# Patient Record
Sex: Female | Born: 1945 | Race: White | Hispanic: No | Smoking: Current every day smoker
Health system: Southern US, Community
[De-identification: ages and names within clinical notes are randomized; demographics above are authoritative.]

## PROBLEM LIST (undated history)

## (undated) DIAGNOSIS — J40 Bronchitis, not specified as acute or chronic: Secondary | ICD-10-CM

## (undated) DIAGNOSIS — I10 Essential (primary) hypertension: Secondary | ICD-10-CM

## (undated) DIAGNOSIS — D509 Iron deficiency anemia, unspecified: Secondary | ICD-10-CM

## (undated) DIAGNOSIS — E785 Hyperlipidemia, unspecified: Secondary | ICD-10-CM

## (undated) DIAGNOSIS — K269 Duodenal ulcer, unspecified as acute or chronic, without hemorrhage or perforation: Secondary | ICD-10-CM

## (undated) DIAGNOSIS — F419 Anxiety disorder, unspecified: Secondary | ICD-10-CM

## (undated) DIAGNOSIS — J449 Chronic obstructive pulmonary disease, unspecified: Secondary | ICD-10-CM

## (undated) DIAGNOSIS — I714 Abdominal aortic aneurysm, without rupture, unspecified: Secondary | ICD-10-CM

## (undated) DIAGNOSIS — K259 Gastric ulcer, unspecified as acute or chronic, without hemorrhage or perforation: Secondary | ICD-10-CM

## (undated) DIAGNOSIS — I712 Thoracic aortic aneurysm, without rupture, unspecified: Secondary | ICD-10-CM

## (undated) DIAGNOSIS — K529 Noninfective gastroenteritis and colitis, unspecified: Secondary | ICD-10-CM

## (undated) HISTORY — DX: Thoracic aortic aneurysm, without rupture, unspecified: I71.20

## (undated) HISTORY — PX: CAROTID ENDARTERECTOMY: SUR193

## (undated) HISTORY — DX: Essential (primary) hypertension: I10

## (undated) HISTORY — DX: Thoracic aortic aneurysm, without rupture: I71.2

## (undated) HISTORY — DX: Iron deficiency anemia, unspecified: D50.9

## (undated) HISTORY — DX: Hyperlipidemia, unspecified: E78.5

---

## 2002-07-24 ENCOUNTER — Emergency Department (HOSPITAL_COMMUNITY): Admission: EM | Admit: 2002-07-24 | Discharge: 2002-07-24 | Payer: Self-pay | Admitting: Emergency Medicine

## 2002-11-02 ENCOUNTER — Ambulatory Visit (HOSPITAL_COMMUNITY): Admission: RE | Admit: 2002-11-02 | Discharge: 2002-11-02 | Payer: Self-pay | Admitting: Unknown Physician Specialty

## 2002-11-21 ENCOUNTER — Inpatient Hospital Stay (HOSPITAL_COMMUNITY): Admission: RE | Admit: 2002-11-21 | Discharge: 2002-11-22 | Payer: Self-pay | Admitting: Vascular Surgery

## 2002-11-21 ENCOUNTER — Encounter (INDEPENDENT_AMBULATORY_CARE_PROVIDER_SITE_OTHER): Payer: Self-pay | Admitting: *Deleted

## 2003-01-08 ENCOUNTER — Emergency Department (HOSPITAL_COMMUNITY): Admission: EM | Admit: 2003-01-08 | Discharge: 2003-01-09 | Payer: Self-pay | Admitting: Emergency Medicine

## 2003-06-30 ENCOUNTER — Emergency Department (HOSPITAL_COMMUNITY): Admission: EM | Admit: 2003-06-30 | Discharge: 2003-06-30 | Payer: Self-pay | Admitting: Emergency Medicine

## 2003-11-12 ENCOUNTER — Ambulatory Visit: Payer: Self-pay | Admitting: Family Medicine

## 2004-01-13 ENCOUNTER — Ambulatory Visit: Payer: Self-pay | Admitting: Family Medicine

## 2004-01-20 ENCOUNTER — Ambulatory Visit: Payer: Self-pay | Admitting: Family Medicine

## 2004-01-27 ENCOUNTER — Ambulatory Visit: Payer: Self-pay | Admitting: Family Medicine

## 2004-02-03 ENCOUNTER — Ambulatory Visit: Payer: Self-pay | Admitting: Family Medicine

## 2004-02-10 ENCOUNTER — Ambulatory Visit: Payer: Self-pay | Admitting: Family Medicine

## 2004-03-09 ENCOUNTER — Ambulatory Visit: Payer: Self-pay | Admitting: Family Medicine

## 2004-03-20 ENCOUNTER — Ambulatory Visit: Payer: Self-pay | Admitting: Family Medicine

## 2004-03-27 ENCOUNTER — Ambulatory Visit: Payer: Self-pay | Admitting: Family Medicine

## 2004-04-03 ENCOUNTER — Ambulatory Visit: Payer: Self-pay | Admitting: Family Medicine

## 2004-04-09 ENCOUNTER — Ambulatory Visit: Payer: Self-pay | Admitting: Family Medicine

## 2004-04-14 ENCOUNTER — Ambulatory Visit: Payer: Self-pay | Admitting: Family Medicine

## 2004-04-28 ENCOUNTER — Ambulatory Visit: Payer: Self-pay | Admitting: Family Medicine

## 2004-05-08 ENCOUNTER — Ambulatory Visit: Payer: Self-pay | Admitting: Family Medicine

## 2004-05-11 ENCOUNTER — Ambulatory Visit: Payer: Self-pay | Admitting: Family Medicine

## 2004-06-02 ENCOUNTER — Ambulatory Visit: Payer: Self-pay | Admitting: Family Medicine

## 2004-06-09 ENCOUNTER — Ambulatory Visit: Payer: Self-pay | Admitting: Family Medicine

## 2004-07-09 ENCOUNTER — Ambulatory Visit: Payer: Self-pay | Admitting: Family Medicine

## 2004-08-10 ENCOUNTER — Ambulatory Visit: Payer: Self-pay | Admitting: Family Medicine

## 2004-08-24 ENCOUNTER — Ambulatory Visit: Payer: Self-pay | Admitting: Family Medicine

## 2004-08-31 ENCOUNTER — Ambulatory Visit (HOSPITAL_COMMUNITY): Admission: RE | Admit: 2004-08-31 | Discharge: 2004-08-31 | Payer: Self-pay | Admitting: Family Medicine

## 2004-09-10 ENCOUNTER — Ambulatory Visit: Payer: Self-pay | Admitting: Family Medicine

## 2004-09-30 ENCOUNTER — Ambulatory Visit: Payer: Self-pay | Admitting: Family Medicine

## 2004-10-09 ENCOUNTER — Ambulatory Visit: Payer: Self-pay | Admitting: Family Medicine

## 2004-11-09 ENCOUNTER — Ambulatory Visit: Payer: Self-pay | Admitting: Family Medicine

## 2004-12-09 ENCOUNTER — Ambulatory Visit: Payer: Self-pay | Admitting: Family Medicine

## 2005-01-11 ENCOUNTER — Ambulatory Visit: Payer: Self-pay | Admitting: Family Medicine

## 2005-02-11 ENCOUNTER — Ambulatory Visit: Payer: Self-pay | Admitting: Family Medicine

## 2005-03-11 ENCOUNTER — Ambulatory Visit: Payer: Self-pay | Admitting: Family Medicine

## 2005-04-13 ENCOUNTER — Ambulatory Visit: Payer: Self-pay | Admitting: Family Medicine

## 2005-04-28 ENCOUNTER — Ambulatory Visit: Payer: Self-pay | Admitting: Family Medicine

## 2005-05-04 ENCOUNTER — Ambulatory Visit: Payer: Self-pay | Admitting: Family Medicine

## 2005-05-10 ENCOUNTER — Ambulatory Visit: Payer: Self-pay | Admitting: Family Medicine

## 2005-05-17 ENCOUNTER — Ambulatory Visit: Payer: Self-pay | Admitting: Family Medicine

## 2005-05-24 ENCOUNTER — Ambulatory Visit: Payer: Self-pay | Admitting: Family Medicine

## 2005-05-26 ENCOUNTER — Ambulatory Visit: Payer: Self-pay | Admitting: Family Medicine

## 2005-06-01 ENCOUNTER — Ambulatory Visit: Payer: Self-pay | Admitting: Family Medicine

## 2005-06-08 ENCOUNTER — Encounter: Admission: RE | Admit: 2005-06-08 | Discharge: 2005-06-17 | Payer: Self-pay | Admitting: Orthopaedic Surgery

## 2005-06-22 ENCOUNTER — Ambulatory Visit: Payer: Self-pay | Admitting: Internal Medicine

## 2005-06-30 ENCOUNTER — Encounter (INDEPENDENT_AMBULATORY_CARE_PROVIDER_SITE_OTHER): Payer: Self-pay | Admitting: *Deleted

## 2005-06-30 ENCOUNTER — Ambulatory Visit: Payer: Self-pay | Admitting: Internal Medicine

## 2005-06-30 ENCOUNTER — Ambulatory Visit (HOSPITAL_COMMUNITY): Admission: RE | Admit: 2005-06-30 | Discharge: 2005-06-30 | Payer: Self-pay | Admitting: Internal Medicine

## 2005-07-08 ENCOUNTER — Ambulatory Visit: Payer: Self-pay | Admitting: Family Medicine

## 2005-07-12 ENCOUNTER — Ambulatory Visit: Payer: Self-pay | Admitting: Family Medicine

## 2005-08-09 ENCOUNTER — Ambulatory Visit: Payer: Self-pay | Admitting: Family Medicine

## 2005-08-12 ENCOUNTER — Ambulatory Visit: Payer: Self-pay | Admitting: Family Medicine

## 2005-08-25 ENCOUNTER — Ambulatory Visit: Payer: Self-pay | Admitting: Family Medicine

## 2005-09-02 ENCOUNTER — Ambulatory Visit: Payer: Self-pay | Admitting: Family Medicine

## 2005-09-09 ENCOUNTER — Ambulatory Visit: Payer: Self-pay | Admitting: Family Medicine

## 2005-09-16 ENCOUNTER — Ambulatory Visit: Payer: Self-pay | Admitting: Family Medicine

## 2005-10-18 ENCOUNTER — Ambulatory Visit: Payer: Self-pay | Admitting: Family Medicine

## 2005-11-08 ENCOUNTER — Ambulatory Visit: Payer: Self-pay | Admitting: Physician Assistant

## 2005-11-17 ENCOUNTER — Ambulatory Visit: Payer: Self-pay | Admitting: Physician Assistant

## 2005-12-21 ENCOUNTER — Ambulatory Visit: Payer: Self-pay | Admitting: Family Medicine

## 2005-12-23 ENCOUNTER — Ambulatory Visit: Payer: Self-pay | Admitting: Family Medicine

## 2006-01-25 ENCOUNTER — Ambulatory Visit: Payer: Self-pay | Admitting: Family Medicine

## 2006-02-14 ENCOUNTER — Ambulatory Visit: Payer: Self-pay | Admitting: Family Medicine

## 2006-02-22 ENCOUNTER — Ambulatory Visit: Payer: Self-pay | Admitting: Family Medicine

## 2006-02-25 ENCOUNTER — Ambulatory Visit: Payer: Self-pay | Admitting: Family Medicine

## 2006-03-30 ENCOUNTER — Ambulatory Visit: Payer: Self-pay | Admitting: Family Medicine

## 2006-05-03 ENCOUNTER — Ambulatory Visit: Payer: Self-pay | Admitting: Family Medicine

## 2006-06-03 ENCOUNTER — Ambulatory Visit: Payer: Self-pay | Admitting: Family Medicine

## 2006-11-01 ENCOUNTER — Ambulatory Visit: Payer: Self-pay | Admitting: Vascular Surgery

## 2010-05-19 NOTE — Procedures (Signed)
CAROTID DUPLEX EXAM   INDICATION:  Followup of known carotid artery disease.  Patient is  asymptomatic.   HISTORY:  Diabetes:  No.  Cardiac:  Heart murmur.  Hypertension:  Yes.  Smoking:  Yes, one pack per day.  Previous Surgery:  Right CEA on 11/21/02 by Dr. Hart Rochester.  CV History:  Amaurosis Fugax No, Paresthesias No, Hemiparesis No                                       RIGHT             LEFT  Brachial systolic pressure:         164               160  Brachial Doppler waveforms:         WNL               WNL  Vertebral direction of flow:        Antegrade         Antegrade  DUPLEX VELOCITIES (cm/sec)  CCA peak systolic                   63                70  ECA peak systolic                   73                253  ICA peak systolic                   83                240  ICA end diastolic                   26                57  PLAQUE MORPHOLOGY:                  Intimal thickening                  Mixed calcific  PLAQUE AMOUNT:                      Minimal           Moderate-to-severe  PLAQUE LOCATION:                    Bifurcation, ICA  Bifurcation, ICA,  ECA   IMPRESSION:  1. Right ICA, status post CEA, without recurrent stenosis.  2. 60-79% left internal carotid artery stenosis.  3. Left external carotid artery stenosis.  4. Study essentially unchanged from that on 10/19/2005.   ___________________________________________  Quita Skye. Hart Rochester, M.D.   PB/MEDQ  D:  11/01/2006  T:  11/01/2006  Job:  [id number]

## 2010-05-22 NOTE — Discharge Summary (Signed)
NAME:  Jackie Walls, Jackie Walls                         ACCOUNT NO.:  [id number]   MEDICAL RECORD NO.:  [id number]                   PATIENT TYPE:  INP   LOCATION:  3303                                 FACILITY:  [facility]   PHYSICIAN:  Quita Skye. Hart Rochester, M.D.               DATE OF BIRTH:  22-Jun-1945   DATE OF ADMISSION:  11/21/2002  DATE OF DISCHARGE:  11/22/2002                                 DISCHARGE SUMMARY   DIAGNOSIS:  Right carotid stenosis.   PAST MEDICAL HISTORY:  1. Hypertension.  2. Hyperlipidemia.  3. History of claudication.  4. Carotid stenosis.   Patient denies any history of diabetes mellitus, myocardial infarction, or  stroke.   ALLERGIES:  No known drug allergies.   DISCHARGE DIAGNOSIS:  Right carotid stenosis, status post right carotid  endarterectomy with Dacron patch angioplasty.   BRIEF HISTORY:  This is a 65 year old female with a history of carotid bruit  which was further evaluated with a carotid duplex exam.  Carotid duplex  revealed an 80-90% right internal carotid artery stenosis and a 60-79% left  internal carotid artery stenosis.  Patient had been complaining of neck and  back pain at the time of evaluation.  Patient denied any neurological  symptoms such as amaurosis fugax, previous CVA, TIA, diplopia, blurred  vision, or syncope.  The patient had been seen and evaluated by Dr. Hart Rochester,  who felt that she should proceed with a right carotid endarterectomy.   HOSPITAL COURSE:  Patient was admitted and taken to the OR on November 21, 2002, for a right carotid endarterectomy with Dacron patch angioplasty.  Patient tolerated the procedure well.  Patient was hemodynamically stable  immediately postoperatively and was extubated without problems.  The patient  woke up from anesthesia neurologically intact.  Patient's postoperative  course was uneventful, and she progressed rapidly.  On the date of  discharge, postoperative day #1, the patient was without  complaints.  She  was afebrile.  Vital signs were stable.   PHYSICAL EXAMINATION:  CARDIAC:  Regular rate and rhythm.  LUNGS:  Clear to auscultation bilaterally.  ABDOMEN:  Soft, nontender, nondistended.  There were bowel sounds present.  NEUROLOGIC:  Patient was intact.  SKIN:  The incision was clean, dry, and intact.  There was no evidence of  hematoma or infection.   The patient has progressed well and is felt to be stable for discharge at  this time.  She is able to eat and ambulate without problems.  The patient  did have a hypokalemia, and this was replaced on the date of surgery with IV  potassium and then on the date of discharge with oral potassium.   LABORATORIES:  CBC on November 22, 2002:  White count 7.4, hemoglobin 10.2,  hematocrit 29.5.  Platelets 444.  BMP on November 22, 2002:  Sodium 137,  potassium 3.6, BUN 5, creatinine 0.8, glucose 92.   CONDITION  ON DISCHARGE:  Improved.   INSTRUCTIONS:  Patient is to resume her home medications.  She was given a  prescription for Tylox to be taken 1-2 p.o. q.4-6h. p.r.n. pain.   ACTIVITY:  No heavy lifting, pulling, or pushing.  No strenuous activity.  She is to refrain from driving until followup with Dr. Hart Rochester.  Patient is  to continue daily breathing and walking exercises.   DIET:  Low fat, low cholesterol, and low sodium.   WOUND CARE:  Patient may shower daily.  She is to clean the incisions with  soap and water and do not put creams, lotions, or any ointments on the  incisions.  The patient is to notify [facility] if she experiences any redness,  swelling, or drainage from the incision or if she experiences a fever of 101  degrees Fahrenheit.   FOLLOW UP:  Patient has a follow-up appointment with Dr. Hart Rochester at the [facility]  office on December 11, 2002 at 9:30 a.m.      Pecola Leisure, PA                      Quita Skye. Hart Rochester, M.D.    AY/MEDQ  D:  11/22/2002  T:  11/23/2002  Job:  [id number]   cc:   Colon Flattery, MD   [address]  [place]  [place] [address]  Fax: [phone]

## 2010-05-22 NOTE — H&P (Signed)
NAME:  Jackie Walls, Jackie Walls                         ACCOUNT NO.:  [id number]   MEDICAL RECORD NO.:  [id number]                   PATIENT TYPE:  INP   LOCATION:  NA                                   FACILITY:  [facility]   PHYSICIAN:  Quita Skye. Hart Rochester, M.D.               DATE OF BIRTH:  [birth year]   DATE OF ADMISSION:  DATE OF DISCHARGE:                                HISTORY & PHYSICAL   HISTORY OF PRESENT ILLNESS:  This is a 65 year old female with a history of  carotid bruit which was further evaluated with a carotid duplex exam.  This  exam showed an 80-90% right internal carotid stenosis and a 60-79% left  internal carotid stenosis.  The patient complained of neck and back pain at  that time.  She denied any neurologic symptoms, specifically, amaurosis  fugax, previous CVA, TIA, diplopia, blurred vision, or syncope.  The patient  has been seen and evaluated by Dr. Hart Rochester, who discussed the risks,  benefits, and complications of carotid endarterectomy.  Patient was approved  for Medicaid and wished to proceed for surgical repair of her vascular  disease.  Patient presents today for preoperative history and physical  assessment.   DRUG SENSITIVITIES:  No known drug allergies.   CURRENT MEDICATIONS:  1. Accuretic 20/25 mg p.o. daily.  2. Lipitor 20 mg p.o. daily.  3. Aspirin 325 mg p.o. daily.  4. Combivent inhaler p.r.n.   PAST MEDICAL HISTORY:  1. Hypertension.  2. Hyperlipidemia.  3. History of claudication.  4. Patient specifically denies any history of diabetes mellitus, MI, stroke.   SOCIAL HISTORY:  The patient worked in the [employer] in the past but  currently is not working.  She is married and has five children, of which  the youngest is 3 years of age.  The patient has a 37-pack-year history of  smoking, which she is currently continuing.  The patient denies any history  of alcohol use.   FAMILY HISTORY:  Noncontributory.   PERTINENT REVIEW OF SYSTEMS:  At one  point, review of systems was completed  and positive for the following:  HEENT:  The patient admits to occasional  headaches and dizziness which has been long term or transient.  The patient  wear glasses.  The patient has a history of sinusitis in the past.  MUSCULOSKELETAL:  The patient admits to arthritis in her knees and her back  in addition to night cramps in her calves.  CARDIOVASCULAR:  The patient  admits to claudication for several years.  PULMONARY:  The patient admits to  shortness of breath, occasional wheezes, and cough, which is mostly in the  morning.  The patient specifically denies any cough, fever, chills, night  sweats, weight loss, or other cold-like symptoms.  The patient denies any  diarrhea or constipation.  The patient denies any urinary frequency,  urgency, or dysuria.  The patient denies  any hematochezia or melena.   PHYSICAL EXAMINATION:  GENERAL:  This is a well-developed, well-nourished  female who appears older than stated age.  VITAL SIGNS:  Blood pressure in her right arm is 178/98.  Blood pressure in  her left arm is 168/90.  Her pulse is 90 and regular.  Respirations are 20  and unlabored.  Weight is 134 pounds.  She is 5'7.  SKIN:  Thin with fair turgor.  HEENT:  EOMI.  Conjunctivae are clear.  Sclerae are anicteric.  PERRLA.  Hearing is within normal limits.  Patient has no teeth in her upper palate  and has poor dentition in her lower teeth.  She has no history of wearing  dentures.  She has no dentures.  Patient has no oral sores or exudate.  CHEST:  The patient's lungs are clear to auscultation throughout without  wheezes, crackles, or rales.  She has no spinal or CVA tenderness.  HEART:  Regular rate and rhythm without murmur, rub or gallop.  She has a  normal S1 and S2.  She has a harsh carotid bruit bilaterally with the right  being greater than the left.  Her carotid pulses are 3+ bilaterally.  Radial  pulses are 3+ bilaterally.  Ulnar pulses  are 2+ bilaterally.  Her right  femoral pulse is nonpalpable as well as the remainder of her distal pulses  on the right.  Her left femoral pulse is 1+ with nonpalpable pulses in the  distal extremity.  She has minimal evidence of venous insufficiencies.  She  has no evidence of ischemia or ulceration in her lower extremities.  ABDOMEN:  Soft, nontender, nondistended with good bowel sounds.  She has a  harsh abdominal bruit and pulsation.  This is nonradiating.  She has no  organomegaly or masses.  SKELETAL/NEUROMUSCULAR:  She has 5/5 strength throughout in the upper and  lower extremities.  Her cranial nerves II-XII are intact.   IMPRESSION:  This is a 65 year old female with a long-term history of  smoking with severe right internal carotid artery stenosis and evidence of  an additional peripheral vascular disease.   PLAN:  We will continue as planned with a right carotid endarterectomy with  Dr. Hart Rochester on November 21, 2002.  We will address her other issues of  peripheral vascular disease at a later date.      Skyrah A. Eustaquio Boyden.                  Quita Skye Hart Rochester, M.D.    CAF/MEDQ  D:  11/19/2002  T:  11/19/2002  Job:  [id number]   cc:   Colon Flattery, MD  [address]  [place]  [place] [address]  Fax: [phone]

## 2010-05-22 NOTE — Consult Note (Signed)
NAME:  LAFERN, BRINKLEY NO.:  [id number]   MEDICAL RECORD NO.:  [id number]           PATIENT TYPE:   LOCATION:                                 FACILITY:   PHYSICIAN:  R. Roetta Sessions, M.D. DATE OF BIRTH:  1945/05/14   DATE OF CONSULTATION:  06/22/2005  DATE OF DISCHARGE:                                   CONSULTATION   CHIEF COMPLAINT:  Blood in stool, anemia and diarrhea, weight loss.   PHYSICIAN REQUESTING CONSULTATION:  Delaney Meigs, M.D.   HISTORY OF PRESENT ILLNESS:  Ms. Tsao is a 65 year old lady who presents  for further evaluation of the above-stated symptoms.  She says that she has  had diarrhea for 5-6 months.  She also has had anemia for several months as  well.  She is having anywhere from 3 to 4 loose postprandial stools daily.  She denies any nocturnal symptoms.  She never has a solid stool anymore.  She has intermittent left lower quadrant abdominal pain.  Her weight is down  14-15 pounds in the last few months.  Denies any melena or rectal bleeding,  but her stools are dark since she started taking iron almost a year ago.  She is on Coumadin for a history of peripheral vascular disease.  She is  status post right carotid endarterectomy, and her left carotid artery is 70%  blocked per her report.  She complains of early satiety and dysphagia to  pills.  She feels like she has a lump in her throat.  Denies any typical  heartburn symptoms.  She has intermittent vomiting, which is very occasional  in occurrence.  She has never had EGD or colonoscopy.   CURRENT MEDICATIONS:  1.  Benicar 40 mg daily.  2.  Lotrel 10/20 mg daily.  3.  Doxazosin 4 mg daily.  4.  Lipitor 80 mg daily.  5.  Coumadin 5 mg on Monday, Wednesday, Friday, and 10 mg other days.  6.  Fosamax 70 mg weekly.  7.  Hematin 3 daily.  8.  Benadryl p.r.n.   ALLERGIES:  CODEINE causes shortness of breath, NAPROSYN causes rash, and  SKELAXIN.   PAST MEDICAL HISTORY:  1.   Osteoporosis.  2.  Hypertension.  3.  Hypercholesterolemia.  4.  COPD.  5.  Chronic back pain.  6.  B12 deficiency on monthly injections.  7.  Status post right carotid endarterectomy.  8.  She reports history of heart murmur.   FAMILY HISTORY:  Her mother died of MI at age 71.  Father died of pneumonia  at age 27.  Negative for a family history of colorectal cancer.   SOCIAL HISTORY:  She is married, has 5 children.  She is a housewife.  She  smokes half a pack of cigarettes daily.  Denies any alcohol use.   REVIEW OF SYSTEMS:  GASTROINTESTINAL:  See HPI.  CONSTITUTIONAL:  See HPI.  CARDIOPULMONARY:  Denies any chest pain.  GENITOURINARY:  Denies any dysuria  or hematuria.   PHYSICAL EXAMINATION:  VITAL SIGNS:  Weight 135, height 5 feet 5 inches.  Temperature 98.3, blood pressure 142/88, pulse 74.  GENERAL:  A pleasant, thin, elderly Caucasian female in no acute distress.  SKIN:  Warm and dry, no jaundice.  HEENT:  Sclerae nonicteric.  Oropharyngeal mucosa moist and pink.  No  lesions, erythema or exudate.  Dentition in poor repair.  NECK:  No lymphadenopathy or thyromegaly.  CHEST:  Lungs are clear to auscultation.  CARDIAC:  Regular rate and rhythm, normal S1, S2.  Faint systolic ejection  murmur heard best at the apex.  ABDOMEN:  Positive bowel sounds, soft, nondistended.  She has mild left  lower quadrant tenderness to deep palpation.  No organomegaly or masses.  No  rebound tenderness or guarding.  She has abdominal bruits in both the left  and right midabdomen.  No abdominal hernias.  EXTREMITIES:  No edema.   LABORATORY DATA:  On April 28, 2005, he hemoglobin was 12.7, hematocrit  38.7, MCV 89.2.  Iron was 28, iron saturation was 8%, TIBC 362.  INR 2.6.  LFTs normal.  Methylmalonic acid was mildly elevated at 0.22.  Repeat CBC on  May 10, 2005, revealed a hemoglobin at 10.9, hematocrit 34.5.   IMPRESSION:  Ms. Acord is a 65 year old lady with chronic iron-deficiency   anemia in the setting of heme-positive stools and chronic diarrhea with  weight loss.  She is on chronic anticoagulation therapy for what she says is  peripheral vascular disease.  The scenario is somewhat worrisome.  Cannot  exclude the possibility of colorectal cancer, although with ongoing Fosamax  use esophageal pill dysphagia and early satiety, may also have esophageal  stricture, erosive esophagitis or even peptic ulcer disease.  Etiology of  chronic occult gastrointestinal bleeding needs to be determined.  She has  never had an EGD or colonoscopy.  Recommend one at this time.  She reports  that she generally holds her Coumadin for any procedures or dental work.   PLAN:  1.  CBC to see where we stand with her hemoglobin.  2.  EGD and colonoscopy with possible esophageal dilatation.  3.  Will hold her Coumadin for 4 days and iron 7 days to procedures.  4.  She will need SBE prophylaxis with antibiotic therapy.  5.  Not mentioned above, she also has abdominal bruits on exam.  She is not      sure whether she has had her renal arteries assessed previously given      her peripheral vascular disease.  Would like for her to address this      with Dr. Lysbeth Galas, or we can re-address after above workup is complete.      Tana Coast, P.AJonathon Bellows, M.D.  Electronically Signed    LL/MEDQ  D:  06/22/2005  T:  06/22/2005  Job:  [id number]   cc:   Delaney Meigs, M.D.  Fax: [phone]

## 2010-05-22 NOTE — Op Note (Signed)
NAME:  UZMA, HELLMER               ACCOUNT NO.:  [id number]   MEDICAL RECORD NO.:  [id number]           PATIENT TYPE:  AMB   LOCATION:                                FACILITY:  [facility]   PHYSICIAN:  R. Roetta Sessions, M.D. DATE OF BIRTH:  12-12-1945   DATE OF PROCEDURE:  06/30/2005  DATE OF DISCHARGE:                                 OPERATIVE REPORT   PROCEDURE:  Esophagogastroduodenoscopy with small-bowel biopsy followed by  colonoscopy with biopsy, and stool collection.   ENDOSCOPIST:  Gerrit Friends. Rourk, M.D.   INDICATIONS FOR PROCEDURE:  The patient is a 65 year old lady with  hemodynamic stool, iron-deficiency anemia, diarrhea and weight loss.  She  has been on anticoagulation and her Coumadin has been held for 5 days.  She  is undergoing EGD and colonoscopy.  She is status post vague esophageal  dysphagia to solids as well and takes Fosamax.  This approach has been  discussed with the patient at length.  The potential risks, benefits, and  alternatives have been reviewed; questions answered.  She is agreeable.  Please see the documentation in the medical record.   PROCEDURE NOTE:  O2 saturation, blood pressure, pulse, and respirations were  monitored throughout the entirety of both procedures.   CONSCIOUS SEDATION FOR BOTH PROCEDURES:  Versed 7 mg IV, Demerol 125 mg IV  in divided doses.   SBE PROPHYLAXIS:  Ampicillin 2 gm IV, gentamicin 90 mg IV prior to the  procedures.   INSTRUMENT:  Olympus video chip system.   FINDINGS:  EGD examination of the tubular esophagus revealed an area of  distal esophageal erosion with some depth consistent with ulceration.  It  was friable, but not bleeding.  There was also a Schatzki ring at the same  level.  The esophageal mucosa, otherwise, appeared normal.  The EG junction  was easily traversed.   STOMACH:  The gastric cavity was empty.  It insufflated well with air.  A  thorough examination of the gastric mucosa including a retroflex view  of the  proximal stomach and esophagogastric junction demonstrated a small hiatal  hernia.  The pylorus was patent and easily traversed.  Examination of the bulb, second, and third portions of the duodenum revealed  no mucosal abnormalities.   THERAPEUTIC/DIAGNOSTIC MANEUVERS PERFORMED:  A 56 French Maloney dilator was  passed to full insertion.  A look back revealed no apparent complication  related to passage of the dilator.  Subsequent biopsy of the second and  third portion of the duodenum were taken for histologic study.  The patient  tolerated the procedure and was prepared for coloscopy.  A digital rectal  exam revealed no abnormalities.   ENDOSCOPIC FINDINGS:  The prep was adequate.   RECTUM:  Examination of the rectal mucosa including the retroflex view of  the anal verge revealed only internal hemorrhoids.   COLON:  The colonic mucosa was surveyed from the rectosigmoid junction  through the left transverse and right colon to the area of the appendiceal  orifice, ileocecal valve, and cecum.  These structures were well seen and  photographed for the record.  The terminal ileum was intubated to 5 cm.   From this level the scope was slowly withdrawn.  All previously mentioned  mucosal surfaces were again seen.  The colonic mucosa was well seen and  appeared normal except for a 1 cm friable, stellate shaped ulcer at the  ileocecal valve.  Please see photos. It had a benign appearance. The  terminal ileum mucosa, itself, appeared entirely normal.  The ileocecal  valve ulcer was biopsied for histologic study.  Also biopsies of the sigmoid  colon were taken to rule out microscopic colitis.  Otherwise, the colonic  mucosa appeared normal.  The patient tolerated both procedures well and was  reacted in endoscopy.   EGD IMPRESSION:  1.  Distal esophageal erosion, ulceration consistent with erosive reflux      esophagitis component of Fosamax-induce injury not excluded.  2.   Schatzki ring, status post dilation.  3.  Small hiatal hernia, otherwise normal stomach, D1-D3.  4.  Status post biopsies D2-D3.   COLONOSCOPY FINDINGS:  1.  Internal hemorrhoids, otherwise normal rectum.  2.  The colon appeared normal except for a 1 cm stellate-shaped ulcer at      ileocecal valve, biopsied.  3.  Normal terminal ileum.   I am suspicious the lesion in the ileocecal valve is related to Fosamax and  also concerned about the inflammatory process at the EG junction.   RECOMMENDATIONS:  1.  On the basis of the distal esophageal lesion alone, I have recommended      that the Fosamax be stopped immediately.  Will begin Aciphex 20 mg      orally daily.  2.  Followup path not mentioned above.  3.  Stool sample collected for microbiology studies given her history of      diarrhea.  It is notable that __________ may be related to her diarrhea      as well.  4.  Antireflux literature provided to Ms. Baldonado.  5.  I have advised her that she may resume her Coumadin tomorrow.  6.  Further recommendations to follow.      Jonathon Bellows, M.D.  Electronically Signed     RMR/MEDQ  D:  06/30/2005  T:  06/30/2005  Job:  [id number]   cc:   Delaney Meigs, M.D.  Fax: [phone]

## 2010-05-22 NOTE — Op Note (Signed)
NAME:  Jackie Walls, Jackie Walls                         ACCOUNT NO.:  [id number]   MEDICAL RECORD NO.:  [id number]                   PATIENT TYPE:  INP   LOCATION:  3303                                 FACILITY:  [facility]   PHYSICIAN:  Quita Skye. Hart Rochester, M.D.               DATE OF BIRTH:  05/13/1945   DATE OF PROCEDURE:  11/21/2002  DATE OF DISCHARGE:                                 OPERATIVE REPORT   PREOPERATIVE DIAGNOSIS:  Severe right internal carotid artery stenosis,  asymptomatic.   POSTOPERATIVE DIAGNOSIS:  Severe right internal carotid artery stenosis,  asymptomatic.   OPERATION PERFORMED:  Right carotid endarterectomy with Dacron patch  angioplasty.   SURGEON:  Quita Skye. Hart Rochester, M.D.   ASSISTANT:  Jerold Coombe, P.A.   ANESTHESIA:  General endotracheal.   INDICATIONS FOR PROCEDURE:  The patient was found to have a carotid bruit  and duplex scanning revealed a severe right internal carotid artery stenosis  approximating 80% with mild disease in the contralateral left side.  She had  no history of stroke or transient ischemic attacks.  She was scheduled for  right carotid endarterectomy for this asymptomatic lesion for prevention of  stroke.   DESCRIPTION OF PROCEDURE:  The patient was taken to the operating room and  placed in the supine position at which time satisfactory general  endotracheal anesthesia was administered.  The right neck was prepped with  Betadine scrub and solution and draped in routine sterile manner.  An  incision was made along the anterior border of the sternocleidomastoid  muscle and carried down through subcutaneous tissue and platysma using the  Bovie.  The common facial vein, external jugular veins were ligated with 3-0  silk ties and divided exposing the common, internal and external carotid  arteries.  Care was taken not to injure the vagus or hypoglossal nerves both  of which were exposed.  There was a calcified atherosclerotic plaque at the  carotid bifurcation extending up the internal carotid artery about 3 to 4cm.  There was an aberrant branch arising from the internal carotid artery about  3 cm distal to the origin right where the plaque terminated.  This arose  lateral to the internal carotid artery and was of fairly significant size  and was preserved.  The plaque extended down the common carotid artery a  fairly long distance and was dissected free down proximal to the crossing of  the omohyoid muscle.  A #10 shunt was prepared and the patient was  heparinized.  Carotid vessels were occluded with vascular clamps.  Longitudinal opening made in the common carotid with a 15 blade extended at  the internal carotid artery with Potts scissors to a point distal to the  disease.  Plaque was mildly ulcerated and about 80% stenotic. The plaque  proximally was still present in the common carotid artery although it was  not causing any significant stenosis.  There  was some significant thickening  of the vessel in the proximal extent of the arteriotomy.  A #10 shunt was  inserted without difficulty, re-establishing flow in about two minutes.  A  standard endarterectomy was then performed using the elevator and the Pott's  scissors with eversion endarterectomy of the external carotid. The plaque  feathered off the distal internal carotid artery nicely.  A few tacking  sutures were placed adjacent to the origin of this small branch to tack down  the distal intima.  A Dacron patch was then sewn into place and prior to  completion of this, the shunt was removed and following appropriate  flushing, the closure was completed to re-establish flow at the external and  at the internal branch.  The carotid was  occluded for less than two minutes on removal of the shunt.  Protamine was  then given to reverse the heparin.  Following adequate hemostasis the wound  was irrigated with saline and closed in layers of Vicryl in a subcuticular   fashion.  Sterile dressing was applied.  The patient was taken to the  recovery room in satisfactory condition.                                               Quita Skye Hart Rochester, M.D.    JDL/MEDQ  D:  11/21/2002  T:  11/22/2002  Job:  [id number]

## 2010-10-29 ENCOUNTER — Encounter: Payer: Self-pay | Admitting: Internal Medicine

## 2010-10-30 ENCOUNTER — Institutional Professional Consult (permissible substitution): Payer: Self-pay | Admitting: Internal Medicine

## 2012-01-29 ENCOUNTER — Inpatient Hospital Stay (HOSPITAL_COMMUNITY)
Admission: EM | Admit: 2012-01-29 | Discharge: 2012-01-31 | DRG: 194 | Disposition: A | Discharge status: Home / Self Care | Payer: Medicare Other | Attending: Internal Medicine | Admitting: Internal Medicine

## 2012-01-29 ENCOUNTER — Emergency Department (HOSPITAL_COMMUNITY): Payer: Medicare Other

## 2012-01-29 ENCOUNTER — Encounter (HOSPITAL_COMMUNITY): Payer: Self-pay | Admitting: *Deleted

## 2012-01-29 DIAGNOSIS — E785 Hyperlipidemia, unspecified: Secondary | ICD-10-CM | POA: Insufficient documentation

## 2012-01-29 DIAGNOSIS — Z23 Encounter for immunization: Secondary | ICD-10-CM

## 2012-01-29 DIAGNOSIS — Z79899 Other long term (current) drug therapy: Secondary | ICD-10-CM

## 2012-01-29 DIAGNOSIS — J309 Allergic rhinitis, unspecified: Secondary | ICD-10-CM | POA: Diagnosis present

## 2012-01-29 DIAGNOSIS — J189 Pneumonia, unspecified organism: Secondary | ICD-10-CM

## 2012-01-29 DIAGNOSIS — I10 Essential (primary) hypertension: Secondary | ICD-10-CM

## 2012-01-29 DIAGNOSIS — J4489 Other specified chronic obstructive pulmonary disease: Secondary | ICD-10-CM | POA: Diagnosis present

## 2012-01-29 DIAGNOSIS — T502X5A Adverse effect of carbonic-anhydrase inhibitors, benzothiadiazides and other diuretics, initial encounter: Secondary | ICD-10-CM | POA: Diagnosis present

## 2012-01-29 DIAGNOSIS — J40 Bronchitis, not specified as acute or chronic: Secondary | ICD-10-CM | POA: Insufficient documentation

## 2012-01-29 DIAGNOSIS — J449 Chronic obstructive pulmonary disease, unspecified: Secondary | ICD-10-CM

## 2012-01-29 DIAGNOSIS — E871 Hypo-osmolality and hyponatremia: Secondary | ICD-10-CM | POA: Diagnosis present

## 2012-01-29 HISTORY — DX: Bronchitis, not specified as acute or chronic: J40

## 2012-01-29 LAB — BASIC METABOLIC PANEL
BUN: 17 mg/dL (ref 6–23)
CO2: 24 mEq/L (ref 19–32)
Calcium: 9.1 mg/dL (ref 8.4–10.5)
Chloride: 92 mEq/L — ABNORMAL LOW (ref 96–112)
Creatinine, Ser: 0.95 mg/dL (ref 0.50–1.10)
GFR calc Af Amer: 71 mL/min — ABNORMAL LOW (ref >90)
GFR calc non Af Amer: 61 mL/min — ABNORMAL LOW (ref >90)
Glucose, Bld: 102 mg/dL — ABNORMAL HIGH (ref 70–99)
Potassium: 3 mEq/L — ABNORMAL LOW (ref 3.5–5.1)
Sodium: 128 mEq/L — ABNORMAL LOW (ref 135–145)

## 2012-01-29 LAB — CBC WITH DIFFERENTIAL/PLATELET
Basophils Absolute: 0 10*3/uL (ref 0.0–0.1)
Basophils Relative: 0 % (ref 0–1)
Eosinophils Absolute: 0 10*3/uL (ref 0.0–0.7)
Eosinophils Relative: 0 % (ref 0–5)
HCT: 35.8 % — ABNORMAL LOW (ref 36.0–46.0)
Hemoglobin: 12.7 g/dL (ref 12.0–15.0)
Lymphocytes Relative: 7 % — ABNORMAL LOW (ref 12–46)
Lymphs Abs: 0.9 10*3/uL (ref 0.7–4.0)
MCH: 32.5 pg (ref 26.0–34.0)
MCHC: 35.5 g/dL (ref 30.0–36.0)
MCV: 91.6 fL (ref 78.0–100.0)
Monocytes Absolute: 0.7 10*3/uL (ref 0.1–1.0)
Monocytes Relative: 6 % (ref 3–12)
Neutro Abs: 10.6 10*3/uL — ABNORMAL HIGH (ref 1.7–7.7)
Neutrophils Relative %: 87 % — ABNORMAL HIGH (ref 43–77)
Platelets: 314 10*3/uL (ref 150–400)
RBC: 3.91 MIL/uL (ref 3.87–5.11)
RDW: 12.7 % (ref 11.5–15.5)
WBC: 12.2 10*3/uL — ABNORMAL HIGH (ref 4.0–10.5)

## 2012-01-29 LAB — CBC
HCT: 35.6 % — ABNORMAL LOW (ref 36.0–46.0)
MCHC: 35.4 g/dL (ref 30.0–36.0)
MCV: 91.8 fL (ref 78.0–100.0)
RDW: 12.8 % (ref 11.5–15.5)

## 2012-01-29 LAB — INFLUENZA PANEL BY PCR (TYPE A & B)
Influenza A By PCR: NEGATIVE
Influenza B By PCR: NEGATIVE

## 2012-01-29 LAB — D-DIMER, QUANTITATIVE: D-Dimer, Quant: 2.89 ug/mL-FEU — ABNORMAL HIGH (ref 0.00–0.48)

## 2012-01-29 MED ORDER — POTASSIUM CHLORIDE CRYS ER 20 MEQ PO TBCR
40.0000 meq | EXTENDED_RELEASE_TABLET | ORAL | Status: AC
  Administered 2012-01-29 – 2012-01-30 (×3): 40 meq via ORAL
  Filled 2012-01-29 (×3): qty 2

## 2012-01-29 MED ORDER — DEXTROSE 5 % IV SOLN
1.0000 g | INTRAVENOUS | Status: DC
  Administered 2012-01-30: 1 g via INTRAVENOUS
  Filled 2012-01-29 (×2): qty 10

## 2012-01-29 MED ORDER — BUDESONIDE-FORMOTEROL FUMARATE 160-4.5 MCG/ACT IN AERO
2.0000 | INHALATION_SPRAY | Freq: Two times a day (BID) | RESPIRATORY_TRACT | Status: DC
  Administered 2012-01-29 – 2012-01-31 (×4): 2 via RESPIRATORY_TRACT
  Filled 2012-01-29: qty 6

## 2012-01-29 MED ORDER — ALBUTEROL SULFATE (5 MG/ML) 0.5% IN NEBU: 2.5000 mg | INHALATION_SOLUTION | Freq: Three times a day (TID) | RESPIRATORY_TRACT | Status: DC

## 2012-01-29 MED ORDER — FLUTICASONE PROPIONATE 50 MCG/ACT NA SUSP
2.0000 | Freq: Every day | NASAL | Status: DC
  Administered 2012-01-29 – 2012-01-31 (×3): 2 via NASAL
  Filled 2012-01-29: qty 16

## 2012-01-29 MED ORDER — AZITHROMYCIN 250 MG PO TABS
500.0000 mg | ORAL_TABLET | Freq: Once | ORAL | Status: AC
  Administered 2012-01-29: 500 mg via ORAL
  Filled 2012-01-29: qty 2

## 2012-01-29 MED ORDER — SODIUM CHLORIDE 0.9 % IV SOLN
INTRAVENOUS | Status: DC
  Filled 2012-01-29 (×3): qty 1000

## 2012-01-29 MED ORDER — HYDROCODONE-ACETAMINOPHEN 5-325 MG PO TABS
1.0000 | ORAL_TABLET | Freq: Once | ORAL | Status: AC
  Administered 2012-01-29: 1 via ORAL
  Filled 2012-01-29: qty 1

## 2012-01-29 MED ORDER — ALBUTEROL SULFATE (5 MG/ML) 0.5% IN NEBU
5.0000 mg | INHALATION_SOLUTION | RESPIRATORY_TRACT | Status: DC
  Administered 2012-01-29 – 2012-01-31 (×12): 5 mg via RESPIRATORY_TRACT
  Filled 2012-01-29 (×3): qty 1
  Filled 2012-01-29: qty 0.5
  Filled 2012-01-29 (×2): qty 1
  Filled 2012-01-29: qty 0.5
  Filled 2012-01-29 (×2): qty 1
  Filled 2012-01-29: qty 0.5
  Filled 2012-01-29 (×2): qty 1
  Filled 2012-01-29: qty 0.5

## 2012-01-29 MED ORDER — HEPARIN SODIUM (PORCINE) 5000 UNIT/ML IJ SOLN
5000.0000 [IU] | Freq: Three times a day (TID) | INTRAMUSCULAR | Status: DC
  Administered 2012-01-29 – 2012-01-31 (×5): 5000 [IU] via SUBCUTANEOUS
  Filled 2012-01-29 (×5): qty 1

## 2012-01-29 MED ORDER — DEXTROSE 5 % IV SOLN
1.0000 g | Freq: Once | INTRAVENOUS | Status: AC
  Administered 2012-01-29: 1 g via INTRAVENOUS
  Filled 2012-01-29: qty 10

## 2012-01-29 MED ORDER — PANTOPRAZOLE SODIUM 40 MG PO TBEC
40.0000 mg | DELAYED_RELEASE_TABLET | Freq: Every day | ORAL | Status: DC
  Administered 2012-01-29 – 2012-01-31 (×3): 40 mg via ORAL
  Filled 2012-01-29 (×3): qty 1

## 2012-01-29 MED ORDER — DEXTROSE 5 % IV SOLN
500.0000 mg | INTRAVENOUS | Status: DC
  Administered 2012-01-30: 500 mg via INTRAVENOUS
  Filled 2012-01-29 (×2): qty 500

## 2012-01-29 MED ORDER — TIOTROPIUM BROMIDE MONOHYDRATE 18 MCG IN CAPS
18.0000 ug | ORAL_CAPSULE | Freq: Every day | RESPIRATORY_TRACT | Status: DC
  Administered 2012-01-30 – 2012-01-31 (×2): 18 ug via RESPIRATORY_TRACT
  Filled 2012-01-29: qty 5

## 2012-01-29 MED ORDER — DOXYCYCLINE HYCLATE 100 MG PO TABS
100.0000 mg | ORAL_TABLET | Freq: Two times a day (BID) | ORAL | Status: DC
  Administered 2012-01-29 – 2012-01-31 (×4): 100 mg via ORAL
  Filled 2012-01-29 (×4): qty 1

## 2012-01-29 MED ORDER — LISINOPRIL 10 MG PO TABS
20.0000 mg | ORAL_TABLET | Freq: Every day | ORAL | Status: DC
  Administered 2012-01-29 – 2012-01-31 (×3): 20 mg via ORAL
  Filled 2012-01-29 (×3): qty 2

## 2012-01-29 MED ORDER — AMLODIPINE BESYLATE 5 MG PO TABS
5.0000 mg | ORAL_TABLET | Freq: Every day | ORAL | Status: DC
  Administered 2012-01-29 – 2012-01-31 (×3): 5 mg via ORAL
  Filled 2012-01-29 (×3): qty 1

## 2012-01-29 MED ORDER — DOXAZOSIN MESYLATE 2 MG PO TABS
4.0000 mg | ORAL_TABLET | Freq: Every day | ORAL | Status: DC
  Administered 2012-01-29 – 2012-01-30 (×2): 4 mg via ORAL
  Filled 2012-01-29 (×2): qty 2

## 2012-01-29 MED ORDER — INFLUENZA VIRUS VACC SPLIT PF IM SUSP
0.5000 mL | INTRAMUSCULAR | Status: AC
  Administered 2012-01-30: 0.5 mL via INTRAMUSCULAR
  Filled 2012-01-29: qty 0.5

## 2012-01-29 MED ORDER — POTASSIUM CHLORIDE IN NACL 20-0.9 MEQ/L-% IV SOLN
INTRAVENOUS | Status: DC
  Administered 2012-01-29 – 2012-01-30 (×2): via INTRAVENOUS

## 2012-01-29 MED ORDER — DEXTROSE 5 % IV SOLN
INTRAVENOUS | Status: AC
  Filled 2012-01-29: qty 500

## 2012-01-29 MED ORDER — IBUPROFEN 800 MG PO TABS
400.0000 mg | ORAL_TABLET | ORAL | Status: DC | PRN
  Administered 2012-01-29: 400 mg via ORAL
  Filled 2012-01-29: qty 1

## 2012-01-29 MED ORDER — GUAIFENESIN ER 600 MG PO TB12
1200.0000 mg | ORAL_TABLET | Freq: Two times a day (BID) | ORAL | Status: DC
  Administered 2012-01-29 – 2012-01-31 (×4): 1200 mg via ORAL
  Filled 2012-01-29 (×8): qty 2

## 2012-01-29 MED ORDER — FLUTICASONE PROPIONATE 50 MCG/ACT NA SUSP
NASAL | Status: AC
  Filled 2012-01-29: qty 16

## 2012-01-29 NOTE — Progress Notes (Signed)
ANTIBIOTIC CONSULT NOTE - INITIAL  Pharmacy Consult for Renal adjustment antibiotics Indication: pneumonia  No Known Allergies  Patient Measurements: Height: 5\' 5"  (165.1 cm) Weight: 140 lb 1 oz (63.532 kg) IBW/kg (Calculated) : 57    Vital Signs: Temp: 97.9 F (36.6 C) (01/25 1113) Temp src: Oral (01/25 1113) BP: 160/78 mmHg (01/25 1607) Pulse Rate: 72  (01/25 1607) Intake/Output from previous day:   Intake/Output from this shift:    Labs:  Basename 01/29/12 1213  WBC 12.2*  HGB 12.7  PLT 314  LABCREA --  CREATININE 0.95   Estimated Creatinine Clearance: 52.4 ml/min (by C-G formula based on Cr of 0.95). No results found for this basename: VANCOTROUGH:2,VANCOPEAK:2,VANCORANDOM:2,GENTTROUGH:2,GENTPEAK:2,GENTRANDOM:2,TOBRATROUGH:2,TOBRAPEAK:2,TOBRARND:2,AMIKACINPEAK:2,AMIKACINTROU:2,AMIKACIN:2, in the last 72 hours   Microbiology: No results found for this or any previous visit (from the past 720 hour(s)).  Medical History: Past Medical History  Diagnosis Date  . Hypertension   . Hyperlipidemia   . ALLERGIC RHINITIS   . Bronchitis     Medications:  Scheduled:    . albuterol  5 mg Nebulization Q4H  . amLODipine  5 mg Oral Daily  . azithromycin  500 mg Intravenous Q24H  . [COMPLETED] azithromycin  500 mg Oral Once  . budesonide-formoterol  2 puff Inhalation BID  . [COMPLETED] cefTRIAXone (ROCEPHIN)  IV  1 g Intravenous Once  . cefTRIAXone (ROCEPHIN)  IV  1 g Intravenous Q24H  . doxazosin  4 mg Oral QHS  . doxycycline  100 mg Oral Q12H  . fluticasone  2 spray Each Nare Daily  . guaiFENesin  1,200 mg Oral BID  . heparin  5,000 Units Subcutaneous Q8H  . [COMPLETED] HYDROcodone-acetaminophen  1 tablet Oral Once  . lisinopril  20 mg Oral Daily  . tiotropium  18 mcg Inhalation Daily  . [DISCONTINUED] albuterol  2.5 mg Nebulization TID   Assessment: Patient on Azithromycin, Ceftriaxone, Doxycycline  Goal of Therapy:  Eradicate infection  Plan:  No renal  adjustment needed for antibiotics presently ordered Continue all doses and frequency as ordered  Raquel James, Marely Apgar Willeen Cass 01/29/2012,6:29 PM

## 2012-01-29 NOTE — H&P (Signed)
[facility] History and Physical  Jackie Walls NUU:[id number] DOB: 1945-09-05 DOA: 01/29/2012  Referring physician: Anitra Lauth, EDP PCP: Josue Hector, MD  Specialists: None  Chief Complaint: SOB and R sided rib pains  HPI: Jackie Walls is a 67 y.o. female who presented to [facility]-she states it started 2-3 days ago.  It seemed to come on by itself.  She stated she was just laying in bed at the time and "it started hurting real bad" She denies any specific SOB, no phlegm or any sick contacts.  NO flu like illnesses around her. She didn't try anything to make the pain better-She took some mucinex which seemed to help a little.   She states she does have a history of chronic bronchitis and that this may have happened maybe a month ago. Feels like she has had a low grade fever-maybe 100.6 that seems to have started   She reports coming to hosptial mainly becuas eshe was concerned about the pain in her chest and right side   Workup revealed WBC 12.2 hemoglobin 12.7 Sodium 120 potassium 3.0 BUN/creatinine 17/0.5 d-dimer 2.89 g glucose 102 2 view x-ray =patchy airspace disease with reticular nodular interstitial prominence in the right lower lobe and right upper lobe peripherally. Atypical infection or pneumonia cannot be excluded. Follow-up examination to assure resolution is recommended. Bony thorax is stable. Hyperinflation again noted  Review of Systems:  Denies burning in the urine, dark stools, tarry stool, blurred vision double vision weakness on any one side of body, not really sick contacts or anyone with other illnesses, no weakness on any one side of body no falls no chills/rigors  Past Medical History  Diagnosis Date  . Hypertension   . Hyperlipidemia   . ALLERGIC RHINITIS    Chart review  Admission 11.17.04 with RCAA stenosis-endarterectomy with dacron pathc performed  Seen by Dr. Jena Gauss of GI for some Blood in stool in a setting of PVD  Past Surgical History    Procedure Date  . Coroted artery     NECK  . Cesarean section    History   Social History Narrative   Lives with her husband at home.   No Known Allergies  Family History  Problem Relation Age of Onset  . Cancer Sister    Prior to Admission medications   Medication Sig Start Date End Date Taking? Authorizing Provider  albuterol (PROVENTIL HFA;VENTOLIN HFA) 108 (90 BASE) MCG/ACT inhaler Inhale 2 puffs into the lungs every 6 (six) hours as needed. For shortness of breath   Yes Historical Provider, MD  amLODipine (NORVASC) 5 MG tablet Take 5 mg by mouth daily.     Yes Historical Provider, MD  B Complex-C-Min-Fe-FA (HEMATINIC PLUS VIT/MINERALS PO) Take by mouth 2 (two) times daily.     Yes Historical Provider, MD  budesonide-formoterol (SYMBICORT) 160-4.5 MCG/ACT inhaler Inhale 2 puffs into the lungs 2 (two) times daily.     Yes Historical Provider, MD  cetirizine (ZYRTEC) 10 MG tablet Take 10 mg by mouth daily.     Yes Historical Provider, MD  doxazosin (CARDURA) 4 MG tablet Take 4 mg by mouth at bedtime.     Yes Historical Provider, MD  fluticasone (FLONASE) 50 MCG/ACT nasal spray Place 2 sprays into the nose daily as needed. For nasal symptoms   Yes Historical Provider, MD  hydrochlorothiazide (MICROZIDE) 12.5 MG capsule Take 12.5 mg by mouth daily.     Yes Historical Provider, MD  lisinopril (PRINIVIL,ZESTRIL) 20 MG tablet Take 20  mg by mouth daily.     Yes Historical Provider, MD  losartan (COZAAR) 50 MG tablet Take 100 mg by mouth daily.   Yes Historical Provider, MD  simvastatin (ZOCOR) 80 MG tablet Take 80 mg by mouth at bedtime.     Yes Historical Provider, MD  tiotropium (SPIRIVA) 18 MCG inhalation capsule Place 18 mcg into inhaler and inhale daily as needed.   Yes Historical Provider, MD   Physical Exam: Filed Vitals:   01/29/12 1113 01/29/12 1132 01/29/12 1401 01/29/12 1405  BP: 175/71  148/62   Pulse: 88  75   Temp: 97.9 F (36.6 C)     TempSrc: Oral     Resp: 20  18    Height: 5\' 5"  (1.651 m)     Weight: 63.532 kg (140 lb 1 oz)     SpO2: 95% 95% 87% 89%     General:  Alert pleasant oriented Caucasian female no apparent distress  Eyes: EOMI no pallor no icterus  ENT: Neck soft supple, throat edentulous poor dentition no redness to Bactrim  Neck: Supple  Cardiovascular: S1-S2 no murmur rub or gallop  Respiratory: Clinically clear no tactile vocal resonance or fremitus  Abdomen: Soft nontender nondistended  Skin: No lower extremity edema or pitting edema  Musculoskeletal: Range of motion seems intact  Psychiatric: Euthymic  Neurologic: Grossly intact motor. Sensory.  Labs on Admission:  Basic Metabolic Panel:  Lab 01/29/12 1610  NA 128*  K 3.0*  CL 92*  CO2 24  GLUCOSE 102*  BUN 17  CREATININE 0.95  CALCIUM 9.1  MG --  PHOS --   Liver Function Tests: No results found for this basename: AST:5,ALT:5,ALKPHOS:5,BILITOT:5,PROT:5,ALBUMIN:5 in the last 168 hours No results found for this basename: LIPASE:5,AMYLASE:5 in the last 168 hours No results found for this basename: AMMONIA:5 in the last 168 hours CBC:  Lab 01/29/12 1213  WBC 12.2*  NEUTROABS 10.6*  HGB 12.7  HCT 35.8*  MCV 91.6  PLT 314   Cardiac Enzymes: No results found for this basename: CKTOTAL:5,CKMB:5,CKMBINDEX:5,TROPONINI:5 in the last 168 hours  BNP (last 3 results) No results found for this basename: PROBNP:3 in the last 8760 hours CBG: No results found for this basename: GLUCAP:5 in the last 168 hours  Radiological Exams on Admission: Dg Chest 2 View  01/29/2012  *RADIOLOGY REPORT*  Clinical Data: Chest pain, shortness of breath  CHEST - 2 VIEW  Comparison: 05/24/2011  Findings: Cardiomediastinal silhouette is stable.  There is patchy airspace disease with reticular nodular interstitial prominence in the right lower lobe and right upper lobe peripherally.  Atypical infection or pneumonia cannot be excluded.  Follow-up examination to assure resolution is  recommended.  Bony thorax is stable. Hyperinflation again noted.  IMPRESSION: There is patchy airspace disease with reticular nodular interstitial prominence in the right lower lobe and right upper lobe peripherally.  Atypical infection or pneumonia cannot be excluded.  Follow-up examination to assure resolution is recommended.  Bony thorax is stable.  Hyperinflation again noted.   Original Report Authenticated By: Natasha Mead, M.D.     EKG: Independently reviewed. None performed  Assessment/Plan Principal Problem:  *CAP (community acquired pneumonia) Active Problems:  COPD (chronic obstructive pulmonary disease)  HTN (hypertension)   1. Likely community-acquired pneumonia, curb score=1, nevertheless Jackie Walls has what seems like a new oxygen requirement and I feel that inpatient management is appropriate to determine if she will have an oxygen needs. 2. Right-sided chest pain-more likely than not secondary to pleurisy. Would  not workup further even though d-dimer is elevated at this might be secondary to pneumonia 3. Likely COPD Gold stage 2-3-patient is a continuous smoke for almost 50 years-she's not willing to quit. We will get Dr. therapy involved, patient will benefit from when necessary albuterol nebs every 4 hourly, I will add a higher dose Mucinex 1200 mg twice a day-continue Symbicort 160-4.5 mcg twice a day and Spiriva 18 mcg daily 4. Hyponatremia potentially secondary to thiazide. As will be held, we will recheck basic metabolic panel in the morning 5. Hypertension patient has therapeutic duplication with losartan and lisinopril-I will hold her losartan and continue lisinopril 20. Continue doxazosin 4 mg each bedtime and amlodipine 5 mg.  none if consultant consulted, please document name and whether formally or informally consulted  Code Status: Full o) Family Communication: None at bedside  Disposition Plan:  Inpatient, Med-surg, 2-3 d  Time spent: 74  Rhetta Mura [facility] Pager [phone]  If 7PM-7AM, please contact night-coverage [email or web address] Password [facility] 01/29/2012, 3:02 PM

## 2012-01-29 NOTE — ED Notes (Addendum)
Pt co pain to right rib and thoracic back area, pain is worse with sitting and is worse with sleeping, denies any injury, denies any injury, admits to cough "at times" for a month, cough is productive with white phlegm production, pt does admit to becoming sob with "doing things" requiring her to use her inhaler more.

## 2012-01-29 NOTE — ED Notes (Signed)
Patient's O2 sat 87% on room air. Patient nonsymptomatic, per son patient has COPD. 2 liters of oxygen applied via [place]. O2 sat 94%. EDP made aware.

## 2012-01-29 NOTE — ED Provider Notes (Addendum)
History   This chart was scribed for Jackie Sprout, MD by Jackie Walls, ED Scribe. The patient was seen in room APA07/[facility]07. Patient's care was started at 1059.  CSN: [id number]  Arrival date & time 01/29/12  1059   First MD Initiated Contact with Patient 01/29/12 1122      Chief Complaint  Patient presents with  . Pain  . Shortness of Breath    Patient is a 67 y.o. female presenting with chest pain. The history is provided by the patient. No language interpreter was used.  Chest Pain The chest pain began 2 days ago. Chest pain occurs constantly. The chest pain is unchanged. The severity of the pain is moderate. The quality of the pain is described as aching. Primary symptoms include shortness of breath.     Jackie Walls is a 67 y.o. female with a h/o HTN and hyperlipidemia, who presents to the Emergency Department complaining of 2 days of recurrent, sudden onset, progressive, moderate right side chest pain and SOB. Pain is sharp and worse at night. Despite taking ASA, there has been no improvement. Pt currently uses Symbicort ihaler twice daily. No difficulty urinating, dysuria, back pain, fever, chills, or n/v/d. Pt is a current everyday smoker, denying alcohol and illicit drugs.Surgical Hx includes carotid artery repair.   Past Medical History  Diagnosis Date  . Hypertension   . Hyperlipidemia   . ALLERGIC RHINITIS     Past Surgical History  Procedure Date  . Coroted artery     NECK  . Cesarean section     Family History  Problem Relation Age of Onset  . Cancer Sister     History  Substance Use Topics  . Smoking status: Current Every Day Smoker  . Smokeless tobacco: Not on file  . Alcohol Use: No    Review of Systems  Respiratory: Positive for shortness of breath. Negative for chest tightness.   Cardiovascular: Positive for chest pain.  Genitourinary: Negative for dysuria.  All other systems reviewed and are negative.    Allergies  Review of patient's  allergies indicates no known allergies.  Home Medications   Current Outpatient Rx  Name  Route  Sig  Dispense  Refill  . AMLODIPINE BESYLATE 5 MG PO TABS   Oral   Take 5 mg by mouth daily.           Marland Kitchen HEMATINIC PLUS VIT/MINERALS PO   Oral   Take by mouth 2 (two) times daily.           . BUDESONIDE-FORMOTEROL FUMARATE 160-4.5 MCG/ACT IN AERO   Inhalation   Inhale 2 puffs into the lungs 2 (two) times daily.           Marland Kitchen CETIRIZINE HCL 10 MG PO TABS   Oral   Take 10 mg by mouth daily.           Marland Kitchen DOXAZOSIN MESYLATE 4 MG PO TABS   Oral   Take 4 mg by mouth at bedtime.           Marland Kitchen FLUTICASONE PROPIONATE 50 MCG/ACT NA SUSP   Nasal   Place 2 sprays into the nose daily.           Marland Kitchen HYDROCHLOROTHIAZIDE 12.5 MG PO CAPS   Oral   Take 12.5 mg by mouth daily.           Marland Kitchen LISINOPRIL 20 MG PO TABS   Oral   Take 20 mg by mouth daily.           Marland Kitchen  LOSARTAN POTASSIUM PO   Oral   Take 50 mg by mouth 2 (two) times daily.           Marland Kitchen SIMVASTATIN 80 MG PO TABS   Oral   Take 80 mg by mouth at bedtime.           . TRAMADOL HCL 50 MG PO TABS   Oral   Take 50 mg by mouth daily. Prn pain            BP 175/71  Pulse 88  Temp 97.9 F (36.6 C) (Oral)  Resp 20  Ht 5\' 5"  (1.651 m)  Wt 140 lb 1 oz (63.532 kg)  BMI 23.31 kg/m2  SpO2 95%  Physical Exam  Nursing note and vitals reviewed. Constitutional: She is oriented to person, place, and time. She appears well-developed and well-nourished. No distress.  HENT:  Head: Normocephalic and atraumatic.  Eyes: Conjunctivae normal and EOM are normal.  Neck: Neck supple. No tracheal deviation present.  Cardiovascular: Normal rate.   Murmur heard.  Systolic murmur is present with a grade of 3/6  Pulmonary/Chest: Effort normal. No respiratory distress.       Rales to right and upper lobes. Globally decreased breath sounds. Chest wall tenderness on the right.  Abdominal: She exhibits no distension.  Musculoskeletal:  Normal range of motion.  Neurological: She is alert and oriented to person, place, and time. No sensory deficit.  Skin: Skin is dry.  Psychiatric: She has a normal mood and affect. Her behavior is normal.    ED Course  Procedures DIAGNOSTIC STUDIES: Oxygen Saturation is 95% on room air, normal by my interpretation.    COORDINATION OF CARE: 11:22- Ordered DG Chest 2 View 1 time imaging. 11:24- Evaluated Pt. Pt is awake, alert, and without distress. 11:31- Ordered Basic metabolic panel, CBC with Differential, and D-dimer, quantitative Once. 11:33- Patient understand and agree with initial ED impression and plan with expectations set for ED visit.     Labs Reviewed  CBC WITH DIFFERENTIAL - Abnormal; Notable for the following:    WBC 12.2 (*)     HCT 35.8 (*)     Neutrophils Relative 87 (*)     Neutro Abs 10.6 (*)     Lymphocytes Relative 7 (*)     All other components within normal limits  BASIC METABOLIC PANEL - Abnormal; Notable for the following:    Sodium 128 (*)     Potassium 3.0 (*)     Chloride 92 (*)     Glucose, Bld 102 (*)     GFR calc non Af Amer 61 (*)     GFR calc Af Amer 71 (*)     All other components within normal limits  D-DIMER, QUANTITATIVE - Abnormal; Notable for the following:    D-Dimer, Quant 2.89 (*)     All other components within normal limits   Dg Chest 2 View  01/29/2012  *RADIOLOGY REPORT*  Clinical Data: Chest pain, shortness of breath  CHEST - 2 VIEW  Comparison: 05/24/2011  Findings: Cardiomediastinal silhouette is stable.  There is patchy airspace disease with reticular nodular interstitial prominence in the right lower lobe and right upper lobe peripherally.  Atypical infection or pneumonia cannot be excluded.  Follow-up examination to assure resolution is recommended.  Bony thorax is stable. Hyperinflation again noted.  IMPRESSION: There is patchy airspace disease with reticular nodular interstitial prominence in the right lower lobe and right  upper lobe peripherally.  Atypical infection or  pneumonia cannot be excluded.  Follow-up examination to assure resolution is recommended.  Bony thorax is stable.  Hyperinflation again noted.   Original Report Authenticated By: Natasha Mead, M.D.      No diagnosis found.    MDM   Patient presenting today with cough, shortness of breath and right-sided rib pain. Patient is a current smoker and uses inhalers regularly. She has no abdominal tenderness or urinary symptoms. She denies any infectious symptoms. Low suspicion for cardiac disease at this time.  PE versus COPD exacerbation versus infectious etiology versus spontaneous pneumothorax.  CBC, BMP, d-dimer, chest x-ray pending. Currently patient is not wheezing but does have globally decreased breath sounds but states that she feels at her baseline and does not need a breathing treatment  1:58 PM CBC with leukocytosis of 12,000, BMP is normal and elevated d-dimer. However chest x-ray shows multilobar pneumonia which would explain the elevated d-dimer and do not feel that patient needs to go on for CTA at this time. Patient treated with Rocephin and azithromycin. When discussing admission with the patient she would rather go home.  However here patient became hypoxic with O2 sats in the between 87 and 88. Discussed with her and will admit for further evaluation  I personally performed the services described in this documentation, which was scribed in my presence.  The recorded information has been reviewed and considered.    Jackie Sprout, MD 01/29/12 1235  Jackie Sprout, MD 01/29/12 1400  Jackie Sprout, MD 01/29/12 1452

## 2012-01-30 ENCOUNTER — Inpatient Hospital Stay (HOSPITAL_COMMUNITY): Payer: Medicare Other

## 2012-01-30 DIAGNOSIS — J449 Chronic obstructive pulmonary disease, unspecified: Secondary | ICD-10-CM

## 2012-01-30 DIAGNOSIS — I10 Essential (primary) hypertension: Secondary | ICD-10-CM

## 2012-01-30 LAB — COMPREHENSIVE METABOLIC PANEL
BUN: 16 mg/dL (ref 6–23)
CO2: 24 mEq/L (ref 19–32)
Chloride: 96 mEq/L (ref 96–112)
Creatinine, Ser: 0.78 mg/dL (ref 0.50–1.10)
GFR calc non Af Amer: 85 mL/min — ABNORMAL LOW (ref >90)
Glucose, Bld: 99 mg/dL (ref 70–99)
Total Bilirubin: 0.2 mg/dL — ABNORMAL LOW (ref 0.3–1.2)

## 2012-01-30 LAB — CBC WITH DIFFERENTIAL/PLATELET
Eosinophils Relative: 1 % (ref 0–5)
HCT: 32.5 % — ABNORMAL LOW (ref 36.0–46.0)
Hemoglobin: 11.3 g/dL — ABNORMAL LOW (ref 12.0–15.0)
Lymphocytes Relative: 9 % — ABNORMAL LOW (ref 12–46)
MCV: 92.6 fL (ref 78.0–100.0)
Monocytes Absolute: 0.6 10*3/uL (ref 0.1–1.0)
Monocytes Relative: 5 % (ref 3–12)
Neutro Abs: 9.7 10*3/uL — ABNORMAL HIGH (ref 1.7–7.7)
WBC: 11.3 10*3/uL — ABNORMAL HIGH (ref 4.0–10.5)

## 2012-01-30 LAB — STREP PNEUMONIAE URINARY ANTIGEN: Strep Pneumo Urinary Antigen: NEGATIVE

## 2012-01-30 MED ORDER — IOHEXOL 350 MG/ML SOLN
100.0000 mL | Freq: Once | INTRAVENOUS | Status: AC | PRN
  Administered 2012-01-30: 100 mL via INTRAVENOUS

## 2012-01-30 MED ORDER — BIOTENE DRY MOUTH MT LIQD
15.0000 mL | Freq: Two times a day (BID) | OROMUCOSAL | Status: DC
  Administered 2012-01-30 – 2012-01-31 (×2): 15 mL via OROMUCOSAL

## 2012-01-30 NOTE — Progress Notes (Signed)
     Subjective: This lady feels much improved. The history she gives me is of a sudden onset of right-sided pleuritic chest pain. She did not have much of a cough nor fever. She did feel dyspneic with the pleuritic chest pain. Chest x-ray was suggestive of possible right upper and lower lobe pneumonia.           Physical Exam: Blood pressure 121/42, pulse 78, temperature 98.1 F (36.7 C), temperature source Oral, resp. rate 20, height 5\' 5"  (1.651 m), weight 63.532 kg (140 lb 1 oz), SpO2 95.00%. She looks systemically well. She is not toxic or septic. Lung fields are entirely clear with no evidence of crackles, wheezes or bronchial breathing. There is no pleural rub. Heart sounds are present and the are not tachycardic at rest. She is in sinus rhythm. She is alert and orientated.   Investigations:  No results found for this or any previous visit (from the past 240 hour(s)).   Basic Metabolic Panel:  Basename 01/30/12 0620 01/29/12 1709 01/29/12 1213  NA 129* -- 128*  K 3.7 -- 3.0*  CL 96 -- 92*  CO2 24 -- 24  GLUCOSE 99 -- 102*  BUN 16 -- 17  CREATININE 0.78 0.82 --  CALCIUM 8.7 -- 9.1  MG -- -- --  PHOS -- -- --   Liver Function Tests:  [facility] 01/30/12 0620  AST 14  ALT 14  ALKPHOS 151*  BILITOT 0.2*  PROT 5.7*  ALBUMIN 2.3*     CBC:  Basename 01/30/12 0620 01/29/12 1709 01/29/12 1213  WBC 11.3* 14.4* --  NEUTROABS 9.7* -- 10.6*  HGB 11.3* 12.6 --  HCT 32.5* 35.6* --  MCV 92.6 91.8 --  PLT 323 314 --    Dg Chest 2 View  01/29/2012  *RADIOLOGY REPORT*  Clinical Data: Chest pain, shortness of breath  CHEST - 2 VIEW  Comparison: 05/24/2011  Findings: Cardiomediastinal silhouette is stable.  There is patchy airspace disease with reticular nodular interstitial prominence in the right lower lobe and right upper lobe peripherally.  Atypical infection or pneumonia cannot be excluded.  Follow-up examination to assure resolution is recommended.  Bony thorax is  stable. Hyperinflation again noted.  IMPRESSION: There is patchy airspace disease with reticular nodular interstitial prominence in the right lower lobe and right upper lobe peripherally.  Atypical infection or pneumonia cannot be excluded.  Follow-up examination to assure resolution is recommended.  Bony thorax is stable.  Hyperinflation again noted.   Original Report Authenticated By: Natasha Mead, M.D.       Medications: I have reviewed the patient's current medications.  Impression: 1. Right-sided pleuritic chest pain. Probable community-acquired pneumonia. I am also somewhat suspicious of a pulmonary embolism. 2. COPD, stable. 3. Hypertension, stable. 4.Hyponatremia secondary to medications, stable.     Plan: 1. CT angiogram of the chest stat to make sure this patient does not have a pulmonary embolism. Otherwise continue current therapy.     LOS: 1 day   Wilson Singer Pager [phone]  01/30/2012, 11:03 AM

## 2012-01-31 LAB — LEGIONELLA ANTIGEN, URINE

## 2012-01-31 MED ORDER — BENZONATATE 100 MG PO CAPS
200.0000 mg | ORAL_CAPSULE | Freq: Three times a day (TID) | ORAL | Status: DC | PRN
  Administered 2012-01-31: 200 mg via ORAL
  Filled 2012-01-31: qty 2

## 2012-01-31 MED ORDER — LEVOFLOXACIN 500 MG PO TABS: 500.0000 mg | ORAL_TABLET | Freq: Every day | ORAL | Status: AC

## 2012-01-31 MED ORDER — BENZONATATE 200 MG PO CAPS: 200.0000 mg | ORAL_CAPSULE | Freq: Three times a day (TID) | ORAL | Status: DC | PRN

## 2012-01-31 NOTE — Progress Notes (Signed)
Discharge instructions given to pt with teach back given to RN. Pt. Taken to car via W/C. 

## 2012-01-31 NOTE — Discharge Summary (Signed)
Physician Discharge Summary  IRA BUSBIN ZHY:[id number] DOB: [birth year] DOA: 01/29/2012  PCP: Josue Hector, MD  Admit date: 01/29/2012 Discharge date: 01/31/2012  Time spent: Greater than 30 minutes  Recommendations for Outpatient Follow-up:  1. Follow with primary care physician in the next couple of weeks.   Discharge Diagnoses:  1. Right upper and right lower lobe community-acquired pneumonia. Improving clinically. 2. COPD, stable. 3. Hypertension.   Discharge Condition: Stable and improved.  Diet recommendation: Regular.  Filed Weights   01/29/12 1113  Weight: 63.532 kg (140 lb 1 oz)    History of present illness:  This very pleasant 67 year old lady presents to the hospital with symptoms right-sided chest pain and dyspnea. Please see initial history as outlined below: HPI: Jackie Walls is a 67 y.o. female who presented to [facility]-she states it started 2-3 days ago. It seemed to come on by itself. She stated she was just laying in bed at the time and "it started hurting real bad" She denies any specific SOB, no phlegm or any sick contacts. NO flu like illnesses around her.  She didn't try anything to make the pain better-She took some mucinex which seemed to help a little.  She states she does have a history of chronic bronchitis and that this may have happened maybe a month ago.  Feels like she has had a low grade fever-maybe 100.6 that seems to have started  She reports coming to hosptial mainly becuas eshe was concerned about the pain in her chest and right side  Workup revealed WBC 12.2 hemoglobin 12.7  Sodium 120 potassium 3.0 BUN/creatinine 17/0.5 d-dimer 2.89 g glucose 102  2 view x-ray =patchy airspace disease with reticular nodular interstitial prominence in the right lower lobe and right upper lobe peripherally. Atypical infection or pneumonia cannot be excluded. Follow-up examination to assure resolution is recommended. Bony thorax is stable. Hyperinflation  again noted  Hospital Course:  The patient was admitted and started on intravenous antibiotics and oxygen as required. She is doing extremely well and feels much improved in the last 24 hours. Because of her history of sudden onset of right-sided pleuritic chest pain, and elevated d-dimer, a CT of the chest was done yesterday. This was negative for pulmonary embolism. It did, however, show the right upper and lower lobe pneumonia. Patient is keen to go home today. She has been on 2 L of oxygen by nasal cannula. We will see if she qualifies for home oxygen. She will need followup appointment with her primary care physician in the next couple weeks and repeat chest x-ray in 4 weeks to make sure there is clearing of her pneumonia.  Procedures:  None.   Consultations:  None.  Discharge Exam: Filed Vitals:   01/31/12 0320 01/31/12 0500 01/31/12 0806 01/31/12 0943  BP:  149/50  158/74  Pulse:  79    Temp:  98.2 F (36.8 C)    TempSrc:  Oral    Resp:  20    Height:      Weight:      SpO2: 94% 97% 97%     General: She looks systemically well. She is not toxic or septic. Heart sounds are present without gallop rhythm.  Lung fields are clear with just a few crackles in the right mid and lower zones. There is no bronchial breathing. There is no wheezing. She is alert and orientated.  Discharge Instructions  Discharge Orders    Future Orders Please Complete By Expires   Diet -  low sodium heart healthy      Increase activity slowly          Medication List     As of 01/31/2012 11:04 AM    TAKE these medications         albuterol 108 (90 BASE) MCG/ACT inhaler   Commonly known as: PROVENTIL HFA;VENTOLIN HFA   Inhale 2 puffs into the lungs every 6 (six) hours as needed. For shortness of breath      amLODipine 5 MG tablet   Commonly known as: NORVASC   Take 5 mg by mouth daily.      benzonatate 200 MG capsule   Commonly known as: TESSALON   Take 1 capsule (200 mg total) by mouth  3 (three) times daily as needed for cough.      budesonide-formoterol 160-4.5 MCG/ACT inhaler   Commonly known as: SYMBICORT   Inhale 2 puffs into the lungs 2 (two) times daily.      cetirizine 10 MG tablet   Commonly known as: ZYRTEC   Take 10 mg by mouth daily.      doxazosin 4 MG tablet   Commonly known as: CARDURA   Take 4 mg by mouth at bedtime.      fluticasone 50 MCG/ACT nasal spray   Commonly known as: FLONASE   Place 2 sprays into the nose daily as needed. For nasal symptoms      HEMATINIC PLUS VIT/MINERALS PO   Take by mouth 2 (two) times daily.      hydrochlorothiazide 12.5 MG capsule   Commonly known as: MICROZIDE   Take 12.5 mg by mouth daily.      levofloxacin 500 MG tablet   Commonly known as: LEVAQUIN   Take 1 tablet (500 mg total) by mouth daily.      lisinopril 20 MG tablet   Commonly known as: PRINIVIL,ZESTRIL   Take 20 mg by mouth daily.      losartan 50 MG tablet   Commonly known as: COZAAR   Take 100 mg by mouth daily.      simvastatin 80 MG tablet   Commonly known as: ZOCOR   Take 80 mg by mouth at bedtime.      tiotropium 18 MCG inhalation capsule   Commonly known as: SPIRIVA   Place 18 mcg into inhaler and inhale daily as needed.          The results of significant diagnostics from this hospitalization (including imaging, microbiology, ancillary and laboratory) are listed below for reference.    Significant Diagnostic Studies: Dg Chest 2 View  01/29/2012  *RADIOLOGY REPORT*  Clinical Data: Chest pain, shortness of breath  CHEST - 2 VIEW  Comparison: 05/24/2011  Findings: Cardiomediastinal silhouette is stable.  There is patchy airspace disease with reticular nodular interstitial prominence in the right lower lobe and right upper lobe peripherally.  Atypical infection or pneumonia cannot be excluded.  Follow-up examination to assure resolution is recommended.  Bony thorax is stable. Hyperinflation again noted.  IMPRESSION: There is patchy  airspace disease with reticular nodular interstitial prominence in the right lower lobe and right upper lobe peripherally.  Atypical infection or pneumonia cannot be excluded.  Follow-up examination to assure resolution is recommended.  Bony thorax is stable.  Hyperinflation again noted.   Original Report Authenticated By: Natasha Mead, M.D.    Ct Angio Chest Pe W/cm &/or Wo Cm  01/30/2012  *RADIOLOGY REPORT*  Clinical Data: Shortness of breath.  Elevated D-dimer.  CT ANGIOGRAPHY CHEST  Technique:  Multidetector CT imaging of the chest using the standard protocol during bolus administration of intravenous contrast. Multiplanar reconstructed images including MIPs were obtained and reviewed to evaluate the vascular anatomy.  Contrast: OMNIPAQUE IOHEXOL 350 MG/ML SOLN  Comparison: None.  Findings: Contrast opacification of the pulmonary arteries is good respiratory motion blurred many of the images.  Overall, the study is of moderate to good diagnostic quality.  No filling defects within either main pulmonary artery or their branches in either lung to suggest pulmonary embolism.  Heart size upper normal.  Extensive three-vessel coronary atherosclerosis.  No pericardial effusion.  Severe atherosclerosis involving the thoracic and upper abdominal aorta.  Ectatic ascending thoracic aorta measuring approximately 4.4 cm diameter.  Calcified plaque at the origin of the left subclavian artery which likely causes significant stenosis.  Emphysematous changes throughout both lungs with apical blebs on the left.  Biapical pleuroparenchymal scarring, right greater than left.  Airspace consolidation in the right upper and lower lobes with associated right pleural effusion.  Scarring in the lingula. No left pleural effusion.  Borderline right hilar nodes measuring approximate 2.1 x 1.3 cm (series 4, image 39).  Borderline precarinal mediastinal lymph node measuring approximate 1.5 x 2.1 cm (image 32).  No significant  lymphadenopathy.  Visualized thyroid gland unremarkable.  Visualized extreme upper abdomen unremarkable.  Bone window images demonstrate generalized osseous demineralization, exaggeration of the usual thoracic kyphosis, mild anterior wedge compression deformities of T7, T8, T9, and T10, and extensive calcification in the T11-12 disc.  IMPRESSION:  1.  No evidence pulmonary embolism. 2.  Right upper lobe and right lower lobe pneumonia with an associated right pleural effusion.  This is superimposed upon COPD/emphysema. 3.  Biapical pleuroparenchymal scarring, right greater than left. 4.  Mild cardiomegaly.  Extensive three-vessel coronary atherosclerosis. 5.  Ectatic ascending thoracic aorta measuring up to 4.4 cm diameter. 6.  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   Original Report Authenticated By: Hulan Saas, M.D.        Labs: Basic Metabolic Panel:  Lab 01/30/12 1610 01/29/12 1709 01/29/12 1213  NA 129* -- 128*  K 3.7 -- 3.0*  CL 96 -- 92*  CO2 24 -- 24  GLUCOSE 99 -- 102*  BUN 16 -- 17  CREATININE 0.78 0.82 0.95  CALCIUM 8.7 -- 9.1  MG -- -- --  PHOS -- -- --   Liver Function Tests:  Lab 01/30/12 0620  AST 14  ALT 14  ALKPHOS 151*  BILITOT 0.2*  PROT 5.7*  ALBUMIN 2.3*     CBC:  Lab 01/30/12 0620 01/29/12 1709 01/29/12 1213  WBC 11.3* 14.4* 12.2*  NEUTROABS 9.7* -- 10.6*  HGB 11.3* 12.6 12.7  HCT 32.5* 35.6* 35.8*  MCV 92.6 91.8 91.6  PLT 323 314 314         Signed:  Zachary Lovins C  [facility] 01/31/2012, 11:04 AM

## 2012-01-31 NOTE — Progress Notes (Signed)
Pt.'s oxygen level at rest 95-96% without oxygen, pt. Up to walk in halls with RN without oxygen, oxygen level reading 92-93% while ambulating in halls,  Pt. Back in bed, oxygen level increasing to 93-94% without oxygen.

## 2012-02-01 NOTE — Progress Notes (Signed)
UR Chart Review Completed  

## 2012-02-02 LAB — CULTURE, RESPIRATORY W GRAM STAIN: Culture: NORMAL

## 2012-02-15 ENCOUNTER — Encounter (HOSPITAL_COMMUNITY): Payer: Self-pay | Admitting: *Deleted

## 2012-02-15 ENCOUNTER — Emergency Department (HOSPITAL_COMMUNITY): Payer: Medicare Other

## 2012-02-15 ENCOUNTER — Emergency Department (HOSPITAL_COMMUNITY)
Admission: EM | Admit: 2012-02-15 | Discharge: 2012-02-15 | Disposition: A | Discharge status: Home / Self Care | Payer: Medicare Other | Attending: Emergency Medicine | Admitting: Emergency Medicine

## 2012-02-15 DIAGNOSIS — R0600 Dyspnea, unspecified: Secondary | ICD-10-CM

## 2012-02-15 DIAGNOSIS — R5381 Other malaise: Secondary | ICD-10-CM | POA: Insufficient documentation

## 2012-02-15 DIAGNOSIS — Z8639 Personal history of other endocrine, nutritional and metabolic disease: Secondary | ICD-10-CM | POA: Insufficient documentation

## 2012-02-15 DIAGNOSIS — Z79899 Other long term (current) drug therapy: Secondary | ICD-10-CM | POA: Insufficient documentation

## 2012-02-15 DIAGNOSIS — R5383 Other fatigue: Secondary | ICD-10-CM | POA: Insufficient documentation

## 2012-02-15 DIAGNOSIS — Z862 Personal history of diseases of the blood and blood-forming organs and certain disorders involving the immune mechanism: Secondary | ICD-10-CM | POA: Insufficient documentation

## 2012-02-15 DIAGNOSIS — R0989 Other specified symptoms and signs involving the circulatory and respiratory systems: Secondary | ICD-10-CM | POA: Insufficient documentation

## 2012-02-15 DIAGNOSIS — F17200 Nicotine dependence, unspecified, uncomplicated: Secondary | ICD-10-CM | POA: Insufficient documentation

## 2012-02-15 DIAGNOSIS — R0609 Other forms of dyspnea: Secondary | ICD-10-CM | POA: Insufficient documentation

## 2012-02-15 DIAGNOSIS — Z8709 Personal history of other diseases of the respiratory system: Secondary | ICD-10-CM | POA: Insufficient documentation

## 2012-02-15 DIAGNOSIS — I10 Essential (primary) hypertension: Secondary | ICD-10-CM | POA: Insufficient documentation

## 2012-02-15 DIAGNOSIS — E785 Hyperlipidemia, unspecified: Secondary | ICD-10-CM | POA: Insufficient documentation

## 2012-02-15 DIAGNOSIS — R059 Cough, unspecified: Secondary | ICD-10-CM | POA: Insufficient documentation

## 2012-02-15 DIAGNOSIS — Z8701 Personal history of pneumonia (recurrent): Secondary | ICD-10-CM | POA: Insufficient documentation

## 2012-02-15 DIAGNOSIS — R05 Cough: Secondary | ICD-10-CM | POA: Insufficient documentation

## 2012-02-15 LAB — CBC WITH DIFFERENTIAL/PLATELET
Eosinophils Absolute: 0.1 10*3/uL (ref 0.0–0.7)
Lymphocytes Relative: 18 % (ref 12–46)
Lymphs Abs: 1.2 10*3/uL (ref 0.7–4.0)
Neutro Abs: 5.3 10*3/uL (ref 1.7–7.7)
Neutrophils Relative %: 77 % (ref 43–77)
Platelets: 431 10*3/uL — ABNORMAL HIGH (ref 150–400)
RBC: 4.32 MIL/uL (ref 3.87–5.11)
WBC: 6.9 10*3/uL (ref 4.0–10.5)

## 2012-02-15 LAB — URINALYSIS, ROUTINE W REFLEX MICROSCOPIC
Bilirubin Urine: NEGATIVE
Glucose, UA: NEGATIVE mg/dL
Ketones, ur: NEGATIVE mg/dL
Nitrite: NEGATIVE
pH: 6 (ref 5.0–8.0)

## 2012-02-15 LAB — COMPREHENSIVE METABOLIC PANEL
ALT: 16 U/L (ref 0–35)
Alkaline Phosphatase: 97 U/L (ref 39–117)
Chloride: 91 mEq/L — ABNORMAL LOW (ref 96–112)
GFR calc Af Amer: >90 mL/min (ref >90)
Glucose, Bld: 91 mg/dL (ref 70–99)
Potassium: 4 mEq/L (ref 3.5–5.1)
Sodium: 129 mEq/L — ABNORMAL LOW (ref 135–145)
Total Protein: 7.3 g/dL (ref 6.0–8.3)

## 2012-02-15 NOTE — ED Notes (Signed)
Admitted x 2 wks ago for PNA.  States left too early and has not felt better since.  C/o increased sob with cough.  Denies fever.

## 2012-02-15 NOTE — ED Provider Notes (Signed)
History     CSN: [id number]  Arrival date & time 02/15/12  1627   First MD Initiated Contact with Patient 02/15/12 1640      Chief Complaint  Patient presents with  . Cough  . Shortness of Breath    (Consider location/radiation/quality/duration/timing/severity/associated sxs/prior treatment) Patient is a 67 y.o. female presenting with cough and shortness of breath.  Cough Associated symptoms: shortness of breath   Associated symptoms: no chest pain, no headaches and no rash   Shortness of Breath Associated symptoms: cough   Associated symptoms: no abdominal pain, no chest pain, no headaches, no rash and no vomiting    patient was sent in by her primary care Dr. 2 weeks ago she was admitted hospital for pneumonia. She states she's not had much better. She states she's still having trouble breathing, however the pain is improved. She's had some fatigue. No dysuria. Mild cough. She was sent in for an x-ray. During the admission she was also hyponatremic.  Past Medical History  Diagnosis Date  . Hypertension   . Hyperlipidemia   . ALLERGIC RHINITIS   . Bronchitis     Past Surgical History  Procedure Laterality Date  . Coroted artery      NECK  . Cesarean section      Family History  Problem Relation Age of Onset  . Cancer Sister     History  Substance Use Topics  . Smoking status: Current Every Day Smoker -- 0.20 packs/day    Start date: 01/05/1948  . Smokeless tobacco: Not on file  . Alcohol Use: No    OB History   Grav Para Term Preterm Abortions TAB SAB Ect Mult Living                  Review of Systems  Constitutional: Positive for fatigue. Negative for activity change and appetite change.  HENT: Negative for neck stiffness.   Eyes: Negative for pain.  Respiratory: Positive for cough and shortness of breath. Negative for chest tightness.   Cardiovascular: Negative for chest pain and leg swelling.  Gastrointestinal: Negative for nausea, vomiting,  abdominal pain and diarrhea.  Genitourinary: Negative for flank pain.  Musculoskeletal: Negative for back pain.  Skin: Negative for rash.  Neurological: Negative for weakness, numbness and headaches.  Psychiatric/Behavioral: Negative for behavioral problems.    Allergies  Review of patient's allergies indicates no known allergies.  Home Medications   Current Outpatient Rx  Name  Route  Sig  Dispense  Refill  . albuterol (PROAIR HFA) 108 (90 BASE) MCG/ACT inhaler   Inhalation   Inhale 2 puffs into the lungs every 6 (six) hours as needed for wheezing.         Marland Kitchen amLODipine (NORVASC) 5 MG tablet   Oral   Take 5 mg by mouth every morning.          . B Complex-C-Min-Fe-FA (HEMATINIC PLUS VIT/MINERALS PO)   Oral   Take 2 tablets by mouth every morning.          . budesonide-formoterol (SYMBICORT) 160-4.5 MCG/ACT inhaler   Inhalation   Inhale 2 puffs into the lungs 2 (two) times daily.           . cetirizine (ZYRTEC) 10 MG tablet   Oral   Take 10 mg by mouth every morning.          Marland Kitchen doxazosin (CARDURA) 4 MG tablet   Oral   Take 4 mg by mouth every morning.          Marland Kitchen  fluticasone (FLONASE) 50 MCG/ACT nasal spray   Nasal   Place 2 sprays into the nose daily as needed. For nasal symptoms         . hydrochlorothiazide (HYDRODIURIL) 25 MG tablet   Oral   Take 25 mg by mouth every morning.          Marland Kitchen lisinopril (PRINIVIL,ZESTRIL) 20 MG tablet   Oral   Take 20 mg by mouth every morning.          Marland Kitchen losartan (COZAAR) 50 MG tablet   Oral   Take 100 mg by mouth every morning.          . simvastatin (ZOCOR) 80 MG tablet   Oral   Take 80 mg by mouth at bedtime.           Marland Kitchen tiotropium (SPIRIVA) 18 MCG inhalation capsule   Inhalation   Place 18 mcg into inhaler and inhale daily.            BP 193/80  Pulse 71  Temp(Src) 97.7 F (36.5 C) (Oral)  Resp 20  Ht 5\' 5"  (1.651 m)  Wt 140 lb (63.504 kg)  BMI 23.3 kg/m2  SpO2 92%  Physical Exam   Nursing note and vitals reviewed. Constitutional: She is oriented to person, place, and time. She appears well-developed and well-nourished.  HENT:  Head: Normocephalic and atraumatic.  Eyes: EOM are normal. Pupils are equal, round, and reactive to light.  Neck: Normal range of motion. Neck supple.  Cardiovascular: Normal rate, regular rhythm and normal heart sounds.   No murmur heard. Pulmonary/Chest: Effort normal and breath sounds normal. No respiratory distress. She has no wheezes. She has no rales.  Abdominal: Soft. Bowel sounds are normal. She exhibits no distension. There is no tenderness. There is no rebound and no guarding.  Musculoskeletal: Normal range of motion.  Neurological: She is alert and oriented to person, place, and time. No cranial nerve deficit.  Skin: Skin is warm and dry.  Psychiatric: She has a normal mood and affect. Her speech is normal.    ED Course  Procedures (including critical care time)  Labs Reviewed  CBC WITH DIFFERENTIAL - Abnormal; Notable for the following:    Platelets 431 (*)    All other components within normal limits  COMPREHENSIVE METABOLIC PANEL - Abnormal; Notable for the following:    Sodium 129 (*)    Chloride 91 (*)    Total Bilirubin 0.2 (*)    GFR calc non Af Amer 88 (*)    All other components within normal limits  URINALYSIS, ROUTINE W REFLEX MICROSCOPIC - Abnormal; Notable for the following:    Specific Gravity, Urine <1.005 (*)    All other components within normal limits   Dg Chest 2 View  02/15/2012  *RADIOLOGY REPORT*  Clinical Data: Dyspnea.  Pneumonia.  CHEST - 2 VIEW  Comparison: 01/30/2012  Findings: Right sided airspace opacities and mostly resolved although there is some residual nodularity peripherally at the right lung base, of uncertain significance.  Emphysema noted.  Mild cardiomegaly.  Right greater than left apical pleuroparenchymal scarring.   Mild thoracic kyphosis with spondylosis.  IMPRESSION:  1.  Mostly  resolved pneumonia in the right lung, although there is some faint residual peripheral opacity at the right lung base. 2.  Cardiomegaly. 3.  Emphysema.   Original Report Authenticated By: Gaylyn Rong, M.D.      1. Dyspnea   2. Hypertension       MDM  Patient was sent in for x-ray for following up for previous pneumonia. X-ray shows mostly resolved pneumonia. Her symptoms have improved in that the pain is no longer there. Her white count is reassuring. Her hyponatremia is improved from before and is near her discharge numbers. She is not hypoxic. She does have some hypertension only to get followed. She'll be discharged home to follow with Dr. Veda Canning R. Rubin Payor, MD 02/15/12 2141

## 2012-03-12 ENCOUNTER — Emergency Department (HOSPITAL_COMMUNITY)
Admission: EM | Admit: 2012-03-12 | Discharge: 2012-03-12 | Disposition: A | Discharge status: Home / Self Care | Payer: Medicare Other | Attending: Emergency Medicine | Admitting: Emergency Medicine

## 2012-03-12 ENCOUNTER — Encounter (HOSPITAL_COMMUNITY): Payer: Self-pay | Admitting: Emergency Medicine

## 2012-03-12 ENCOUNTER — Emergency Department (HOSPITAL_COMMUNITY): Payer: Medicare Other

## 2012-03-12 ENCOUNTER — Other Ambulatory Visit: Payer: Self-pay

## 2012-03-12 DIAGNOSIS — R059 Cough, unspecified: Secondary | ICD-10-CM | POA: Insufficient documentation

## 2012-03-12 DIAGNOSIS — Z8709 Personal history of other diseases of the respiratory system: Secondary | ICD-10-CM | POA: Insufficient documentation

## 2012-03-12 DIAGNOSIS — R05 Cough: Secondary | ICD-10-CM | POA: Insufficient documentation

## 2012-03-12 DIAGNOSIS — E785 Hyperlipidemia, unspecified: Secondary | ICD-10-CM | POA: Insufficient documentation

## 2012-03-12 DIAGNOSIS — Z79899 Other long term (current) drug therapy: Secondary | ICD-10-CM | POA: Insufficient documentation

## 2012-03-12 DIAGNOSIS — I10 Essential (primary) hypertension: Secondary | ICD-10-CM | POA: Insufficient documentation

## 2012-03-12 DIAGNOSIS — J189 Pneumonia, unspecified organism: Secondary | ICD-10-CM | POA: Insufficient documentation

## 2012-03-12 DIAGNOSIS — F17200 Nicotine dependence, unspecified, uncomplicated: Secondary | ICD-10-CM | POA: Insufficient documentation

## 2012-03-12 LAB — CBC WITH DIFFERENTIAL/PLATELET
Basophils Relative: 0 % (ref 0–1)
Eosinophils Absolute: 0.1 10*3/uL (ref 0.0–0.7)
Eosinophils Relative: 1 % (ref 0–5)
HCT: 36.6 % (ref 36.0–46.0)
Hemoglobin: 12.6 g/dL (ref 12.0–15.0)
MCH: 32.7 pg (ref 26.0–34.0)
MCHC: 34.4 g/dL (ref 30.0–36.0)
Monocytes Absolute: 0.5 10*3/uL (ref 0.1–1.0)
Monocytes Relative: 4 % (ref 3–12)

## 2012-03-12 LAB — BASIC METABOLIC PANEL
BUN: 17 mg/dL (ref 6–23)
Creatinine, Ser: 0.94 mg/dL (ref 0.50–1.10)
GFR calc Af Amer: 72 mL/min — ABNORMAL LOW (ref >90)
GFR calc non Af Amer: 62 mL/min — ABNORMAL LOW (ref >90)

## 2012-03-12 MED ORDER — AMOXICILLIN-POT CLAVULANATE ER 1000-62.5 MG PO TB12: 2.0000 | ORAL_TABLET | Freq: Two times a day (BID) | ORAL | Status: DC

## 2012-03-12 MED ORDER — AZITHROMYCIN 250 MG PO TABS: 250.0000 mg | ORAL_TABLET | Freq: Every day | ORAL | Status: DC

## 2012-03-12 NOTE — ED Provider Notes (Signed)
History    This chart was scribed for [employer]. Rubin Payor, MD by Charolett Bumpers, ED Scribe. The patient was seen in room APA08/APA08. Patient's care was started at 1150.   CSN: [id number]  Arrival date & time 03/12/12  1124   First MD Initiated Contact with Patient 03/12/12 1150      Chief Complaint  Patient presents with  . Chest Pain    The history is provided by the patient. No language interpreter was used.   Jackie Walls is a 67 y.o. female who presents to the Emergency Department complaining of intermittent right-sided sharp chest pain that started 2 days ago. She reports an associated productive cough that aggravates her pain. She reports her pain is worse at night and improves during the day. She denies any fevers, nausea or sick contacts. She reports a h/o similar chest pain when she had pneumonia previously. She is smoker.    Past Medical History  Diagnosis Date  . Hypertension   . Hyperlipidemia   . ALLERGIC RHINITIS   . Bronchitis     Past Surgical History  Procedure Laterality Date  . Coroted artery      NECK  . Cesarean section      Family History  Problem Relation Age of Onset  . Cancer Sister     History  Substance Use Topics  . Smoking status: Current Every Day Smoker -- 0.20 packs/day    Start date: 01/05/1948  . Smokeless tobacco: Not on file  . Alcohol Use: No    OB History   Grav Para Term Preterm Abortions TAB SAB Ect Mult Living                  Review of Systems  Constitutional: Negative for fever and chills.  Respiratory: Positive for cough. Negative for shortness of breath.   Cardiovascular: Positive for chest pain.  All other systems reviewed and are negative.    Allergies  Review of patient's allergies indicates no known allergies.  Home Medications   Current Outpatient Rx  Name  Route  Sig  Dispense  Refill  . albuterol (PROAIR HFA) 108 (90 BASE) MCG/ACT inhaler   Inhalation   Inhale 2 puffs into the lungs  every 6 (six) hours as needed for wheezing.         Marland Kitchen amLODipine (NORVASC) 5 MG tablet   Oral   Take 5 mg by mouth every morning.          . B Complex-C-Min-Fe-FA (HEMATINIC PLUS VIT/MINERALS PO)   Oral   Take 2 tablets by mouth every morning.          . budesonide-formoterol (SYMBICORT) 160-4.5 MCG/ACT inhaler   Inhalation   Inhale 2 puffs into the lungs 2 (two) times daily.           . cetirizine (ZYRTEC) 10 MG tablet   Oral   Take 10 mg by mouth daily as needed for allergies.          Marland Kitchen dextromethorphan-guaiFENesin (MUCINEX DM) 30-600 MG per 12 hr tablet   Oral   Take 1 tablet by mouth daily as needed (Congestion).         Marland Kitchen doxazosin (CARDURA) 4 MG tablet   Oral   Take 4 mg by mouth every morning.          . hydrochlorothiazide (HYDRODIURIL) 25 MG tablet   Oral   Take 25 mg by mouth every morning.          Marland Kitchen  lisinopril (PRINIVIL,ZESTRIL) 20 MG tablet   Oral   Take 20 mg by mouth every morning.          Marland Kitchen losartan (COZAAR) 50 MG tablet   Oral   Take 100 mg by mouth every morning.          . simvastatin (ZOCOR) 80 MG tablet   Oral   Take 80 mg by mouth at bedtime.           Marland Kitchen tiotropium (SPIRIVA) 18 MCG inhalation capsule   Inhalation   Place 18 mcg into inhaler and inhale daily as needed (Help with breathing).          . triamcinolone (NASACORT) 55 MCG/ACT nasal inhaler   Nasal   Place 2 sprays into the nose daily as needed (Allergies).         Marland Kitchen amoxicillin-clavulanate (AUGMENTIN XR) 1000-62.5 MG per tablet   Oral   Take 2 tablets by mouth 2 (two) times daily.   28 tablet   0   . azithromycin (ZITHROMAX) 250 MG tablet   Oral   Take 1 tablet (250 mg total) by mouth daily. Take first 2 tablets together, then 1 every day until finished.   6 tablet   0     BP 143/68  Pulse 68  Temp(Src) 97.8 F (36.6 C) (Oral)  Resp 22  SpO2 92%  Physical Exam  Nursing note and vitals reviewed. Constitutional: She is oriented to  person, place, and time. She appears well-developed and well-nourished. No distress.  HENT:  Head: Normocephalic and atraumatic.  Eyes: Conjunctivae and EOM are normal.  Neck: Normal range of motion. Neck supple. No tracheal deviation present.  Cardiovascular: Normal rate, regular rhythm and normal heart sounds.   Pulmonary/Chest: Effort normal. No respiratory distress. She has wheezes. She exhibits no tenderness.  Harsh wheezes on exam, worse on the right.  Abdominal: Soft. She exhibits no distension. There is no tenderness.  Musculoskeletal: Normal range of motion. She exhibits no edema.  Neurological: She is alert and oriented to person, place, and time.  Skin: Skin is warm and dry.  Psychiatric: She has a normal mood and affect. Her behavior is normal.    ED Course  Procedures (including critical care time)  DIAGNOSTIC STUDIES: Oxygen Saturation is 96% on room air, adequate by my interpretation.    COORDINATION OF CARE:  12:25-Discussed planned course of treatment with the patient including a chest x-ray and blood work, who is agreeable at this time.   Results for orders placed during the hospital encounter of 03/12/12  CBC WITH DIFFERENTIAL      Result Value Range   WBC 13.0 (*) 4.0 - 10.5 K/uL   RBC 3.85 (*) 3.87 - 5.11 MIL/uL   Hemoglobin 12.6  12.0 - 15.0 g/dL   HCT 45.4  09.8 - 11.9 %   MCV 95.1  78.0 - 100.0 fL   MCH 32.7  26.0 - 34.0 pg   MCHC 34.4  30.0 - 36.0 g/dL   RDW 14.7  82.9 - 56.2 %   Platelets 289  150 - 400 K/uL   Neutrophils Relative 87 (*) 43 - 77 %   Neutro Abs 11.3 (*) 1.7 - 7.7 K/uL   Lymphocytes Relative 8 (*) 12 - 46 %   Lymphs Abs 1.1  0.7 - 4.0 K/uL   Monocytes Relative 4  3 - 12 %   Monocytes Absolute 0.5  0.1 - 1.0 K/uL   Eosinophils Relative 1  0 -  5 %   Eosinophils Absolute 0.1  0.0 - 0.7 K/uL   Basophils Relative 0  0 - 1 %   Basophils Absolute 0.0  0.0 - 0.1 K/uL  BASIC METABOLIC PANEL      Result Value Range   Sodium 132 (*) 135 -  145 mEq/L   Potassium 3.8  3.5 - 5.1 mEq/L   Chloride 96  96 - 112 mEq/L   CO2 25  19 - 32 mEq/L   Glucose, Bld 87  70 - 99 mg/dL   BUN 17  6 - 23 mg/dL   Creatinine, Ser 1.61  0.50 - 1.10 mg/dL   Calcium 9.2  8.4 - 09.6 mg/dL   GFR calc non Af Amer 62 (*) >90 mL/min   GFR calc Af Amer 72 (*) >90 mL/min  TROPONIN I      Result Value Range   Troponin I <0.30  <0.30 ng/mL    Dg Chest 2 View  03/12/2012  *RADIOLOGY REPORT*  Clinical Data: Shortness of breath, cough, congestion  CHEST - 2 VIEW  Comparison: 02/15/2012  Findings: Patchy right lower lobe opacity, suspicious for pneumonia.  Suspected small right pleural effusion.  Additional mild linear opacity in the lateral right midlung, possibly scarring. No pleural effusion or pneumothorax.  The heart is normal in size.  Degenerative changes of the visualized thoracolumbar spine.  IMPRESSION: Patchy right lower lobe opacity, suspicious for pneumonia.  Suspected small right pleural effusion.   Original Report Authenticated By: Charline Bills, M.D.      1. CAP (community acquired pneumonia)     Date: 03/12/2012  Rate: 67  Rhythm: normal sinus rhythm  QRS Axis: normal  Intervals: normal  ST/T Wave abnormalities: normal  Conduction Disutrbances:none  Narrative Interpretation: LVH  Old EKG Reviewed: unchanged     MDM  Patient presents with cough and mild shortness of breath. She has some baseline COPD. She states her shortness of breath is not much worse than normal. X-ray shows pneumonia. Patient is not hypoxic and is able to ambulate without difficulty. She was recently on Levaquin and will be started on azithromycin and Augmentin. She'll follow with her primary care Dr.   I personally performed the services described in this documentation, which was scribed in my presence. The recorded information has been reviewed and is accurate.        Juliet Rude. Rubin Payor, MD 03/12/12 [phone]

## 2012-03-12 NOTE — ED Notes (Signed)
Pt ambulated in hallway without SOB. Pulse Ox stayed between 92% and 95%.

## 2012-03-12 NOTE — ED Notes (Signed)
Pt c/o intermittent right side chest pain that began 1-2 days ago. Pt also reports productive cough with yellow sputum. Pt c/o SOB but has hx of COPD and states "this is no worse than normal".

## 2012-11-10 ENCOUNTER — Encounter: Payer: Medicare Other | Admitting: Vascular Surgery

## 2012-11-13 ENCOUNTER — Encounter: Payer: Self-pay | Admitting: Vascular Surgery

## 2012-11-14 ENCOUNTER — Ambulatory Visit (INDEPENDENT_AMBULATORY_CARE_PROVIDER_SITE_OTHER): Payer: Medicare Other | Admitting: Vascular Surgery

## 2012-11-14 ENCOUNTER — Encounter: Payer: Self-pay | Admitting: Vascular Surgery

## 2012-11-14 VITALS — BP 156/95 | HR 97 | Resp 16 | Ht 63.0 in | Wt 132.5 lb

## 2012-11-14 DIAGNOSIS — I723 Aneurysm of iliac artery: Secondary | ICD-10-CM

## 2012-11-14 DIAGNOSIS — I6529 Occlusion and stenosis of unspecified carotid artery: Secondary | ICD-10-CM

## 2012-11-14 DIAGNOSIS — I714 Abdominal aortic aneurysm, without rupture: Secondary | ICD-10-CM

## 2012-11-14 NOTE — Progress Notes (Signed)
Subjective:     Patient ID: Jackie Walls, female   DOB: [birth year], 67 y.o.   MRN: [id number]  HPI this 67 year old female who is an old patient of mine referred by Dr. Lysbeth Galas for recently discovered abdominal aortic aneurysm. 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 This was not previously known. Patient denies any abdominal pain but does have chronic back symptoms. She also has severe COPD and is on home oxygen.  Past Medical History  Diagnosis Date  . Hypertension   . Hyperlipidemia   . ALLERGIC RHINITIS   . Bronchitis     History  Substance Use Topics  . Smoking status: Current Every Day Smoker -- 0.10 packs/day    Types: Cigarettes    Start date: 01/05/1948  . Smokeless tobacco: Not on file  . Alcohol Use: No    Family History  Problem Relation Age of Onset  . Cancer Sister     No Known Allergies  Current outpatient prescriptions:albuterol (PROAIR HFA) 108 (90 BASE) MCG/ACT inhaler, Inhale 2 puffs into the lungs every 6 (six) hours as needed for wheezing., Disp: , Rfl: ;  amLODipine (NORVASC) 5 MG tablet, Take 5 mg by mouth every morning. , Disp: , Rfl: ;  B Complex-C-Min-Fe-FA (HEMATINIC PLUS VIT/MINERALS PO), Take 2 tablets by mouth every morning. , Disp: , Rfl:  cetirizine (ZYRTEC) 10 MG tablet, Take 10 mg by mouth daily as needed for allergies. , Disp: , Rfl: ;  dextromethorphan-guaiFENesin (MUCINEX DM) 30-600 MG per 12 hr tablet, Take 1 tablet by mouth daily as needed (Congestion)., Disp: , Rfl: ;  doxazosin (CARDURA) 4 MG tablet, Take 4 mg by mouth every morning. , Disp: , Rfl: ;  losartan (COZAAR) 50 MG tablet, Take 100 mg by mouth every morning. , Disp: , Rfl:  triamcinolone (NASACORT) 55 MCG/ACT nasal inhaler, Place 2 sprays into the nose daily as needed (Allergies)., Disp: , Rfl: ;  amoxicillin-clavulanate (AUGMENTIN XR) 1000-62.5 MG per tablet, Take  2 tablets by mouth 2 (two) times daily., Disp: 28 tablet, Rfl: 0;  azithromycin (ZITHROMAX) 250 MG tablet, Take 1 tablet (250 mg total) by mouth daily. Take first 2 tablets together, then 1 every day until finished., Disp: 6 tablet, Rfl: 0 budesonide-formoterol (SYMBICORT) 160-4.5 MCG/ACT inhaler, Inhale 2 puffs into the lungs 2 (two) times daily.  , Disp: , Rfl: ;  hydrochlorothiazide (HYDRODIURIL) 25 MG tablet, Take 25 mg by mouth every morning. , Disp: , Rfl: ;  lisinopril (PRINIVIL,ZESTRIL) 20 MG tablet, Take 20 mg by mouth every morning. , Disp: , Rfl: ;  simvastatin (ZOCOR) 80 MG tablet, Take 80 mg by mouth at bedtime.  , Disp: , Rfl:  tiotropium (SPIRIVA) 18 MCG inhalation capsule, Place 18 mcg into inhaler and inhale daily as needed (Help with breathing). , Disp: , Rfl:   BP 156/95  Pulse 97  Resp 16  Ht 5\' 3"  (1.6 m)  Wt 132 lb 8 oz (60.102 kg)  BMI 23.48 kg/m2  SpO2 97%  Body mass index is 23.48 kg/(m^2).           Review of Systems has severe dyspnea on exertion but denies chest pain, lateralizing weakness, aphasia, amaurosis fugax, diplopia, blurred vision, syncope, or claudication. Other systems negative and complete review of systems     Objective:   Physical Exam BP 156/95  Pulse 97  Resp 16  Ht  5\' 3"  (1.6 m)  Wt 132 lb 8 oz (60.102 kg)  BMI 23.48 kg/m2  SpO2 97%  Gen.-alert and oriented x3 in no apparent distress-on nasal oxygen HEENT normal for age Lungs no rhonchi or wheezing Cardiovascular regular rhythm no murmurs carotid pulses 3+ palpable no bruits audible Abdomen soft nontender no palpable masses Musculoskeletal free of  major deformities Skin clear -no rashes Neurologic normal Lower extremities-2+ femoral pulses palpable bilaterally. No distal pulses palpable. The feet well perfused.  Today I reviewed the CT scan which was performed at [facility]. 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 She has diffuse atherosclerosis with a lot of plaque formation particularly in the neck of the aneurysm.        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

## 2012-11-14 NOTE — Addendum Note (Signed)
Addended by: Sharee Pimple on: 11/14/2012 11:44 AM   Modules accepted: Orders

## 2012-11-29 ENCOUNTER — Encounter: Payer: Self-pay | Admitting: Emergency Medicine

## 2013-03-15 ENCOUNTER — Emergency Department (HOSPITAL_COMMUNITY): Payer: Medicare Other

## 2013-03-15 ENCOUNTER — Encounter (HOSPITAL_COMMUNITY): Payer: Self-pay | Admitting: Emergency Medicine

## 2013-03-15 ENCOUNTER — Emergency Department (HOSPITAL_COMMUNITY)
Admission: EM | Admit: 2013-03-15 | Discharge: 2013-03-16 | Disposition: A | Discharge status: Home / Self Care | Payer: Medicare Other | Attending: Emergency Medicine | Admitting: Emergency Medicine

## 2013-03-15 DIAGNOSIS — E785 Hyperlipidemia, unspecified: Secondary | ICD-10-CM | POA: Insufficient documentation

## 2013-03-15 DIAGNOSIS — Y939 Activity, unspecified: Secondary | ICD-10-CM | POA: Insufficient documentation

## 2013-03-15 DIAGNOSIS — J4489 Other specified chronic obstructive pulmonary disease: Secondary | ICD-10-CM | POA: Insufficient documentation

## 2013-03-15 DIAGNOSIS — F17200 Nicotine dependence, unspecified, uncomplicated: Secondary | ICD-10-CM | POA: Insufficient documentation

## 2013-03-15 DIAGNOSIS — Z792 Long term (current) use of antibiotics: Secondary | ICD-10-CM | POA: Insufficient documentation

## 2013-03-15 DIAGNOSIS — J449 Chronic obstructive pulmonary disease, unspecified: Secondary | ICD-10-CM | POA: Insufficient documentation

## 2013-03-15 DIAGNOSIS — Y9229 Other specified public building as the place of occurrence of the external cause: Secondary | ICD-10-CM | POA: Insufficient documentation

## 2013-03-15 DIAGNOSIS — Z791 Long term (current) use of non-steroidal anti-inflammatories (NSAID): Secondary | ICD-10-CM | POA: Insufficient documentation

## 2013-03-15 DIAGNOSIS — S329XXA Fracture of unspecified parts of lumbosacral spine and pelvis, initial encounter for closed fracture: Secondary | ICD-10-CM | POA: Insufficient documentation

## 2013-03-15 DIAGNOSIS — W06XXXA Fall from bed, initial encounter: Secondary | ICD-10-CM | POA: Insufficient documentation

## 2013-03-15 DIAGNOSIS — Z79899 Other long term (current) drug therapy: Secondary | ICD-10-CM | POA: Insufficient documentation

## 2013-03-15 DIAGNOSIS — I10 Essential (primary) hypertension: Secondary | ICD-10-CM | POA: Insufficient documentation

## 2013-03-15 DIAGNOSIS — R269 Unspecified abnormalities of gait and mobility: Secondary | ICD-10-CM | POA: Insufficient documentation

## 2013-03-15 DIAGNOSIS — IMO0002 Reserved for concepts with insufficient information to code with codable children: Secondary | ICD-10-CM | POA: Insufficient documentation

## 2013-03-15 NOTE — Discharge Instructions (Signed)
Stable Pelvic Fracture, Adult You have one or more fractures (this means there is a break in the bones) of the pelvis. The pelvis is the ring of bones that make up your hipbones. These are the bones you sit on and the lower part of the spine. It is like a boney ring where your legs attach and which supports your upper body. You have an un-displaced fracture. This means the bones are in good position. The pelvic fracture you have is a simple (uncomplicated) fracture. DIAGNOSIS  X-rays usually diagnose these fractures. TREATMENT  The goals of treating pelvic fractures are to get the bones to heal in a good position. The patient should return to normal activities as soon as possible. Such fractures are often treated with normal bed rest and conservative measures.  HOME CARE INSTRUCTIONS   You should be on bed rest for as long as directed by your caregiver. Change positions of your legs every 1-2 hours to maintain good blood flow. You may sit as long as is tolerable. Following this, you may do usual activities, but avoid strenuous activities for as long as directed by your caregiver.  Only take over-the-counter or prescription medicines for pain, discomfort, or fever as directed by your caregiver.  Bed-rest may also be used for discomfort.  Resume your activities when you are able. Use a cain or crutch on the injured side to reduce pain while walking, as needed.  If you develop increased pain or discomfort not relieved with medications, contact your caregiver.  Warning: Do not drive a car or operate a motor vehicle until your caregiver specifically tells you it is safe to do so. SEEK IMMEDIATE MEDICAL CARE IF:   You feel light-headed or faint, develop chest pain or shortness of breath.  An unexplained oral temperature above 102 F (38.9 C) develops.  You develop blood in the urine or in the stools.  There is difficulty urinating, and/or having a bowel movement, or pain with these  efforts.  There is a difficulty or increased pain with walking.  There is swelling in one or both legs that is not normal. Document Released: 03/01/2001 Document Revised: 08/23/2012 Document Reviewed: 08/04/2007 [facility] Patient Information 2014 [place], [place].  You need to weight bear as tolerated using a walker for support as your injuries heal.  Dr Sherrie Sport is aware of your injuries.

## 2013-03-15 NOTE — ED Notes (Signed)
Pt states she fell out of bed while an inpatient @ [facility] for COPD,injured left hip at that time. Initial report was that the left hip was WNL. Subsequently was transferred to  [facility] nursing center for rehab but has been unable to do so secondary to persistant pain left hip. Family requested she be sent here for evaluation of left hip pain

## 2013-03-18 NOTE — ED Provider Notes (Signed)
CSN: [id number]     Arrival date & time 03/15/13  2113 History   First MD Initiated Contact with Patient 03/15/13 2152     Chief Complaint  Patient presents with  . Hip Pain     (Consider location/radiation/quality/duration/timing/severity/associated sxs/prior Treatment) HPI Comments: Jackie Walls is a 68 y.o. Female presenting with persistent left hip pain since she fell out of bed while she was hospitalized for a COPD exacerbation on 03/06/13 at [facility].  She underwent xrays of her hip and pelvis which were negative. She was discharged home the same day but was subsequent sent to [facility] rehab center for rehab as she was unable to bear weight without severe pain at home.  Family at bedside has requested re-evaluation of her hip pain.  She reports she is fairly comfortable at rest, but has severe non radiating sharp pain with attempts to weight bear or lift her left leg off the bed.  She denies back pain and has no numbness or weakness in her legs.  She takes hydrocodone prn for pain which offers little relief.     The history is provided by the patient.    Past Medical History  Diagnosis Date  . Hypertension   . Hyperlipidemia   . ALLERGIC RHINITIS   . Bronchitis    Past Surgical History  Procedure Laterality Date  . Cesarean section    . Carotid endarterectomy Right    Family History  Problem Relation Age of Onset  . Cancer Sister    History  Substance Use Topics  . Smoking status: Current Every Day Smoker -- 0.10 packs/day    Types: Cigarettes    Start date: 01/05/1948  . Smokeless tobacco: Not on file  . Alcohol Use: No   OB History   Grav Para Term Preterm Abortions TAB SAB Ect Mult Living                 Review of Systems  Constitutional: Negative for fever.  Musculoskeletal: Positive for arthralgias and gait problem. Negative for back pain, joint swelling and myalgias.  Neurological: Negative for weakness and numbness.      Allergies   Review of patient's allergies indicates no known allergies.  Home Medications   Current Outpatient Rx  Name  Route  Sig  Dispense  Refill  . albuterol (PROAIR HFA) 108 (90 BASE) MCG/ACT inhaler   Inhalation   Inhale 2 puffs into the lungs every 6 (six) hours as needed for wheezing.         Marland Kitchen alendronate (FOSAMAX) 70 MG tablet   Oral   Take 70 mg by mouth once a week. Take with a full glass of water on an empty stomach.         Marland Kitchen amLODipine (NORVASC) 5 MG tablet   Oral   Take 5 mg by mouth every morning.          Marland Kitchen atorvastatin (LIPITOR) 20 MG tablet   Oral   Take 20 mg by mouth every evening.         . B Complex-C-Min-Fe-FA (HEMATINIC PLUS VIT/MINERALS PO)   Oral   Take 1 tablet by mouth 2 (two) times daily.          . budesonide-formoterol (SYMBICORT) 160-4.5 MCG/ACT inhaler   Inhalation   Inhale 2 puffs into the lungs 2 (two) times daily.           . calcium-vitamin D (OSCAL WITH D) 500-200 MG-UNIT per tablet   Oral  Take 1 tablet by mouth 2 (two) times daily.         . cetirizine (ZYRTEC) 10 MG tablet   Oral   Take 10 mg by mouth every morning.          . cholecalciferol (VITAMIN D) 1000 UNITS tablet   Oral   Take 1,000 Units by mouth every evening.         Marland Kitchen doxazosin (CARDURA) 4 MG tablet   Oral   Take 4 mg by mouth every morning.          Marland Kitchen HYDROcodone-acetaminophen (NORCO/VICODIN) 5-325 MG per tablet   Oral   Take 1 tablet by mouth every 4 (four) hours as needed for moderate pain.         Marland Kitchen losartan (COZAAR) 50 MG tablet   Oral   Take 100 mg by mouth every morning.          . meloxicam (MOBIC) 7.5 MG tablet   Oral   Take 7.5 mg by mouth daily.         . Multiple Vitamin (MULTIVITAMIN WITH MINERALS) TABS tablet   Oral   Take 1 tablet by mouth daily.         Marland Kitchen amoxicillin-clavulanate (AUGMENTIN XR) 1000-62.5 MG per tablet   Oral   Take 2 tablets by mouth 2 (two) times daily.   28 tablet   0   . azithromycin  (ZITHROMAX) 250 MG tablet   Oral   Take 1 tablet (250 mg total) by mouth daily. Take first 2 tablets together, then 1 every day until finished.   6 tablet   0    BP 143/85  Pulse 84  Temp(Src) 97.5 F (36.4 C) (Oral)  Resp 24  Ht 5\' 5"  (1.651 m)  Wt 125 lb (56.7 kg)  BMI 20.80 kg/m2  SpO2 97% Physical Exam  Constitutional: She is oriented to person, place, and time. She appears well-developed and well-nourished.  HENT:  Head: Atraumatic.  Neck: Normal range of motion.  Cardiovascular:  Pulses:      Dorsalis pedis pulses are 2+ on the right side, and 2+ on the left side.  Pulses equal bilaterally  Musculoskeletal: She exhibits tenderness. She exhibits no edema.       Left hip: She exhibits decreased range of motion and tenderness. She exhibits no swelling.  Neurological: She is alert and oriented to person, place, and time. She has normal strength. She displays normal reflexes. No sensory deficit.  Skin: Skin is warm and dry.  Psychiatric: She has a normal mood and affect.    ED Course  Procedures (including critical care time) Labs Review Labs Reviewed - No data to display Imaging Review No results found.   EKG Interpretation None      MDM   Final diagnoses:  Pelvic fracture    Patients labs and/or radiological studies were viewed and considered during the medical decision making and disposition process. Discussed xray results with family and patient.  Call placed to Dr. Romona Curls who is taking care of this patient while in rehab and made aware of findings.  Pt will be transferred back pt rehab for ongoing care as her injury heals.  Pt discussed with Dr Roderic Palau prior to dc home.   Evalee Jefferson, PA-C 03/18/13 [phone]

## 2013-03-19 NOTE — ED Provider Notes (Signed)
Medical screening examination/treatment/procedure(s) were performed by non-physician practitioner and as supervising physician I was immediately available for consultation/collaboration.   EKG Interpretation None        Maudry Diego, MD 03/19/13 1438

## 2013-04-24 ENCOUNTER — Encounter (HOSPITAL_COMMUNITY): Payer: Self-pay | Admitting: Emergency Medicine

## 2013-04-24 ENCOUNTER — Inpatient Hospital Stay (HOSPITAL_COMMUNITY)
Admission: EM | Admit: 2013-04-24 | Discharge: 2013-05-02 | DRG: 326 | Disposition: A | Discharge status: Skilled Nursing Facility | Payer: Medicare Other | Attending: General Surgery | Admitting: General Surgery

## 2013-04-24 DIAGNOSIS — K659 Peritonitis, unspecified: Secondary | ICD-10-CM | POA: Diagnosis present

## 2013-04-24 DIAGNOSIS — I10 Essential (primary) hypertension: Secondary | ICD-10-CM | POA: Diagnosis present

## 2013-04-24 DIAGNOSIS — D62 Acute posthemorrhagic anemia: Secondary | ICD-10-CM | POA: Diagnosis not present

## 2013-04-24 DIAGNOSIS — E43 Unspecified severe protein-calorie malnutrition: Secondary | ICD-10-CM | POA: Insufficient documentation

## 2013-04-24 DIAGNOSIS — E785 Hyperlipidemia, unspecified: Secondary | ICD-10-CM | POA: Diagnosis present

## 2013-04-24 DIAGNOSIS — K631 Perforation of intestine (nontraumatic): Secondary | ICD-10-CM | POA: Diagnosis present

## 2013-04-24 DIAGNOSIS — K668 Other specified disorders of peritoneum: Secondary | ICD-10-CM

## 2013-04-24 DIAGNOSIS — E876 Hypokalemia: Secondary | ICD-10-CM | POA: Diagnosis present

## 2013-04-24 DIAGNOSIS — E871 Hypo-osmolality and hyponatremia: Secondary | ICD-10-CM | POA: Diagnosis present

## 2013-04-24 DIAGNOSIS — J4489 Other specified chronic obstructive pulmonary disease: Secondary | ICD-10-CM | POA: Diagnosis present

## 2013-04-24 DIAGNOSIS — F17200 Nicotine dependence, unspecified, uncomplicated: Secondary | ICD-10-CM | POA: Diagnosis present

## 2013-04-24 DIAGNOSIS — IMO0002 Reserved for concepts with insufficient information to code with codable children: Secondary | ICD-10-CM

## 2013-04-24 DIAGNOSIS — J449 Chronic obstructive pulmonary disease, unspecified: Secondary | ICD-10-CM | POA: Diagnosis present

## 2013-04-24 DIAGNOSIS — K255 Chronic or unspecified gastric ulcer with perforation: Principal | ICD-10-CM | POA: Diagnosis present

## 2013-04-24 HISTORY — DX: Chronic obstructive pulmonary disease, unspecified: J44.9

## 2013-04-24 LAB — COMPREHENSIVE METABOLIC PANEL
ALBUMIN: 2.7 g/dL — AB (ref 3.5–5.2)
ALK PHOS: 122 U/L — AB (ref 39–117)
ALT: 27 U/L (ref 0–35)
AST: 24 U/L (ref 0–37)
BILIRUBIN TOTAL: 0.2 mg/dL — AB (ref 0.3–1.2)
BUN: 16 mg/dL (ref 6–23)
CHLORIDE: 81 meq/L — AB (ref 96–112)
CO2: 25 meq/L (ref 19–32)
Calcium: 8.6 mg/dL (ref 8.4–10.5)
Creatinine, Ser: 0.65 mg/dL (ref 0.50–1.10)
GFR calc Af Amer: >90 mL/min (ref >90)
GFR, EST NON AFRICAN AMERICAN: 90 mL/min — AB (ref >90)
Glucose, Bld: 96 mg/dL (ref 70–99)
POTASSIUM: 3.1 meq/L — AB (ref 3.7–5.3)
Sodium: 120 mEq/L — CL (ref 137–147)
Total Protein: 5.8 g/dL — ABNORMAL LOW (ref 6.0–8.3)

## 2013-04-24 LAB — CBC WITH DIFFERENTIAL/PLATELET
Basophils Absolute: 0 10*3/uL (ref 0.0–0.1)
Basophils Relative: 0 % (ref 0–1)
Eosinophils Absolute: 0 10*3/uL (ref 0.0–0.7)
Eosinophils Relative: 0 % (ref 0–5)
HCT: 32.1 % — ABNORMAL LOW (ref 36.0–46.0)
Hemoglobin: 11.3 g/dL — ABNORMAL LOW (ref 12.0–15.0)
Lymphocytes Relative: 7 % — ABNORMAL LOW (ref 12–46)
Lymphs Abs: 0.9 10*3/uL (ref 0.7–4.0)
MCH: 31.4 pg (ref 26.0–34.0)
MCHC: 35.2 g/dL (ref 30.0–36.0)
MCV: 89.2 fL (ref 78.0–100.0)
Monocytes Absolute: 0.4 10*3/uL (ref 0.1–1.0)
Monocytes Relative: 4 % (ref 3–12)
Neutro Abs: 10.5 10*3/uL — ABNORMAL HIGH (ref 1.7–7.7)
Neutrophils Relative %: 89 % — ABNORMAL HIGH (ref 43–77)
Platelets: 399 10*3/uL (ref 150–400)
RBC: 3.6 MIL/uL — ABNORMAL LOW (ref 3.87–5.11)
RDW: 13.6 % (ref 11.5–15.5)
WBC: 11.8 10*3/uL — ABNORMAL HIGH (ref 4.0–10.5)

## 2013-04-24 LAB — LIPASE, BLOOD: Lipase: 18 U/L (ref 11–59)

## 2013-04-24 MED ORDER — SODIUM CHLORIDE 0.9 % IV BOLUS (SEPSIS)
1000.0000 mL | Freq: Once | INTRAVENOUS | Status: AC
  Administered 2013-04-24: 1000 mL via INTRAVENOUS

## 2013-04-24 MED ORDER — ONDANSETRON HCL 4 MG/2ML IJ SOLN
4.0000 mg | Freq: Once | INTRAMUSCULAR | Status: AC
  Administered 2013-04-24: 4 mg via INTRAVENOUS
  Filled 2013-04-24: qty 2

## 2013-04-24 MED ORDER — MORPHINE SULFATE 4 MG/ML IJ SOLN
4.0000 mg | Freq: Once | INTRAMUSCULAR | Status: AC
  Administered 2013-04-24: 4 mg via INTRAVENOUS
  Filled 2013-04-24: qty 1

## 2013-04-24 NOTE — ED Notes (Signed)
CRITICAL VALUE ALERT  Critical value received:  Sodium 120  Date of notification:  04/24/13  Time of notification:  2324  Critical value read back:yes  Nurse who received alert: Lucy Antigua, RN  MD notified (1st page):  Dr. Stark Jock

## 2013-04-24 NOTE — ED Provider Notes (Signed)
CSN: [id number]     Arrival date & time 04/24/13  2217 History  This chart was scribed for Veryl Speak, MD by Jenne Campus, ED Scribe. This patient was seen in room APA02/APA02 and the patient's care was started at 11:31 PM.    Chief Complaint  Patient presents with  . Abdominal Pain     The history is provided by the patient, the spouse and a relative. No language interpreter was used.   HPI Comments: Jackie Walls is a 68 y.o. female who presents to the Emergency Department complaining of constant RUQ and RLQ abdominal pain that started around 4 PM yesterday evening. She reports associated nasuated and emesis past 3 days but denies having any today. She followed up with her PCP yesterday morning and was prescribed Zofran with mild improvement. She also reports that she had a recheck last week for gout due to feet swelling and it was noted that she was hyponatremic. 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 She denies having any issues from that diagnosis.  She denies recent fevers. She has a h/o of COPD and smokes a 0.10 ppd.   Past Medical History  Diagnosis Date  . Hypertension   . Hyperlipidemia   . ALLERGIC RHINITIS   . Bronchitis    Past Surgical History  Procedure Laterality Date  . Cesarean section    . Carotid endarterectomy Right    Family History  Problem Relation Age of Onset  . Cancer Sister    History  Substance Use Topics  . Smoking status: Current Every Day Smoker -- 0.10 packs/day    Types: Cigarettes    Start date: 01/05/1948  . Smokeless tobacco: Not on file  . Alcohol Use: No   No OB history provided.  Review of Systems  A complete 10 system review of systems was obtained and all systems are negative except as noted in the HPI and PMH.    Allergies  Review of patient's allergies indicates no known allergies.  Home Medications   Prior to Admission medications   Medication Sig  Start Date End Date Taking? Authorizing Provider  albuterol (PROAIR HFA) 108 (90 BASE) MCG/ACT inhaler Inhale 2 puffs into the lungs every 6 (six) hours as needed for wheezing.    Historical Provider, MD  alendronate (FOSAMAX) 70 MG tablet Take 70 mg by mouth once a week. Take with a full glass of water on an empty stomach.    Historical Provider, MD  amLODipine (NORVASC) 5 MG tablet Take 5 mg by mouth every morning.     Historical Provider, MD  amoxicillin-clavulanate (AUGMENTIN XR) 1000-62.5 MG per tablet Take 2 tablets by mouth 2 (two) times daily. 03/12/12   Jasper Riling. Pickering, MD  atorvastatin (LIPITOR) 20 MG tablet Take 20 mg by mouth every evening.    Historical Provider, MD  azithromycin (ZITHROMAX) 250 MG tablet Take 1 tablet (250 mg total) by mouth daily. Take first 2 tablets together, then 1 every day until finished. 03/12/12   Jasper Riling. Pickering, MD  B Complex-C-Min-Fe-FA (HEMATINIC PLUS VIT/MINERALS PO) Take 1 tablet by mouth 2 (two) times daily.     Historical Provider, MD  budesonide-formoterol (SYMBICORT) 160-4.5 MCG/ACT inhaler Inhale 2 puffs into the lungs 2 (two) times daily.      Historical Provider, MD  calcium-vitamin D (OSCAL WITH D) 500-200 MG-UNIT per tablet Take 1 tablet by mouth 2 (two) times daily.  Historical Provider, MD  cetirizine (ZYRTEC) 10 MG tablet Take 10 mg by mouth every morning.     Historical Provider, MD  cholecalciferol (VITAMIN D) 1000 UNITS tablet Take 1,000 Units by mouth every evening.    Historical Provider, MD  doxazosin (CARDURA) 4 MG tablet Take 4 mg by mouth every morning.     Historical Provider, MD  HYDROcodone-acetaminophen (NORCO/VICODIN) 5-325 MG per tablet Take 1 tablet by mouth every 4 (four) hours as needed for moderate pain.    Historical Provider, MD  losartan (COZAAR) 50 MG tablet Take 100 mg by mouth every morning.     Historical Provider, MD  meloxicam (MOBIC) 7.5 MG tablet Take 7.5 mg by mouth daily.    Historical Provider, MD   Multiple Vitamin (MULTIVITAMIN WITH MINERALS) TABS tablet Take 1 tablet by mouth daily.    Historical Provider, MD   Triage vitals: BP 102/55  Pulse 88  Temp(Src) 97.9 F (36.6 C) (Oral)  Resp 20  SpO2 95%  Physical Exam  Nursing note and vitals reviewed. Constitutional: She is oriented to person, place, and time. She appears well-developed and well-nourished. No distress.  HENT:  Head: Normocephalic and atraumatic.  Eyes: Conjunctivae and EOM are normal.  Neck: Normal range of motion. Neck supple. No tracheal deviation present.  Cardiovascular: Normal rate, regular rhythm and normal heart sounds.   No murmur heard. Pulmonary/Chest: Effort normal and breath sounds normal. No respiratory distress. She has no wheezes. She has no rales.  Abdominal: Soft. Bowel sounds are normal. There is tenderness.  Tenderness to palpation in RUQ and RLQ. Voluntary guarding but no rebound.   Musculoskeletal: Normal range of motion. She exhibits no edema.  Neurological: She is alert and oriented to person, place, and time. No cranial nerve deficit.  Skin: Skin is warm and dry.  Psychiatric: She has a normal mood and affect. Her behavior is normal.    ED Course  Procedures (including critical care time)  Medications  iohexol (OMNIPAQUE) 300 MG/ML solution 100 mL (not administered)  morphine 4 MG/ML injection 4 mg (4 mg Intravenous Given 04/24/13 2345)  sodium chloride 0.9 % bolus 1,000 mL (0 mLs Intravenous Stopped 04/25/13 0052)  ondansetron (ZOFRAN) injection 4 mg (4 mg Intravenous Given 04/24/13 2345)  iohexol (OMNIPAQUE) 300 MG/ML solution 50 mL (50 mLs Oral Contrast Given 04/25/13 0022)    DIAGNOSTIC STUDIES: Oxygen Saturation is 95% on RA, Adequate by my interpretation.    COORDINATION OF CARE: 11:35 PM-Discussed treatment plan which includes CT of abdomen, medications, CBC panel, CMP, and UA with pt at bedside and pt agreed to plan.     Labs Review Labs Reviewed  CBC WITH DIFFERENTIAL  - Abnormal; Notable for the following:    WBC 11.8 (*)    RBC 3.60 (*)    Hemoglobin 11.3 (*)    HCT 32.1 (*)    Neutrophils Relative % 89 (*)    Neutro Abs 10.5 (*)    Lymphocytes Relative 7 (*)    All other components within normal limits  COMPREHENSIVE METABOLIC PANEL - Abnormal; Notable for the following:    Sodium 120 (*)    Potassium 3.1 (*)    Chloride 81 (*)    Total Protein 5.8 (*)    Albumin 2.7 (*)    Alkaline Phosphatase 122 (*)    Total Bilirubin 0.2 (*)    GFR calc non Af Amer 90 (*)    All other components within normal limits    Imaging Review  No results found.   EKG Interpretation   Date/Time:  Tuesday April 24 2013 22:35:22 EDT Ventricular Rate:  85 PR Interval:  134 QRS Duration: 78 QT Interval:  370 QTC Calculation: 440 R Axis:   -10 Text Interpretation:  Sinus rhythm with Premature atrial complexes  Moderate voltage criteria for LVH, may be normal variant Borderline ECG  When compared with ECG of 12-Mar-2012 10:43, Premature atrial complexes  are now Present Criteria for Septal infarct are no longer Present  Confirmed by DELOS  MD, Kawanda Drumheller ([id number]) on 04/25/2013 4:14:49 AM      MDM   Final diagnoses:  None   Patient is a 68 year old female who presents with a 24-hour history of right lower quadrant abdominal pain and vomiting. She was seen by her primary Dr. yesterday and was given Zofran for her nausea. She presents here tonight complaining of increased pain and weakness.  On exam, she is tender to palpation in the right lower quadrant and right upper quadrant. There is voluntary guarding but no rebound. She has an elevated white count and I feel as though a CT scan was indicated. This was performed and unfortunately revealed a moderate amount of intraperitoneal free air. The exact site of perforation was not identified by the CT scan. I have ordered Zosyn and have placed a call to Dr. Arnoldo Morale from general surgery.  In consultation with Dr.  Arnoldo Morale, she will be admitted to the hospital and he will take her to the operating room in the very near future.   I personally performed the services described in this documentation, which was scribed in my presence. The recorded information has been reviewed and is accurate.      Veryl Speak, MD 04/25/13 [id number]

## 2013-04-24 NOTE — ED Notes (Signed)
Acute onset of right lower quad abdominal pain, no vomiting today. Was seen at MD office today for vomiting yesterday and was given Zofran for nausea, that did help.

## 2013-04-25 ENCOUNTER — Encounter (HOSPITAL_COMMUNITY)
Admission: EM | Disposition: A | Discharge status: Skilled Nursing Facility | Payer: Self-pay | Source: Home / Self Care | Attending: General Surgery

## 2013-04-25 ENCOUNTER — Emergency Department (HOSPITAL_COMMUNITY): Payer: Medicare Other

## 2013-04-25 ENCOUNTER — Encounter (HOSPITAL_COMMUNITY): Payer: Medicare Other | Admitting: Anesthesiology

## 2013-04-25 ENCOUNTER — Encounter (HOSPITAL_COMMUNITY): Payer: Self-pay | Admitting: Emergency Medicine

## 2013-04-25 ENCOUNTER — Inpatient Hospital Stay (HOSPITAL_COMMUNITY): Payer: Medicare Other | Admitting: Anesthesiology

## 2013-04-25 DIAGNOSIS — K631 Perforation of intestine (nontraumatic): Secondary | ICD-10-CM | POA: Diagnosis present

## 2013-04-25 DIAGNOSIS — E43 Unspecified severe protein-calorie malnutrition: Secondary | ICD-10-CM | POA: Insufficient documentation

## 2013-04-25 DIAGNOSIS — K668 Other specified disorders of peritoneum: Secondary | ICD-10-CM | POA: Diagnosis present

## 2013-04-25 HISTORY — PX: GASTRORRHAPHY: SHX6263

## 2013-04-25 HISTORY — PX: LAPAROTOMY: SHX154

## 2013-04-25 LAB — URINALYSIS, ROUTINE W REFLEX MICROSCOPIC
BILIRUBIN URINE: NEGATIVE
GLUCOSE, UA: NEGATIVE mg/dL
Hgb urine dipstick: NEGATIVE
KETONES UR: NEGATIVE mg/dL
Nitrite: NEGATIVE
PH: 5.5 (ref 5.0–8.0)
PROTEIN: NEGATIVE mg/dL
Specific Gravity, Urine: 1.015 (ref 1.005–1.030)
Urobilinogen, UA: 0.2 mg/dL (ref 0.0–1.0)

## 2013-04-25 LAB — BASIC METABOLIC PANEL
BUN: 11 mg/dL (ref 6–23)
CO2: 25 mEq/L (ref 19–32)
CREATININE: 0.56 mg/dL (ref 0.50–1.10)
Calcium: 8 mg/dL — ABNORMAL LOW (ref 8.4–10.5)
Chloride: 89 mEq/L — ABNORMAL LOW (ref 96–112)
GFR calc non Af Amer: >90 mL/min (ref >90)
Glucose, Bld: 97 mg/dL (ref 70–99)
Potassium: 3.9 mEq/L (ref 3.7–5.3)
Sodium: 123 mEq/L — ABNORMAL LOW (ref 137–147)

## 2013-04-25 LAB — URINE MICROSCOPIC-ADD ON

## 2013-04-25 SURGERY — LAPAROTOMY, EXPLORATORY
Anesthesia: General | Site: Abdomen

## 2013-04-25 MED ORDER — ROCURONIUM BROMIDE 100 MG/10ML IV SOLN
INTRAVENOUS | Status: DC | PRN
  Administered 2013-04-25: 20 mg via INTRAVENOUS
  Administered 2013-04-25: 10 mg via INTRAVENOUS

## 2013-04-25 MED ORDER — SODIUM CHLORIDE 0.9 % IV SOLN
INTRAVENOUS | Status: DC
  Administered 2013-04-25: 08:00:00 via INTRAVENOUS

## 2013-04-25 MED ORDER — FENTANYL CITRATE 0.05 MG/ML IJ SOLN
INTRAMUSCULAR | Status: AC
  Filled 2013-04-25: qty 2

## 2013-04-25 MED ORDER — DIPHENHYDRAMINE HCL 50 MG/ML IJ SOLN: 12.5000 mg | Freq: Four times a day (QID) | INTRAMUSCULAR | Status: DC | PRN

## 2013-04-25 MED ORDER — POVIDONE-IODINE 10 % EX OINT
TOPICAL_OINTMENT | CUTANEOUS | Status: AC
  Filled 2013-04-25: qty 2

## 2013-04-25 MED ORDER — POTASSIUM CHLORIDE 10 MEQ/100ML IV SOLN
10.0000 meq | Freq: Once | INTRAVENOUS | Status: AC
  Administered 2013-04-25: 10 meq via INTRAVENOUS
  Filled 2013-04-25: qty 100

## 2013-04-25 MED ORDER — ACETAMINOPHEN 325 MG PO TABS
650.0000 mg | ORAL_TABLET | Freq: Four times a day (QID) | ORAL | Status: DC | PRN
  Administered 2013-05-01: 650 mg via ORAL
  Filled 2013-04-25: qty 2

## 2013-04-25 MED ORDER — FENTANYL CITRATE 0.05 MG/ML IJ SOLN
25.0000 ug | INTRAMUSCULAR | Status: DC | PRN
  Administered 2013-04-25 (×2): 25 ug via INTRAVENOUS

## 2013-04-25 MED ORDER — SODIUM CHLORIDE 0.9 % IN NEBU
INHALATION_SOLUTION | RESPIRATORY_TRACT | Status: AC
  Filled 2013-04-25: qty 6

## 2013-04-25 MED ORDER — CHLORHEXIDINE GLUCONATE 4 % EX LIQD: 1.0000 "application " | Freq: Once | CUTANEOUS | Status: DC

## 2013-04-25 MED ORDER — ACETAMINOPHEN 650 MG RE SUPP: 650.0000 mg | Freq: Four times a day (QID) | RECTAL | Status: DC | PRN

## 2013-04-25 MED ORDER — HYDROMORPHONE HCL PF 1 MG/ML IJ SOLN
1.0000 mg | INTRAMUSCULAR | Status: DC | PRN
  Administered 2013-04-25 – 2013-04-30 (×14): 1 mg via INTRAVENOUS
  Filled 2013-04-25 (×14): qty 1

## 2013-04-25 MED ORDER — AMLODIPINE BESYLATE 5 MG PO TABS
5.0000 mg | ORAL_TABLET | Freq: Every morning | ORAL | Status: DC
  Administered 2013-04-29 – 2013-05-02 (×4): 5 mg via ORAL
  Filled 2013-04-25 (×4): qty 1

## 2013-04-25 MED ORDER — SODIUM CHLORIDE 0.9 % IR SOLN
Status: DC | PRN
  Administered 2013-04-25 (×2): 1000 mL

## 2013-04-25 MED ORDER — ONDANSETRON HCL 4 MG/2ML IJ SOLN
4.0000 mg | Freq: Three times a day (TID) | INTRAMUSCULAR | Status: DC | PRN
Start: 2013-04-25 — End: 2013-04-25

## 2013-04-25 MED ORDER — PANTOPRAZOLE SODIUM 40 MG IV SOLR
40.0000 mg | Freq: Every day | INTRAVENOUS | Status: DC
  Administered 2013-04-25 – 2013-04-29 (×5): 40 mg via INTRAVENOUS
  Filled 2013-04-25 (×5): qty 40

## 2013-04-25 MED ORDER — SUCCINYLCHOLINE CHLORIDE 20 MG/ML IJ SOLN
INTRAMUSCULAR | Status: DC | PRN
  Administered 2013-04-25: 100 mg via INTRAVENOUS

## 2013-04-25 MED ORDER — SUFENTANIL CITRATE 50 MCG/ML IV SOLN
INTRAVENOUS | Status: DC | PRN
  Administered 2013-04-25 (×5): 10 ug via INTRAVENOUS

## 2013-04-25 MED ORDER — PHENYLEPHRINE HCL 10 MG/ML IJ SOLN
INTRAMUSCULAR | Status: AC
  Filled 2013-04-25: qty 1

## 2013-04-25 MED ORDER — DIPHENHYDRAMINE HCL 12.5 MG/5ML PO ELIX: 12.5000 mg | ORAL_SOLUTION | Freq: Four times a day (QID) | ORAL | Status: DC | PRN

## 2013-04-25 MED ORDER — GLYCOPYRROLATE 0.2 MG/ML IJ SOLN
0.2000 mg | Freq: Once | INTRAMUSCULAR | Status: AC
  Administered 2013-04-25: 0.2 mg via INTRAVENOUS

## 2013-04-25 MED ORDER — PIPERACILLIN-TAZOBACTAM 3.375 G IVPB
3.3750 g | Freq: Three times a day (TID) | INTRAVENOUS | Status: DC
  Administered 2013-04-25 – 2013-05-02 (×21): 3.375 g via INTRAVENOUS
  Filled 2013-04-25 (×28): qty 50

## 2013-04-25 MED ORDER — NEOSTIGMINE METHYLSULFATE 1 MG/ML IJ SOLN
INTRAMUSCULAR | Status: DC | PRN
  Administered 2013-04-25: 1 mg via INTRAVENOUS
  Administered 2013-04-25: 4 mg via INTRAVENOUS

## 2013-04-25 MED ORDER — EPHEDRINE SULFATE 50 MG/ML IJ SOLN
INTRAMUSCULAR | Status: AC
  Filled 2013-04-25: qty 1

## 2013-04-25 MED ORDER — ENALAPRILAT 1.25 MG/ML IV SOLN
1.2500 mg | Freq: Four times a day (QID) | INTRAVENOUS | Status: DC
  Administered 2013-04-25 – 2013-04-29 (×13): 1.25 mg via INTRAVENOUS
  Filled 2013-04-25 (×14): qty 2

## 2013-04-25 MED ORDER — SODIUM CHLORIDE BACTERIOSTATIC 0.9 % IJ SOLN
INTRAMUSCULAR | Status: AC
  Filled 2013-04-25: qty 10

## 2013-04-25 MED ORDER — ALBUTEROL SULFATE (2.5 MG/3ML) 0.083% IN NEBU
INHALATION_SOLUTION | RESPIRATORY_TRACT | Status: AC
  Filled 2013-04-25: qty 3

## 2013-04-25 MED ORDER — PIPERACILLIN-TAZOBACTAM 3.375 G IVPB 30 MIN
3.3750 g | Freq: Once | INTRAVENOUS | Status: AC
  Administered 2013-04-25: 3.375 g via INTRAVENOUS
  Filled 2013-04-25 (×2): qty 50

## 2013-04-25 MED ORDER — POVIDONE-IODINE 10 % OINT PACKET
TOPICAL_OINTMENT | CUTANEOUS | Status: DC | PRN
  Administered 2013-04-25: 2 via TOPICAL

## 2013-04-25 MED ORDER — ENOXAPARIN SODIUM 40 MG/0.4ML ~~LOC~~ SOLN
40.0000 mg | SUBCUTANEOUS | Status: DC
Start: 2013-04-25 — End: 2013-04-26
  Administered 2013-04-25: 40 mg via SUBCUTANEOUS
  Filled 2013-04-25: qty 0.4

## 2013-04-25 MED ORDER — POTASSIUM CHLORIDE 10 MEQ/100ML IV SOLN
INTRAVENOUS | Status: AC
  Administered 2013-04-25: 10 meq
  Filled 2013-04-25: qty 100

## 2013-04-25 MED ORDER — DEXAMETHASONE SODIUM PHOSPHATE 4 MG/ML IJ SOLN
INTRAMUSCULAR | Status: AC
  Filled 2013-04-25: qty 1

## 2013-04-25 MED ORDER — ONDANSETRON HCL 4 MG/2ML IJ SOLN
INTRAMUSCULAR | Status: AC
  Filled 2013-04-25: qty 2

## 2013-04-25 MED ORDER — ETOMIDATE 2 MG/ML IV SOLN
INTRAVENOUS | Status: AC
  Filled 2013-04-25: qty 10

## 2013-04-25 MED ORDER — SUCCINYLCHOLINE CHLORIDE 20 MG/ML IJ SOLN
INTRAMUSCULAR | Status: AC
  Filled 2013-04-25: qty 1

## 2013-04-25 MED ORDER — HYDROMORPHONE HCL PF 1 MG/ML IJ SOLN: 1.0000 mg | INTRAMUSCULAR | Status: DC | PRN

## 2013-04-25 MED ORDER — SODIUM CHLORIDE 0.9 % IV SOLN
INTRAVENOUS | Status: DC | PRN
  Administered 2013-04-25 (×2): via INTRAVENOUS

## 2013-04-25 MED ORDER — ONDANSETRON HCL 4 MG/2ML IJ SOLN: 4.0000 mg | Freq: Once | INTRAMUSCULAR | Status: DC | PRN

## 2013-04-25 MED ORDER — DOXAZOSIN MESYLATE 2 MG PO TABS
4.0000 mg | ORAL_TABLET | Freq: Every morning | ORAL | Status: DC
  Administered 2013-04-29 – 2013-05-02 (×4): 4 mg via ORAL
  Filled 2013-04-25 (×5): qty 2

## 2013-04-25 MED ORDER — SODIUM CHLORIDE 0.9 % IV SOLN
INTRAVENOUS | Status: DC
  Administered 2013-04-25: 11:00:00 via INTRAVENOUS

## 2013-04-25 MED ORDER — SODIUM CHLORIDE 0.9 % IV SOLN
INTRAVENOUS | Status: DC
  Administered 2013-04-25 – 2013-04-26 (×2): via INTRAVENOUS
  Administered 2013-04-27: 1000 mL via INTRAVENOUS

## 2013-04-25 MED ORDER — ROCURONIUM BROMIDE 50 MG/5ML IV SOLN
INTRAVENOUS | Status: AC
  Filled 2013-04-25: qty 1

## 2013-04-25 MED ORDER — ARTIFICIAL TEARS OP OINT
TOPICAL_OINTMENT | OPHTHALMIC | Status: AC
  Filled 2013-04-25: qty 3.5

## 2013-04-25 MED ORDER — LOSARTAN POTASSIUM 50 MG PO TABS
100.0000 mg | ORAL_TABLET | Freq: Every morning | ORAL | Status: DC
  Administered 2013-04-29 – 2013-05-02 (×4): 100 mg via ORAL
  Filled 2013-04-25 (×4): qty 2

## 2013-04-25 MED ORDER — GLYCOPYRROLATE 0.2 MG/ML IJ SOLN
INTRAMUSCULAR | Status: DC | PRN
  Administered 2013-04-25: 0.6 mg via INTRAVENOUS

## 2013-04-25 MED ORDER — MENTHOL 3 MG MT LOZG
1.0000 | LOZENGE | OROMUCOSAL | Status: DC | PRN
  Administered 2013-04-27: 3 mg via ORAL
  Filled 2013-04-25 (×3): qty 9

## 2013-04-25 MED ORDER — ONDANSETRON HCL 4 MG/2ML IJ SOLN
4.0000 mg | Freq: Once | INTRAMUSCULAR | Status: AC
  Administered 2013-04-25: 4 mg via INTRAVENOUS

## 2013-04-25 MED ORDER — SUFENTANIL CITRATE 50 MCG/ML IV SOLN
INTRAVENOUS | Status: AC
  Filled 2013-04-25: qty 1

## 2013-04-25 MED ORDER — DEXAMETHASONE SODIUM PHOSPHATE 4 MG/ML IJ SOLN
4.0000 mg | Freq: Once | INTRAMUSCULAR | Status: AC
  Administered 2013-04-25: 4 mg via INTRAVENOUS

## 2013-04-25 MED ORDER — GLYCOPYRROLATE 0.2 MG/ML IJ SOLN
INTRAMUSCULAR | Status: AC
  Filled 2013-04-25: qty 3

## 2013-04-25 MED ORDER — IOHEXOL 300 MG/ML  SOLN
50.0000 mL | Freq: Once | INTRAMUSCULAR | Status: AC | PRN
  Administered 2013-04-25: 50 mL via ORAL

## 2013-04-25 MED ORDER — FENTANYL CITRATE 0.05 MG/ML IJ SOLN: 25.0000 ug | INTRAMUSCULAR | Status: DC | PRN

## 2013-04-25 MED ORDER — LIDOCAINE HCL (CARDIAC) 20 MG/ML IV SOLN
INTRAVENOUS | Status: DC | PRN
  Administered 2013-04-25: 50 mg via INTRAVENOUS

## 2013-04-25 MED ORDER — MIDAZOLAM HCL 2 MG/2ML IJ SOLN
1.0000 mg | INTRAMUSCULAR | Status: DC | PRN
Start: 2013-04-25 — End: 2013-04-25
  Administered 2013-04-25: 2 mg via INTRAVENOUS

## 2013-04-25 MED ORDER — LACTATED RINGERS IV SOLN: INTRAVENOUS | Status: DC | PRN

## 2013-04-25 MED ORDER — ALBUTEROL SULFATE (2.5 MG/3ML) 0.083% IN NEBU
2.5000 mg | INHALATION_SOLUTION | RESPIRATORY_TRACT | Status: DC
  Administered 2013-04-25 – 2013-04-28 (×21): 2.5 mg via RESPIRATORY_TRACT
  Filled 2013-04-25 (×20): qty 3

## 2013-04-25 MED ORDER — ONDANSETRON HCL 4 MG/2ML IJ SOLN: 4.0000 mg | Freq: Four times a day (QID) | INTRAMUSCULAR | Status: DC | PRN

## 2013-04-25 MED ORDER — NICOTINE 21 MG/24HR TD PT24
21.0000 mg | MEDICATED_PATCH | Freq: Every day | TRANSDERMAL | Status: DC
  Administered 2013-04-25 – 2013-05-02 (×7): 21 mg via TRANSDERMAL
  Filled 2013-04-25 (×8): qty 1

## 2013-04-25 MED ORDER — GLYCOPYRROLATE 0.2 MG/ML IJ SOLN
INTRAMUSCULAR | Status: AC
  Filled 2013-04-25: qty 1

## 2013-04-25 MED ORDER — PROPOFOL 10 MG/ML IV BOLUS
INTRAVENOUS | Status: DC | PRN
  Administered 2013-04-25: 100 mg via INTRAVENOUS

## 2013-04-25 MED ORDER — POTASSIUM CHLORIDE 10 MEQ/100ML IV SOLN
10.0000 meq | Freq: Once | INTRAVENOUS | Status: AC
  Filled 2013-04-25: qty 100

## 2013-04-25 MED ORDER — MIDAZOLAM HCL 2 MG/2ML IJ SOLN
INTRAMUSCULAR | Status: AC
  Filled 2013-04-25: qty 2

## 2013-04-25 MED ORDER — POTASSIUM CHLORIDE 10 MEQ/100ML IV SOLN
10.0000 meq | Freq: Once | INTRAVENOUS | Status: AC
  Administered 2013-04-25: 10 meq via INTRAVENOUS

## 2013-04-25 MED ORDER — SODIUM CHLORIDE 0.9 % IV SOLN
INTRAVENOUS | Status: AC
  Administered 2013-04-25: 03:00:00 via INTRAVENOUS

## 2013-04-25 MED ORDER — SODIUM CHLORIDE BACTERIOSTATIC 0.9 % IJ SOLN
INTRAMUSCULAR | Status: AC
  Filled 2013-04-25: qty 20

## 2013-04-25 MED ORDER — IOHEXOL 300 MG/ML  SOLN
100.0000 mL | Freq: Once | INTRAMUSCULAR | Status: AC | PRN
  Administered 2013-04-25: 100 mL via INTRAVENOUS

## 2013-04-25 MED ORDER — KETOROLAC TROMETHAMINE 30 MG/ML IJ SOLN
30.0000 mg | Freq: Once | INTRAMUSCULAR | Status: AC
  Administered 2013-04-25: 30 mg via INTRAVENOUS
  Filled 2013-04-25: qty 1

## 2013-04-25 MED ORDER — LACTATED RINGERS IV SOLN: INTRAVENOUS | Status: DC

## 2013-04-25 SURGICAL SUPPLY — 59 items
APPLIER CLIP 11 MED OPEN (CLIP)
APPLIER CLIP 13 LRG OPEN (CLIP)
BAG HAMPER (MISCELLANEOUS) ×3 IMPLANT
BARRIER SKIN 2 3/4 (OSTOMY) IMPLANT
BARRIER SKIN 2 3/4 INCH (OSTOMY)
BLADE 10 SAFETY STRL DISP (BLADE) ×6 IMPLANT
CHLORAPREP W/TINT 26ML (MISCELLANEOUS) ×3 IMPLANT
CLAMP POUCH DRAINAGE QUIET (OSTOMY) IMPLANT
CLIP APPLIE 11 MED OPEN (CLIP) IMPLANT
CLIP APPLIE 13 LRG OPEN (CLIP) IMPLANT
CLOTH BEACON ORANGE TIMEOUT ST (SAFETY) ×3 IMPLANT
COVER LIGHT HANDLE STERIS (MISCELLANEOUS) ×6 IMPLANT
DRAPE WARM FLUID 44X44 (DRAPE) ×3 IMPLANT
DRSG OPSITE POSTOP 4X10 (GAUZE/BANDAGES/DRESSINGS) IMPLANT
ELECT BLADE 6 FLAT ULTRCLN (ELECTRODE) IMPLANT
ELECT REM PT RETURN 9FT ADLT (ELECTROSURGICAL) ×3
ELECTRODE REM PT RTRN 9FT ADLT (ELECTROSURGICAL) ×1 IMPLANT
GLOVE BIO SURGEON STRL SZ7.5 (GLOVE) ×6 IMPLANT
GLOVE BIOGEL M 7.0 STRL (GLOVE) ×3 IMPLANT
GLOVE BIOGEL PI IND STRL 7.0 (GLOVE) ×2 IMPLANT
GLOVE BIOGEL PI INDICATOR 7.0 (GLOVE) ×4
GLOVE ECLIPSE 7.0 STRL STRAW (GLOVE) ×3 IMPLANT
GLOVE EXAM NITRILE MD LF STRL (GLOVE) ×3 IMPLANT
GOWN STRL REUS W/TWL LRG LVL3 (GOWN DISPOSABLE) ×9 IMPLANT
INST SET MAJOR GENERAL (KITS) ×3 IMPLANT
KIT REMOVER STAPLE SKIN (MISCELLANEOUS) IMPLANT
KIT ROOM TURNOVER APOR (KITS) ×3 IMPLANT
LIGASURE IMPACT 36 18CM CVD LR (INSTRUMENTS) IMPLANT
MANIFOLD NEPTUNE II (INSTRUMENTS) ×3 IMPLANT
NS IRRIG 1000ML POUR BTL (IV SOLUTION) ×6 IMPLANT
PACK ABDOMINAL MAJOR (CUSTOM PROCEDURE TRAY) ×3 IMPLANT
PAD ARMBOARD 7.5X6 YLW CONV (MISCELLANEOUS) ×3 IMPLANT
POUCH OSTOMY 2 3/4  H 3804 (WOUND CARE)
POUCH OSTOMY 2 PC DRNBL 2.75 (WOUND CARE) IMPLANT
RELOAD LINEAR CUT PROX 55 BLUE (ENDOMECHANICALS) IMPLANT
RELOAD PROXIMATE 75MM BLUE (ENDOMECHANICALS) IMPLANT
RETRACTOR WND ALEXIS 25 LRG (MISCELLANEOUS) IMPLANT
RTRCTR WOUND ALEXIS 25CM LRG (MISCELLANEOUS)
SET BASIN LINEN APH (SET/KITS/TRAYS/PACK) ×3 IMPLANT
SPONGE DRAIN TRACH 4X4 STRL 2S (GAUZE/BANDAGES/DRESSINGS) ×3 IMPLANT
SPONGE GAUZE 4X4 12PLY (GAUZE/BANDAGES/DRESSINGS) ×3 IMPLANT
SPONGE LAP 18X18 X RAY DECT (DISPOSABLE) ×3 IMPLANT
STAPLER GUN LINEAR PROX 60 (STAPLE) IMPLANT
STAPLER PROXIMATE 55 BLUE (STAPLE) IMPLANT
STAPLER PROXIMATE 75MM BLUE (STAPLE) IMPLANT
STAPLER VISISTAT (STAPLE) ×3 IMPLANT
SUCTION POOLE TIP (SUCTIONS) ×3 IMPLANT
SUT CHROMIC 0 SH (SUTURE) IMPLANT
SUT CHROMIC 2 0 SH (SUTURE) IMPLANT
SUT CHROMIC 3 0 SH 27 (SUTURE) ×3 IMPLANT
SUT NOVA NAB GS-26 0 60 (SUTURE) IMPLANT
SUT PDS AB CT VIOLET #0 27IN (SUTURE) ×6 IMPLANT
SUT PROLENE 2 0 SH 30 (SUTURE) ×3 IMPLANT
SUT SILK 2 0 (SUTURE) ×2
SUT SILK 2 0 REEL (SUTURE) ×3 IMPLANT
SUT SILK 2-0 18XBRD TIE 12 (SUTURE) ×1 IMPLANT
SUT SILK 3 0 SH CR/8 (SUTURE) ×3 IMPLANT
TAPE CLOTH SURG 4X10 WHT LF (GAUZE/BANDAGES/DRESSINGS) ×3 IMPLANT
TRAY FOLEY CATH 16FR SILVER (SET/KITS/TRAYS/PACK) ×3 IMPLANT

## 2013-04-25 NOTE — Progress Notes (Signed)
Monitoring pt and her VS are stable and mentation has not changed. She has saturated two abdominal pads since she was transferred to floor. MD is aware and asked nursing to monitor pt and to change dressing as needed. Will continue to monitor for any change in status and will continue to reinforce/change dressing as needed. Will continue to monitor.

## 2013-04-25 NOTE — Anesthesia Preprocedure Evaluation (Addendum)
Anesthesia Evaluation  Patient identified by MRN, date of birth, ID band Patient awake    Reviewed: Allergy & Precautions, H&P , NPO status , Patient's Chart, lab work & pertinent test results  Airway Mallampati: II TM Distance: >3 FB Neck ROM: Full    Dental  (+) Edentulous Upper, Edentulous Lower   Pulmonary pneumonia - (prod cough), COPDCurrent Smoker,  + rhonchi         Cardiovascular hypertension, Pt. on medications + Peripheral Vascular Disease Rhythm:Regular Rate:Normal     Neuro/Psych    GI/Hepatic Perforated viscous with free air    Endo/Other    Renal/GU      Musculoskeletal   Abdominal   Peds  Hematology   Anesthesia Other Findings   Reproductive/Obstetrics                          Anesthesia Physical Anesthesia Plan  ASA: III  Anesthesia Plan: General   Post-op Pain Management:    Induction: Intravenous, Rapid sequence and Cricoid pressure planned  Airway Management Planned: Oral ETT  Additional Equipment:   Intra-op Plan:   Post-operative Plan: Extubation in OR and Possible Post-op intubation/ventilation  Informed Consent: I have reviewed the patients History and Physical, chart, labs and discussed the procedure including the risks, benefits and alternatives for the proposed anesthesia with the patient or authorized representative who has indicated his/her understanding and acceptance.     Plan Discussed with: CRNA and Surgeon  Anesthesia Plan Comments: (Possible post op ventilator support discussed. Albuterol jet nebs pre op.)        Anesthesia Quick Evaluation

## 2013-04-25 NOTE — ED Notes (Signed)
House phone [phone]

## 2013-04-25 NOTE — Progress Notes (Signed)
Since 9169 pt has saturated 3 abd binder dressings and a pressure dressing with blood. Pt is actively bleeding from middle of incision. VS are stable, pt is arousable, and pt is alert and oriented x4. MD has been made aware and as pt is asymptomatic he wants to continue monitoring vital signs and to continue monitoring site. Will continue to monitor.

## 2013-04-25 NOTE — Progress Notes (Signed)
UR chart review completed.  

## 2013-04-25 NOTE — Transfer of Care (Signed)
Immediate Anesthesia Transfer of Care Note  Patient: Jackie Walls  Procedure(s) Performed: Procedure(s): EXPLORATORY LAPAROTOMY (N/A) GASTRORRHAPHY (N/A)  Patient Location: PACU  Anesthesia Type:General  Level of Consciousness: unresponsive and confused  Airway & Oxygen Therapy: Patient Spontanous Breathing and Patient connected to face mask oxygen  Post-op Assessment: Report given to PACU RN and Post -op Vital signs reviewed and stable  Post vital signs: Reviewed and stable  Complications: No apparent anesthesia complications

## 2013-04-25 NOTE — H&P (Signed)
Jackie Walls is an 68 y.o. female.   Chief Complaint: Abdominal pain HPI: Patient is a 68 year old white female multiple medical problems who was in her usual state of health when she went to her primary care physician yesterday for worsening nausea and abdominal pain. She was told that time that her sodium was low. She went home but later that evening developed worsening abdominal pain. She presented the emergency room and was found on CT scan the abdomen to have pneumoperitoneum. Patient states she was taking an aspirin, but denied any alcohol use or nonsteroidal anti-inflammatory use other than Mobic.  Past Medical History  Diagnosis Date  . Hypertension   . Hyperlipidemia   . ALLERGIC RHINITIS   . Bronchitis   . COPD (chronic obstructive pulmonary disease)     Past Surgical History  Procedure Laterality Date  . Cesarean section    . Carotid endarterectomy Right     Family History  Problem Relation Age of Onset  . Cancer Sister    Social History:  reports that she has been smoking Cigarettes.  She started smoking about 65 years ago. She has been smoking about 0.10 packs per day. She does not have any smokeless tobacco history on file. She reports that she does not drink alcohol or use illicit drugs.  Allergies: No Known Allergies  Medications Prior to Admission  Medication Sig Dispense Refill  . albuterol (PROAIR HFA) 108 (90 BASE) MCG/ACT inhaler Inhale 2 puffs into the lungs every 6 (six) hours as needed for wheezing.      Marland Kitchen alendronate (FOSAMAX) 70 MG tablet Take 70 mg by mouth once a week. Take with a full glass of water on an empty stomach.      . B Complex-C-Min-Fe-FA (HEMATINIC PLUS VIT/MINERALS PO) Take 1 tablet by mouth 2 (two) times daily.       . budesonide-formoterol (SYMBICORT) 160-4.5 MCG/ACT inhaler Inhale 2 puffs into the lungs 2 (two) times daily.        . calcium-vitamin D (OSCAL WITH D) 500-200 MG-UNIT per tablet Take 1 tablet by mouth 2 (two) times daily.       . cetirizine (ZYRTEC) 10 MG tablet Take 10 mg by mouth every morning.       . cholecalciferol (VITAMIN D) 1000 UNITS tablet Take 1,000 Units by mouth every evening.      Marland Kitchen doxazosin (CARDURA) 4 MG tablet Take 4 mg by mouth every morning.       . Fluticasone Furoate-Vilanterol (BREO ELLIPTA) 100-25 MCG/INH AEPB Inhale 1 Inhaler into the lungs daily.      . hydrochlorothiazide (MICROZIDE) 12.5 MG capsule Take 12.5 mg by mouth daily.      Marland Kitchen HYDROcodone-acetaminophen (NORCO/VICODIN) 5-325 MG per tablet Take 1 tablet by mouth every 4 (four) hours as needed for moderate pain.      Marland Kitchen losartan (COZAAR) 50 MG tablet Take 100 mg by mouth every morning.       . meloxicam (MOBIC) 7.5 MG tablet Take 7.5 mg by mouth 2 (two) times daily.       . ondansetron (ZOFRAN-ODT) 4 MG disintegrating tablet Take 4 mg by mouth every 8 (eight) hours as needed for nausea or vomiting.      Marland Kitchen amLODipine (NORVASC) 5 MG tablet Take 5 mg by mouth every morning.       Marland Kitchen amoxicillin-clavulanate (AUGMENTIN XR) 1000-62.5 MG per tablet Take 2 tablets by mouth 2 (two) times daily.  28 tablet  0  . atorvastatin (LIPITOR)  20 MG tablet Take 20 mg by mouth every evening.      Marland Kitchen azithromycin (ZITHROMAX) 250 MG tablet Take 1 tablet (250 mg total) by mouth daily. Take first 2 tablets together, then 1 every day until finished.  6 tablet  0  . Multiple Vitamin (MULTIVITAMIN WITH MINERALS) TABS tablet Take 1 tablet by mouth daily.        Results for orders placed during the hospital encounter of 04/24/13 (from the past 48 hour(s))  CBC WITH DIFFERENTIAL     Status: Abnormal   Collection Time    04/24/13 10:34 PM      Result Value Ref Range   WBC 11.8 (*) 4.0 - 10.5 K/uL   RBC 3.60 (*) 3.87 - 5.11 MIL/uL   Hemoglobin 11.3 (*) 12.0 - 15.0 g/dL   HCT 32.1 (*) 36.0 - 46.0 %   MCV 89.2  78.0 - 100.0 fL   MCH 31.4  26.0 - 34.0 pg   MCHC 35.2  30.0 - 36.0 g/dL   RDW 13.6  11.5 - 15.5 %   Platelets 399  150 - 400 K/uL   Neutrophils Relative  % 89 (*) 43 - 77 %   Neutro Abs 10.5 (*) 1.7 - 7.7 K/uL   Lymphocytes Relative 7 (*) 12 - 46 %   Lymphs Abs 0.9  0.7 - 4.0 K/uL   Monocytes Relative 4  3 - 12 %   Monocytes Absolute 0.4  0.1 - 1.0 K/uL   Eosinophils Relative 0  0 - 5 %   Eosinophils Absolute 0.0  0.0 - 0.7 K/uL   Basophils Relative 0  0 - 1 %   Basophils Absolute 0.0  0.0 - 0.1 K/uL  COMPREHENSIVE METABOLIC PANEL     Status: Abnormal   Collection Time    04/24/13 10:34 PM      Result Value Ref Range   Sodium 120 (*) 137 - 147 mEq/L   Comment: CRITICAL RESULT CALLED TO, READ BACK BY AND VERIFIED WITH:     YARBOROUGH C AT 2325 ON [id number] BY FORSYTH K   Potassium 3.1 (*) 3.7 - 5.3 mEq/L   Chloride 81 (*) 96 - 112 mEq/L   CO2 25  19 - 32 mEq/L   Glucose, Bld 96  70 - 99 mg/dL   BUN 16  6 - 23 mg/dL   Creatinine, Ser 0.65  0.50 - 1.10 mg/dL   Calcium 8.6  8.4 - 10.5 mg/dL   Total Protein 5.8 (*) 6.0 - 8.3 g/dL   Albumin 2.7 (*) 3.5 - 5.2 g/dL   AST 24  0 - 37 U/L   ALT 27  0 - 35 U/L   Alkaline Phosphatase 122 (*) 39 - 117 U/L   Total Bilirubin 0.2 (*) 0.3 - 1.2 mg/dL   GFR calc non Af Amer 90 (*) >90 mL/min   GFR calc Af Amer >90  >90 mL/min   Comment: (NOTE)     The eGFR has been calculated using the CKD EPI equation.     This calculation has not been validated in all clinical situations.     eGFR's persistently <90 mL/min signify possible Chronic Kidney     Disease.  LIPASE, BLOOD     Status: None   Collection Time    04/24/13 11:38 PM      Result Value Ref Range   Lipase 18  11 - 59 U/L  URINALYSIS, ROUTINE W REFLEX MICROSCOPIC     Status: Abnormal  Collection Time    04/25/13 12:50 AM      Result Value Ref Range   Color, Urine YELLOW  YELLOW   APPearance CLEAR  CLEAR   Specific Gravity, Urine 1.015  1.005 - 1.030   pH 5.5  5.0 - 8.0   Glucose, UA NEGATIVE  NEGATIVE mg/dL   Hgb urine dipstick NEGATIVE  NEGATIVE   Bilirubin Urine NEGATIVE  NEGATIVE   Ketones, ur NEGATIVE  NEGATIVE mg/dL   Protein,  ur NEGATIVE  NEGATIVE mg/dL   Urobilinogen, UA 0.2  0.0 - 1.0 mg/dL   Nitrite NEGATIVE  NEGATIVE   Leukocytes, UA SMALL (*) NEGATIVE  URINE MICROSCOPIC-ADD ON     Status: Abnormal   Collection Time    04/25/13 12:50 AM      Result Value Ref Range   Squamous Epithelial / LPF MANY (*) RARE   WBC, UA 21-50  <3 WBC/hpf   RBC / HPF 0-2  <3 RBC/hpf   Bacteria, UA FEW (*) RARE   Ct Abdomen Pelvis W Contrast  04/25/2013   CLINICAL DATA:  Abdominal pain  EXAM: CT ABDOMEN AND PELVIS WITH CONTRAST  TECHNIQUE: Multidetector CT imaging of the abdomen and pelvis was performed using the standard protocol following bolus administration of intravenous contrast.  CONTRAST:  55m OMNIPAQUE IOHEXOL 300 MG/ML SOLN, 1034mOMNIPAQUE IOHEXOL 300 MG/ML SOLN  COMPARISON:  DG PELVIS 1-2V dated 03/06/2013; CT ANGIO ABD-PELV W/ CM dated 10/13/2012  FINDINGS: A moderate amount of free intraperitoneal gas is present in the nondependent abdomen. The exact source of the gas is not known. Bowel rupture is suspected.  Diffuse hepatic steatosis  Gallbladder, spleen, pancreas are within normal limits. Prominence of the left adrenal gland is stable. Right adrenal gland is unremarkable  Stable low-density lesions in the kidneys. Chronic changes of the kidneys.  Maximal transverse diameter of the abdominal aortic aneurysm is 4.0 cm. Previously, this was 3.8 cm.  There is a small amount of free fluid layering in the pelvis and adjacent to the tip of the liver.  Moderate hiatal hernia.  Advanced atherosclerosis of the aorta, visceral vasculature, and iliac vessels is not significantly changed.  The T11 compression fracture has worsened. Slight retropulsion of the superior endplate is present.  IMPRESSION: There is free intraperitoneal gas suggesting bowel rupture. The exact source is not clearly identified. Critical Value/emergent results were called by telephone at the time of interpretation on 04/25/2013 at 1:42 AM to Dr. DOVeryl Speak who  verbally acknowledged these results.  T11 compression deformity has progressed.  Abdominal aortic aneurysm has increased from 3.8 cm to 4.0 cm in maximal caliber. Recommend follow up by [place] 1year. This recommendation follows ACR consensus guidelines: White Paper of the ACR Incidental Findings Committee II on Vascular Findings. J Joellyn [id number]; 27:[id number]  Electronically Signed   By: ArMaryclare Bean.D.   On: 04/25/2013 01:44   Dg Chest Port 1 View  04/25/2013   CLINICAL DATA:  Abdominal pain.  EXAM: PORTABLE CHEST - 1 VIEW  COMPARISON:  DG CHEST 1V PORT dated 03/04/2013; CT CHEST W/O CM dated 01/29/2013; DG CHEST 1V dated 01/31/2013; CT ABD - PELV W/ CM dated 04/25/2013  FINDINGS: Free intra-abdominal air is demonstrated under the right hemidiaphragm, correlating with known free air seen at previous CT scan. Mild cardiac enlargement with normal pulmonary vascularity. Shallow inspiration. No focal airspace disease or consolidation. No pneumothorax.  IMPRESSION: Cardiac enlargement. No evidence of active pulmonary disease. Known free intraperitoneal  air is demonstrated.   Electronically Signed   By: Lucienne Capers M.D.   On: 04/25/2013 02:40    Review of Systems  Constitutional: Positive for malaise/fatigue.  HENT: Negative.   Eyes: Negative.   Respiratory: Positive for shortness of breath and wheezing.   Cardiovascular: Negative.   Gastrointestinal: Positive for nausea, vomiting and abdominal pain.  Genitourinary: Negative.   Musculoskeletal: Negative.   Skin: Negative.   Neurological: Positive for weakness.  All other systems reviewed and are negative.   Blood pressure 174/84, pulse 80, temperature 97.9 F (36.6 C), temperature source Oral, resp. rate 18, weight 56.518 kg (124 lb 9.6 oz), SpO2 94.00%. Physical Exam  Constitutional: She is oriented to person, place, and time. She appears well-developed.  HENT:  Head: Normocephalic and atraumatic.  Neck: Normal range of motion. Neck supple.   Cardiovascular: Normal rate, regular rhythm and normal heart sounds.   Respiratory: Effort normal. She has wheezes. She has no rales.  GI: She exhibits distension. There is tenderness. There is rebound and guarding.  Neurological: She is alert and oriented to person, place, and time.  Skin: Skin is warm and dry.     Assessment/Plan Impression: Perforated viscus of unknown etiology, suspect gastric perforation. Hyponatremia, hypokalemia, COPD, hypertension Plan: Patient has been started on IV Zosyn. She is also been given normal saline to help correct her hyponatremia. Potassium has been given to correct her hypokalemia. She will be taken to the operating room for exploratory laparotomy. The risks and benefits of the procedure including bleeding, infection, cardiopulmonary difficulties, possible need for an staying on a ventilator, and the possibility of an ostomy were fully explained to the patient, who gave informed consent.  Jamesetta So 04/25/2013, 6:57 AM

## 2013-04-25 NOTE — Progress Notes (Signed)
Patient still is having bright red blood with clots coming through incision. Vital signs are stable. No acute distress noted. Dressing has been changed three times since 7pm. JP drain is not having much output and appears to have clots; 5cc was emptied tonight. Notified Dr. Arnoldo Morale to ask if we could take JP drain apart and strip it to get some of the clots out and he gave permission. Order was carried out and we will continue to monitor drain.

## 2013-04-25 NOTE — Progress Notes (Signed)
INITIAL NUTRITION ASSESSMENT  DOCUMENTATION CODES Per approved criteria  -Severe malnutrition in the context of chronic illness   Pt meets criteria for severe MALNUTRITION in the context of chronic illness as evidenced by severe fat and muscle depletion.  INTERVENTION: Follow for diet advancement and addition of ONS  NUTRITION DIAGNOSIS: Inadequate oral intake related to altered GI function as evidenced by NPO.   Goal: Pt will meet >90% of estimated needs  Monitor:  Diet advancement, PO intake, labs, skin assessments, weight changes, I/O's  Reason for Assessment: MST=2  68 y.o. female  Admitting Dx: <principal problem not specified>  ASSESSMENT: Pt admitted with perforated viscus. She is scheduled for ex lap today. She is currently NPO for procedure.  Hx obtained by pt and pt son at bedside. Son reports good appetite PTA and at baseline, however, intake has been very poor in the fast 3 days. He reports pt has been vomiting up all foods and liquids. He reports she was drinking Boost daily at home, however, this was discontinued about 3 days ago, due to pt vomiting all foods and liquids.  Both son and pt reveal progressive wt loss in the past year. Wt hx reveals a 11% wt loss x 1 year, 6% wt loss x 6 months, and 1 0.8% wt loss x 1 month, however, none are clinically significant. Pt refused to lay on her side or sit up, so unable to do complete physical exam.  Nutrition Focused Physical Exam:  Subcutaneous Fat:  Orbital Region: severe depletion Upper Arm Region: severe depletion Thoracic and Lumbar Region: did not assess (pt refused)  Muscle:  Temple Region: moderate depletion Clavicle Bone Region: moderate depletion Clavicle and Acromion Bone Region: moderate depletion Scapular Bone Region: did not assess (pt refused) Dorsal Hand: severe depletion Patellar Region: severe depletion Anterior Thigh Region: moderate depletion Posterior Calf Region: moderate depletion  Edema:  none present   Height: Ht Readings from Last 1 Encounters:  04/25/13 5\' 5"  (1.651 m)    Weight: Wt Readings from Last 1 Encounters:  04/25/13 124 lb 9.6 oz (56.518 kg)    Ideal Body Weight: 125#  % Ideal Body Weight: 99%  Wt Readings from Last 10 Encounters:  04/25/13 124 lb 9.6 oz (56.518 kg)  04/25/13 124 lb 9.6 oz (56.518 kg)  03/15/13 125 lb (56.7 kg)  11/14/12 132 lb 8 oz (60.102 kg)  02/15/12 140 lb (63.504 kg)  01/29/12 140 lb 1 oz (63.532 kg)    Usual Body Weight: 140#  % Usual Body Weight: 89%  BMI:  Body mass index is 20.73 kg/(m^2). Meets criteria for normal weight.   Estimated Nutritional Needs: Kcal: 5732-2025 daily Protein: 57-71 grams daily Fluid: 1.8-2.0 L daily  Skin: Intact  Diet Order: NPO  EDUCATION NEEDS: -No education needs identified at this time   Intake/Output Summary (Last 24 hours) at 04/25/13 1016 Last data filed at 04/25/13 0945  Gross per 24 hour  Intake    606 ml  Output    300 ml  Net    306 ml    Last BM: 04/24/13  Labs:   Recent Labs Lab 04/24/13 2234 04/25/13 0753  NA 120* 123*  K 3.1* 3.9  CL 81* 89*  CO2 25 25  BUN 16 11  CREATININE 0.65 0.56  CALCIUM 8.6 8.0*  GLUCOSE 96 97    CBG (last 3)  No results found for this basename: GLUCAP,  in the last 72 hours  Scheduled Meds: . [MAR HOLD] sodium  chloride   Intravenous STAT  . [facility] HOLD] albuterol  2.5 mg Nebulization Q4H  . [MAR HOLD] amLODipine  5 mg Oral q morning - 10a  . [facility] HOLD] doxazosin  4 mg Oral q morning - 10a  . [MAR HOLD] enoxaparin (LOVENOX) injection  40 mg Subcutaneous Q24H  . [facility] HOLD] losartan  100 mg Oral q morning - 10a  . [facility] HOLD] nicotine  21 mg Transdermal Daily  . [MAR HOLD] pantoprazole (PROTONIX) IV  40 mg Intravenous QHS  . [MAR HOLD] piperacillin-tazobactam (ZOSYN)  IV  3.375 g Intravenous 3 times per day    Continuous Infusions: . sodium chloride 125 mL/hr at 04/25/13 4081    Past Medical History  Diagnosis  Date  . Hypertension   . Hyperlipidemia   . ALLERGIC RHINITIS   . Bronchitis   . COPD (chronic obstructive pulmonary disease)     Past Surgical History  Procedure Laterality Date  . Cesarean section    . Carotid endarterectomy Right     Mae Cianci A. Jimmye Norman, RD, LDN Pager: [phone]

## 2013-04-25 NOTE — Anesthesia Postprocedure Evaluation (Signed)
  Anesthesia Post-op Note  Patient: Jackie Walls  Procedure(s) Performed: Procedure(s): EXPLORATORY LAPAROTOMY (N/A) GASTRORRHAPHY (N/A)  Patient Location: PACU  Anesthesia Type:General  Level of Consciousness: sedated and confused  Airway and Oxygen Therapy: Patient Spontanous Breathing and Patient connected to face mask oxygen  Post-op Pain: none  Post-op Assessment: Post-op Vital signs reviewed, Patient's Cardiovascular Status Stable, Respiratory Function Stable, Patent Airway, No signs of Nausea or vomiting and Adequate PO intake  Post-op Vital Signs: Reviewed and stable  Last Vitals:  Filed Vitals:   04/25/13 1256  BP: 172/90  Pulse:   Temp: 36.8 C  Resp: 16    Complications: No apparent anesthesia complications

## 2013-04-25 NOTE — Anesthesia Procedure Notes (Signed)
Procedure Name: Intubation Date/Time: 04/25/2013 11:41 AM Performed by: Andree Elk, Criss Pallone A Pre-anesthesia Checklist: Patient identified, Patient being monitored, Timeout performed, Emergency Drugs available and Suction available Patient Re-evaluated:Patient Re-evaluated prior to inductionOxygen Delivery Method: Circle System Utilized Preoxygenation: Pre-oxygenation with 100% oxygen Intubation Type: IV induction, Rapid sequence and Cricoid Pressure applied Laryngoscope Size: 3 and Miller Grade View: Grade I Tube type: Oral Tube size: 7.0 mm Number of attempts: 1 Airway Equipment and Method: stylet Placement Confirmation: ETT inserted through vocal cords under direct vision,  positive ETCO2 and breath sounds checked- equal and bilateral Secured at: 21 cm Tube secured with: Tape Dental Injury: Teeth and Oropharynx as per pre-operative assessment

## 2013-04-25 NOTE — Progress Notes (Signed)
Patient real sleepy at this time, unable to do incentive, will try again later

## 2013-04-25 NOTE — ED Notes (Signed)
Family went home

## 2013-04-25 NOTE — OR Nursing (Signed)
One  Ring given to Jackie Walls patients nurse upstairs,  She was going to get security to lock it up.  Unable to remove rings to right hand and left hand.  Gauze and paper tape applied.  She verbalized  Understanding of risk of burn where the jewelry is.

## 2013-04-25 NOTE — Care Management Utilization Note (Signed)
UR completed 

## 2013-04-25 NOTE — Progress Notes (Addendum)
Pt transferred to floor from PACU. Pt is complaining of pain, VS are stable (although pt is a bit hypertensive), and she is in NAD. Dressing has drainage, will mark old drainage and reinforce. Will continue to monitor.

## 2013-04-25 NOTE — Progress Notes (Signed)
Received pt's rings from OR nurse. Brought ring to security. Will continue to monitor.

## 2013-04-25 NOTE — Op Note (Signed)
Patient:  Jackie Walls  DOB:  [birth year]  MRN:  [id number]   Preop Diagnosis:  Perforated viscus  Postop Diagnosis:  Perforated gastric ulcer  Procedure:  Exploratory laparotomy, gastrorrhaphy  Surgeon:  Aviva Signs, M.D.  Anes:  General endotracheal  Indications:  Patient is a 68 year old white female with multiple medical problems who presented emergency room with worsening upper abdominal pain. She stated it started acutely yesterday. She was seen by her primary care physician and noted to have hyponatremia. A CT scan of the abdomen was performed which revealed pneumoperitoneum, the source of which was unknown. The patient now comes the operating room for exploratory laparotomy. The risks and benefits of the procedure including bleeding, infection, cardiopulmonary difficulties, possibly of needing to be intubated postoperatively, and the possibility of needing an ostomy were fully explained to the patient, who gave informed consent.  Procedure note:  The patient is placed the supine position. After induction of general endotracheal anesthesia, the abdomen was prepped and draped using usual sterile technique with DuraPrep. Surgical site confirmation was performed.  A midline incision was made above the umbilicus. The peritoneal cavity was entered into without difficulty. Somewhat cloudy fluid was noted within the epigastric region. In the prepyloric region, a portion of the transverse colon had sealed over and anterior gastric perforation which measured 1 cm in its greatest diameter. Using omentum that was along the lesser curvature of the stomach, a Graham plication was performed using 3-0 silk sutures. The omentum was secured to the stomach using 3-0 silk sutures. An NG tube is noted be appropriate position in the stomach. The abdomen was then copiously irrigated normal saline. A #10 flat Jackson-Pratt drain was placed over the plication and brought out through separate stab wound  inferior to the incision line. It was secured at the skin level using 3-0 nylon interrupted suture. The fascia was reapproximated using an 0 PDS running suture. Subcutaneous layer was irrigated normal saline and the skin was closed in staples. Betadine ointment and dressed a dressings were applied.  All tape and needle counts were correct at the end of the procedure. Patient was extubated in the operating room and transferred to PACU in stable condition.  Complications:  None  EBL:  *Minimal**  Specimen:  *None**  Drains:  JP to epigastrium

## 2013-04-25 NOTE — Progress Notes (Signed)
Dressing saturated with bloody drainage, slight trickle from side of dressing.Dr Arnoldo Morale notified, in to check dressing. Dressing reinforced with sterile abd pads, 4x4s with pressure taped. No further sign of bleeding.

## 2013-04-26 LAB — COMPREHENSIVE METABOLIC PANEL
ALT: 20 U/L (ref 0–35)
AST: 19 U/L (ref 0–37)
Albumin: 1.8 g/dL — ABNORMAL LOW (ref 3.5–5.2)
Alkaline Phosphatase: 86 U/L (ref 39–117)
BUN: 17 mg/dL (ref 6–23)
CALCIUM: 7.6 mg/dL — AB (ref 8.4–10.5)
CO2: 22 meq/L (ref 19–32)
CREATININE: 0.65 mg/dL (ref 0.50–1.10)
Chloride: 95 mEq/L — ABNORMAL LOW (ref 96–112)
GFR, EST NON AFRICAN AMERICAN: 90 mL/min — AB (ref >90)
GLUCOSE: 75 mg/dL (ref 70–99)
Potassium: 3.9 mEq/L (ref 3.7–5.3)
Sodium: 130 mEq/L — ABNORMAL LOW (ref 137–147)
TOTAL PROTEIN: 4.6 g/dL — AB (ref 6.0–8.3)
Total Bilirubin: 0.3 mg/dL (ref 0.3–1.2)

## 2013-04-26 LAB — CBC
HEMATOCRIT: 21.9 % — AB (ref 36.0–46.0)
HEMOGLOBIN: 7.6 g/dL — AB (ref 12.0–15.0)
MCH: 31.9 pg (ref 26.0–34.0)
MCHC: 34.7 g/dL (ref 30.0–36.0)
MCV: 92 fL (ref 78.0–100.0)
Platelets: 386 10*3/uL (ref 150–400)
RBC: 2.38 MIL/uL — AB (ref 3.87–5.11)
RDW: 14.3 % (ref 11.5–15.5)
WBC: 12.3 10*3/uL — ABNORMAL HIGH (ref 4.0–10.5)

## 2013-04-26 LAB — PREPARE RBC (CROSSMATCH)

## 2013-04-26 LAB — PHOSPHORUS: Phosphorus: 3.4 mg/dL (ref 2.3–4.6)

## 2013-04-26 LAB — ABO/RH: ABO/RH(D): A POS

## 2013-04-26 LAB — MAGNESIUM: Magnesium: 1.5 mg/dL (ref 1.5–2.5)

## 2013-04-26 NOTE — Progress Notes (Signed)
Removed pt's foley. Pt tolerated procedure well. DTV by 2040 this evening. Will continue to monitor.

## 2013-04-26 NOTE — Progress Notes (Signed)
1 Day Post-Op  Subjective: Complaints of incisional pain. Minimal shortness of breath.  Objective: Vital signs in last 24 hours: Temp:  [97.4 F (36.3 C)-98.8 F (37.1 C)] 98.2 F (36.8 C) (04/23 0553) Pulse Rate:  [67-123] 91 (04/23 0553) Resp:  [12-23] 16 (04/23 0553) BP: (93-198)/(47-90) 141/60 mmHg (04/23 0553) SpO2:  [96 %-100 %] 96 % (04/23 0736) Last BM Date: 04/24/13  Intake/Output from previous day: 04/22 0701 - 04/23 0700 In: 2863.3 [I.V.:2713.3; IV Piggyback:150] Out: 1200 [Urine:550; Emesis/NG output:600; Drains:25; Blood:25] Intake/Output this shift:    General appearance: alert, cooperative and fatigued Resp: Occasional expiratory wheezing noted. Equal breath sounds bilaterally. Cardio: regular rate and rhythm, S1, S2 normal, no murmur, click, rub or gallop GI: Soft. With skin bleeding along the inferior aspect of the incision. Skin staples we applied to help quell the bleeding. JP drainage serosanguineous in nature. Abdomen soft and flat.  Lab Results:   Recent Labs  04/24/13 2234 04/26/13 0508  WBC 11.8* 12.3*  HGB 11.3* 7.6*  HCT 32.1* 21.9*  PLT 399 386   BMET  Recent Labs  04/25/13 0753 04/26/13 0508  NA 123* 130*  K 3.9 3.9  CL 89* 95*  CO2 25 22  GLUCOSE 97 75  BUN 11 17  CREATININE 0.56 0.65  CALCIUM 8.0* 7.6*   PT/INR No results found for this basename: LABPROT, INR,  in the last 72 hours  Studies/Results: Ct Abdomen Pelvis W Contrast  04/25/2013   CLINICAL DATA:  Abdominal pain  EXAM: CT ABDOMEN AND PELVIS WITH CONTRAST  TECHNIQUE: Multidetector CT imaging of the abdomen and pelvis was performed using the standard protocol following bolus administration of intravenous contrast.  CONTRAST:  69mL OMNIPAQUE IOHEXOL 300 MG/ML SOLN, 14mL OMNIPAQUE IOHEXOL 300 MG/ML SOLN  COMPARISON:  DG PELVIS 1-2V dated 03/06/2013; CT ANGIO ABD-PELV W/ CM dated 10/13/2012  FINDINGS: A moderate amount of free intraperitoneal gas is present in the  nondependent abdomen. The exact source of the gas is not known. Bowel rupture is suspected.  Diffuse hepatic steatosis  Gallbladder, spleen, pancreas are within normal limits. Prominence of the left adrenal gland is stable. Right adrenal gland is unremarkable  Stable low-density lesions in the kidneys. Chronic changes of the kidneys.  Maximal transverse diameter of the abdominal aortic aneurysm is 4.0 cm. Previously, this was 3.8 cm.  There is a small amount of free fluid layering in the pelvis and adjacent to the tip of the liver.  Moderate hiatal hernia.  Advanced atherosclerosis of the aorta, visceral vasculature, and iliac vessels is not significantly changed.  The T11 compression fracture has worsened. Slight retropulsion of the superior endplate is present.  IMPRESSION: There is free intraperitoneal gas suggesting bowel rupture. The exact source is not clearly identified. Critical Value/emergent results were called by telephone at the time of interpretation on 04/25/2013 at 1:42 AM to Dr. Veryl Speak , who verbally acknowledged these results.  T11 compression deformity has progressed.  Abdominal aortic aneurysm has increased from 3.8 cm to 4.0 cm in maximal caliber. Recommend follow up by [place] in 1year. This recommendation follows ACR consensus guidelines: White Paper of the ACR Incidental Findings Committee II on Vascular Findings. Joellyn Rued [id number]; 93:[id number].   Electronically Signed   By: Maryclare Bean M.D.   On: 04/25/2013 01:44   Dg Chest Port 1 View  04/25/2013   CLINICAL DATA:  Abdominal pain.  EXAM: PORTABLE CHEST - 1 VIEW  COMPARISON:  DG CHEST 1V PORT dated 03/04/2013;  CT CHEST W/O CM dated 01/29/2013; DG CHEST 1V dated 01/31/2013; CT ABD - PELV W/ CM dated 04/25/2013  FINDINGS: Free intra-abdominal air is demonstrated under the right hemidiaphragm, correlating with known free air seen at previous CT scan. Mild cardiac enlargement with normal pulmonary vascularity. Shallow inspiration. No focal airspace  disease or consolidation. No pneumothorax.  IMPRESSION: Cardiac enlargement. No evidence of active pulmonary disease. Known free intraperitoneal air is demonstrated.   Electronically Signed   By: Lucienne Capers M.D.   On: 04/25/2013 02:40    Anti-infectives: Anti-infectives   Start     Dose/Rate Route Frequency Ordered Stop   04/25/13 0700  piperacillin-tazobactam (ZOSYN) IVPB 3.375 g     3.375 g 12.5 mL/hr over 240 Minutes Intravenous 3 times per day 04/25/13 0654     04/25/13 0200  piperacillin-tazobactam (ZOSYN) IVPB 3.375 g     3.375 g 100 mL/hr over 30 Minutes Intravenous  Once 04/25/13 0148 04/25/13 0235      Assessment/Plan: s/p Procedure(s): EXPLORATORY LAPAROTOMY GASTRORRHAPHY Impression: Stable on postoperative day one. Anemia secondary to acute surgical blood loss and rehydration. Will give 2 units packed red blood cells. Skin is very fragile with this patient. Continue NG tube decompression.  LOS: 2 days    Jamesetta So 04/26/2013

## 2013-04-26 NOTE — Progress Notes (Signed)
Pt still having constant bleeding around incision. Will wait for Hgb check in AM per MD. Will continue to monitor.

## 2013-04-26 NOTE — Progress Notes (Addendum)
Pt has saturated dressings x 5 today. MD is aware that pt is bleeding. VS are stable, pt is alert and oriented x 4 and pt received 2 units of PRBCs. JP drain is still not draining and MD is aware. Will continue to monitor and reinforce dressings.

## 2013-04-26 NOTE — Anesthesia Postprocedure Evaluation (Signed)
  Anesthesia Post-op Note  Patient: Jackie Walls  Procedure(s) Performed: Procedure(s): EXPLORATORY LAPAROTOMY (N/A) GASTRORRHAPHY (N/A)  Patient Location: room 310  Anesthesia Type:General  Level of Consciousness: awake and patient cooperative  Airway and Oxygen Therapy: Patient Spontanous Breathing and Patient connected to nasal cannula oxygen  Post-op Pain: 5 /10, moderate  Post-op Assessment: Post-op Vital signs reviewed, Patient's Cardiovascular Status Stable, Respiratory Function Stable, Patent Airway, Pain level controlled and No headache  Post-op Vital Signs: Reviewed and stable  Last Vitals:  Filed Vitals:   04/26/13 1100  BP: 146/50  Pulse: 96  Temp: 36.7 C  Resp: 18    Complications: No apparent anesthesia complications

## 2013-04-26 NOTE — Care Management Note (Addendum)
    Page 1 of 1   05/01/2013     2:35:54 PM CARE MANAGEMENT NOTE 05/01/2013  Patient:  ISABELA, NARDELLI   Account Number:  [id number]  Date Initiated:  04/26/2013  Documentation initiated by:  Theophilus Kinds  Subjective/Objective Assessment:   Pt admitted from home with perforated viscus and s/p surgery. Pt lives with her husband and 2 sons. Pt wants to return home at discharge but sisters that was in the room is thinking pt is not able to return home. Pt has [facility] RN that was     Action/Plan:   arranged by [facility] in early April at discharge from their facility. Pt has a walker, BSC, and shower chair for the home. Will need PT consult once medically stable.   Anticipated DC Date:  04/30/2013   Anticipated DC Plan:  [place] referral  Clinical Social Worker      DC Planning Services  CM consult      Choice offered to / List presented to:             Status of service:  Completed, signed off Medicare Important Message given?  YES (If response is "NO", the following Medicare IM given date fields will be blank) Date Medicare IM given:  05/01/2013 Date Additional Medicare IM given:    Discharge Disposition:  [place]  Per UR Regulation:    If discussed at Long Length of Stay Meetings, dates discussed:   05/01/2013    Comments:  05/01/13 Claretha Cooper RN BSN CM Pt lives at home but may be placed for ST rehab. Active with [facility] and has chronic O2 from [place].  04/26/13 [place], RN BSN CM

## 2013-04-26 NOTE — Addendum Note (Signed)
Addendum created 04/26/13 1155 by Charmaine Downs, CRNA   Modules edited: Notes Section   Notes Section:  File: [id number]

## 2013-04-27 ENCOUNTER — Encounter (HOSPITAL_COMMUNITY): Payer: Self-pay | Admitting: General Surgery

## 2013-04-27 LAB — CBC
HEMATOCRIT: 24.3 % — AB (ref 36.0–46.0)
Hemoglobin: 8 g/dL — ABNORMAL LOW (ref 12.0–15.0)
MCH: 30 pg (ref 26.0–34.0)
MCHC: 32.9 g/dL (ref 30.0–36.0)
MCV: 91 fL (ref 78.0–100.0)
Platelets: 393 10*3/uL (ref 150–400)
RBC: 2.67 MIL/uL — ABNORMAL LOW (ref 3.87–5.11)
RDW: 16.4 % — AB (ref 11.5–15.5)
WBC: 14.6 10*3/uL — ABNORMAL HIGH (ref 4.0–10.5)

## 2013-04-27 LAB — MRSA PCR SCREENING: MRSA by PCR: POSITIVE — AB

## 2013-04-27 LAB — PREPARE RBC (CROSSMATCH)

## 2013-04-27 LAB — BASIC METABOLIC PANEL
BUN: 19 mg/dL (ref 6–23)
CALCIUM: 8.2 mg/dL — AB (ref 8.4–10.5)
CO2: 22 mEq/L (ref 19–32)
CREATININE: 0.58 mg/dL (ref 0.50–1.10)
Chloride: 98 mEq/L (ref 96–112)
GLUCOSE: 87 mg/dL (ref 70–99)
POTASSIUM: 3.7 meq/L (ref 3.7–5.3)
Sodium: 137 mEq/L (ref 137–147)

## 2013-04-27 LAB — H. PYLORI ANTIBODY, IGG: H PYLORI IGG: 0.74 {ISR}

## 2013-04-27 MED ORDER — SILVER NITRATE-POT NITRATE 75-25 % EX MISC
1.0000 "application " | CUTANEOUS | Status: DC | PRN
  Filled 2013-04-27: qty 1

## 2013-04-27 MED ORDER — BIOTENE DRY MOUTH MT LIQD
15.0000 mL | Freq: Two times a day (BID) | OROMUCOSAL | Status: DC
  Administered 2013-04-27 – 2013-04-28 (×3): 15 mL via OROMUCOSAL

## 2013-04-27 MED ORDER — CHLORHEXIDINE GLUCONATE 0.12 % MT SOLN
15.0000 mL | Freq: Two times a day (BID) | OROMUCOSAL | Status: DC
  Administered 2013-04-27 – 2013-04-28 (×3): 15 mL via OROMUCOSAL
  Filled 2013-04-27 (×3): qty 15

## 2013-04-27 MED ORDER — MUPIROCIN 2 % EX OINT
1.0000 "application " | TOPICAL_OINTMENT | Freq: Two times a day (BID) | CUTANEOUS | Status: AC
  Administered 2013-04-27 – 2013-05-01 (×10): 1 via NASAL
  Filled 2013-04-27 (×2): qty 22

## 2013-04-27 MED ORDER — CHLORHEXIDINE GLUCONATE CLOTH 2 % EX PADS
6.0000 | MEDICATED_PAD | Freq: Every day | CUTANEOUS | Status: AC
  Administered 2013-04-27 – 2013-05-01 (×4): 6 via TOPICAL

## 2013-04-27 NOTE — Progress Notes (Signed)
UR chart review completed.  

## 2013-04-27 NOTE — Progress Notes (Signed)
2 Days Post-Op  Subjective: No incisional complaints.  Objective: Vital signs in last 24 hours: Temp:  [97.5 F (36.4 C)-99.1 F (37.3 C)] 97.9 F (36.6 C) (04/24 1100) Pulse Rate:  [89-123] 109 (04/24 1030) Resp:  [14-27] 15 (04/24 1100) BP: (106-161)/(37-130) 144/53 mmHg (04/24 1100) SpO2:  [91 %-100 %] 100 % (04/24 1109) Weight:  [58 kg (127 lb 13.9 oz)] 58 kg (127 lb 13.9 oz) (04/24 0140) Last BM Date: 04/24/13  Intake/Output from previous day: 04/23 0701 - 04/24 0700 In: 1510.8 [P.O.:60; I.V.:1288.3; Blood:12.5; IV Piggyback:150] Out: 5784 [Urine:650; Emesis/NG output:1000] Intake/Output this shift: Total I/O In: 770 [I.V.:450; Blood:320] Out: 300 [Urine:300]  General appearance: alert, cooperative and appears stated age Resp: Bilateral expiratory wheezing noted. Cardio: regular rate and rhythm, S1, S2 normal, no murmur, click, rub or gallop GI: Soft, flat. Still with bloody ooze from incision line. Silver nitrate sticks were used as staples at yesterday were not helpful. Pressure dressing applied. JP drainage serosanguineous and minimal.  Lab Results:   Recent Labs  04/26/13 0508 04/27/13 0429  WBC 12.3* 14.6*  HGB 7.6* 8.0*  HCT 21.9* 24.3*  PLT 386 393   BMET  Recent Labs  04/26/13 0508 04/27/13 0429  NA 130* 137  K 3.9 3.7  CL 95* 98  CO2 22 22  GLUCOSE 75 87  BUN 17 19  CREATININE 0.65 0.58  CALCIUM 7.6* 8.2*   PT/INR No results found for this basename: LABPROT, INR,  in the last 72 hours  Studies/Results: No results found.  Anti-infectives: Anti-infectives   Start     Dose/Rate Route Frequency Ordered Stop   04/25/13 0700  piperacillin-tazobactam (ZOSYN) IVPB 3.375 g     3.375 g 12.5 mL/hr over 240 Minutes Intravenous 3 times per day 04/25/13 0654     04/25/13 0200  piperacillin-tazobactam (ZOSYN) IVPB 3.375 g     3.375 g 100 mL/hr over 30 Minutes Intravenous  Once 04/25/13 0148 04/25/13 0235      Assessment/Plan: s/p  Procedure(s): EXPLORATORY LAPAROTOMY GASTRORRHAPHY Impression: Skin bleeding secondary to fragile skin. Otherwise, progressing well. All awaiting healing of plication to heal. Continue NG tube decompression. Will continue to monitor skin bleeding. Would continue and step down at this point.  LOS: 3 days    Jamesetta So 04/27/2013

## 2013-04-27 NOTE — Progress Notes (Signed)
1 UNIT OF PRBC STARTED AS ORDERED.  PT'S ABDOMINAL DRSG CONTINES TO BECOME QUICKLY SATURATED W/ BLOOD. DR Arnoldo Morale TO COME SOON TO EXAMINE HER,

## 2013-04-27 NOTE — Progress Notes (Signed)
Patient has received 3 dressing changes throughout this shift. Patient's dressings were saturated each time with serosanguinous fluid. Patient is alert at this time with no complaints. Patients VS are within normal limits. Patient also has had no urine output since foley catheter was removed on previous shift. Notified MD. MD ordered an in and out cath and to transfer patient to step down at this time. Will continue to monitor patient at this time.

## 2013-04-28 ENCOUNTER — Inpatient Hospital Stay (HOSPITAL_COMMUNITY): Payer: Medicare Other

## 2013-04-28 LAB — BASIC METABOLIC PANEL
BUN: 16 mg/dL (ref 6–23)
CHLORIDE: 100 meq/L (ref 96–112)
CO2: 25 mEq/L (ref 19–32)
Calcium: 8.3 mg/dL — ABNORMAL LOW (ref 8.4–10.5)
Creatinine, Ser: 0.49 mg/dL — ABNORMAL LOW (ref 0.50–1.10)
GFR calc non Af Amer: >90 mL/min (ref >90)
Glucose, Bld: 103 mg/dL — ABNORMAL HIGH (ref 70–99)
POTASSIUM: 3.4 meq/L — AB (ref 3.7–5.3)
SODIUM: 142 meq/L (ref 137–147)

## 2013-04-28 LAB — CBC
HCT: 21.1 % — ABNORMAL LOW (ref 36.0–46.0)
Hemoglobin: 7.4 g/dL — ABNORMAL LOW (ref 12.0–15.0)
MCH: 30.7 pg (ref 26.0–34.0)
MCHC: 35.1 g/dL (ref 30.0–36.0)
MCV: 87.6 fL (ref 78.0–100.0)
PLATELETS: 312 10*3/uL (ref 150–400)
RBC: 2.41 MIL/uL — ABNORMAL LOW (ref 3.87–5.11)
RDW: 17.1 % — AB (ref 11.5–15.5)
WBC: 12.3 10*3/uL — AB (ref 4.0–10.5)

## 2013-04-28 LAB — MAGNESIUM: MAGNESIUM: 1.7 mg/dL (ref 1.5–2.5)

## 2013-04-28 LAB — PHOSPHORUS: Phosphorus: 2.2 mg/dL — ABNORMAL LOW (ref 2.3–4.6)

## 2013-04-28 LAB — PREPARE RBC (CROSSMATCH)

## 2013-04-28 MED ORDER — KCL IN DEXTROSE-NACL 20-5-0.45 MEQ/L-%-% IV SOLN
INTRAVENOUS | Status: DC
  Administered 2013-04-28 – 2013-04-29 (×3): via INTRAVENOUS

## 2013-04-28 MED ORDER — ALBUTEROL SULFATE (2.5 MG/3ML) 0.083% IN NEBU
2.5000 mg | INHALATION_SOLUTION | Freq: Four times a day (QID) | RESPIRATORY_TRACT | Status: DC
  Administered 2013-04-29 – 2013-05-02 (×14): 2.5 mg via RESPIRATORY_TRACT
  Filled 2013-04-28 (×14): qty 3

## 2013-04-28 MED ORDER — ALBUTEROL SULFATE (2.5 MG/3ML) 0.083% IN NEBU
2.5000 mg | INHALATION_SOLUTION | RESPIRATORY_TRACT | Status: DC | PRN
  Administered 2013-04-29: 2.5 mg via RESPIRATORY_TRACT
  Filled 2013-04-28: qty 3

## 2013-04-28 NOTE — Plan of Care (Signed)
Problem: Consults Goal: Skin Care Protocol Initiated - if Braden Score 18 or less If consults are not indicated, leave blank or document N/A Outcome: Progressing Midline incision at present draining, pressure applied to decrease bleeding Goal: Nutrition Consult-if indicated Outcome: Progressing Ice chips only, post surgical  Problem: Phase I Progression Outcomes Goal: Pain controlled with appropriate interventions Dilaudid for left foot pain, no complaints of abdominal pain. Goal: OOB as tolerated unless otherwise ordered Outcome: Not Progressing Bedrest at present due to active bleeding Goal: Initial discharge plan identified Home with spouse Goal: Voiding-avoid urinary catheter unless indicated Outcome: Progressing IN and OUT catheters Q6 hrs. Due to urinary retention

## 2013-04-28 NOTE — Progress Notes (Signed)
SECOND UNIT PRBC STARTED.

## 2013-04-28 NOTE — Progress Notes (Signed)
FIRST UNIT PRBC STARTED. 

## 2013-04-28 NOTE — Progress Notes (Signed)
3 Days Post-Op  Subjective: Bleeding has stopped from midline surgical incision. Patient complains of left foot pain. Apparently, she fell out of bed at [facility] last month, but no left foot x-rays were taken.  Objective: Vital signs in last 24 hours: Temp:  [97.6 F (36.4 C)-99.2 F (37.3 C)] 98 F (36.7 C) (04/25 0900) Pulse Rate:  [107-114] 109 (04/24 1030) Resp:  [13-25] 21 (04/25 0900) BP: (115-167)/(49-106) 147/56 mmHg (04/25 0900) SpO2:  [91 %-100 %] 100 % (04/25 0727) Weight:  [59.693 kg (131 lb 9.6 oz)] 59.693 kg (131 lb 9.6 oz) (04/25 0500) Last BM Date: 04/24/13  Intake/Output from previous day: 04/24 0701 - 04/25 0700 In: 1980 [P.O.:210; I.V.:1300; Blood:320; IV Piggyback:150] Out: 2870 [Urine:1250; Emesis/NG output:1600; Drains:20] Intake/Output this shift: Total I/O In: 80 [P.O.:30; I.V.:50] Out: -   General appearance: alert, cooperative and no distress Resp: clear to auscultation bilaterally Cardio: regular rate and rhythm, S1, S2 normal, no murmur, click, rub or gallop GI: Soft. Dressing dry and intact. Abdominal binder in place. JP drainage sanguinous in nature, low.  Lab Results:   Recent Labs  04/27/13 0429 04/28/13 0537  WBC 14.6* 12.3*  HGB 8.0* 7.4*  HCT 24.3* 21.1*  PLT 393 312   BMET  Recent Labs  04/27/13 0429 04/28/13 0537  NA 137 142  K 3.7 3.4*  CL 98 100  CO2 22 25  GLUCOSE 87 103*  BUN 19 16  CREATININE 0.58 0.49*  CALCIUM 8.2* 8.3*   PT/INR No results found for this basename: LABPROT, INR,  in the last 72 hours  Studies/Results: No results found.  Anti-infectives: Anti-infectives   Start     Dose/Rate Route Frequency Ordered Stop   04/25/13 0700  piperacillin-tazobactam (ZOSYN) IVPB 3.375 g     3.375 g 12.5 mL/hr over 240 Minutes Intravenous 3 times per day 04/25/13 0654     04/25/13 0200  piperacillin-tazobactam (ZOSYN) IVPB 3.375 g     3.375 g 100 mL/hr over 30 Minutes Intravenous  Once 04/25/13  0148 04/25/13 0235      Assessment/Plan: s/p Procedure(s): EXPLORATORY LAPAROTOMY GASTRORRHAPHY Impression: Still with anemia, acute on probable chronic. Will give 2 units packed red blood cells. Vital signs are stable. Will get left foot films. Will give patient up in chair. May remove NG tube in next 24-48 hours. Will adjust IV fluids.  LOS: 4 days    Jamesetta So 04/28/2013

## 2013-04-28 NOTE — Progress Notes (Signed)
NURSING STAFF ATTEMPTED SEVERAL TIMES TO INSERT FOLEY CATHETER W/O ANY URINE RETURN. FINALLY BLADDER SCAN PREFORMED  WHICH SHOWED >600CC URINE IN BLADDER.  PT STATED THAT AT HOME SHE HAS TO SIT SEVERAL MINUTES ON COMMODE BEFORE SHE CAN VOID. PT UP TO BSC W/ MOD ASSIT. AFTER 5MIN PT VOIDED 350CC CLEAR GOLDEN URINE. ASSISTED BACK T0 BED.TOLERATED WELL.

## 2013-04-29 LAB — TYPE AND SCREEN
ABO/RH(D): A POS
Antibody Screen: NEGATIVE
UNIT DIVISION: 0
UNIT DIVISION: 0
UNIT DIVISION: 0
Unit division: 0
Unit division: 0

## 2013-04-29 LAB — CBC
HCT: 27.5 % — ABNORMAL LOW (ref 36.0–46.0)
Hemoglobin: 9.8 g/dL — ABNORMAL LOW (ref 12.0–15.0)
MCH: 31.1 pg (ref 26.0–34.0)
MCHC: 35.6 g/dL (ref 30.0–36.0)
MCV: 87.3 fL (ref 78.0–100.0)
PLATELETS: 326 10*3/uL (ref 150–400)
RBC: 3.15 MIL/uL — AB (ref 3.87–5.11)
RDW: 15.9 % — AB (ref 11.5–15.5)
WBC: 13.1 10*3/uL — AB (ref 4.0–10.5)

## 2013-04-29 LAB — BASIC METABOLIC PANEL
BUN: 11 mg/dL (ref 6–23)
CALCIUM: 8.5 mg/dL (ref 8.4–10.5)
CO2: 31 mEq/L (ref 19–32)
Chloride: 102 mEq/L (ref 96–112)
Creatinine, Ser: 0.43 mg/dL — ABNORMAL LOW (ref 0.50–1.10)
GFR calc Af Amer: >90 mL/min (ref >90)
Glucose, Bld: 136 mg/dL — ABNORMAL HIGH (ref 70–99)
Potassium: 3 mEq/L — ABNORMAL LOW (ref 3.7–5.3)
Sodium: 144 mEq/L (ref 137–147)

## 2013-04-29 MED ORDER — BIOTENE DRY MOUTH MT LIQD
15.0000 mL | Freq: Two times a day (BID) | OROMUCOSAL | Status: DC
  Administered 2013-04-30 – 2013-05-02 (×5): 15 mL via OROMUCOSAL

## 2013-04-29 MED ORDER — POTASSIUM CHLORIDE 10 MEQ/100ML IV SOLN
10.0000 meq | INTRAVENOUS | Status: AC
  Administered 2013-04-29 (×4): 10 meq via INTRAVENOUS
  Filled 2013-04-29: qty 100

## 2013-04-29 NOTE — Progress Notes (Signed)
4 Days Post-Op  Subjective: Had multiple large bowel movements yesterday. No further bleeding from incision.  Objective: Vital signs in last 24 hours: Temp:  [97.4 F (36.3 C)-98.8 F (37.1 C)] 97.4 F (36.3 C) (04/26 0400) Pulse Rate:  [94-112] 94 (04/26 0000) Resp:  [13-26] 19 (04/26 0600) BP: (93-197)/(49-124) 182/63 mmHg (04/26 0600) SpO2:  [79 %-100 %] 98 % (04/26 0003) Weight:  [56.6 kg (124 lb 12.5 oz)] 56.6 kg (124 lb 12.5 oz) (04/26 0500) Last BM Date: 04/24/13  Intake/Output from previous day: 04/25 0701 - 04/26 0700 In: 960 [P.O.:60; I.V.:800; IV Piggyback:100] Out: 1360 [Urine:750; Emesis/NG output:600; Drains:10] Intake/Output this shift:    General appearance: alert, cooperative and no distress Resp: Minimal expiratory wheezing noted. Equal breath sounds bilaterally. Cardio: regular rate and rhythm, S1, S2 normal, no murmur, click, rub or gallop GI: Soft, incision healing well. JP drainage serosanguineous in nature and low. Active bowel sounds appreciated.  Lab Results:   Recent Labs  04/28/13 0537 04/29/13 0444  WBC 12.3* 13.1*  HGB 7.4* 9.8*  HCT 21.1* 27.5*  PLT 312 326   BMET  Recent Labs  04/28/13 0537 04/29/13 0444  NA 142 144  K 3.4* 3.0*  CL 100 102  CO2 25 31  GLUCOSE 103* 136*  BUN 16 11  CREATININE 0.49* 0.43*  CALCIUM 8.3* 8.5   PT/INR No results found for this basename: LABPROT, INR,  in the last 72 hours  Studies/Results: Dg Foot Complete Left  04/28/2013   CLINICAL DATA:  Left foot pain.  EXAM: LEFT FOOT - COMPLETE 3+ VIEW  COMPARISON:  None.  FINDINGS: The bones are osteopenic. There is no evidence of acute fracture or dislocation. Joint spaces are preserved. An oblong, peripherally sclerotic focus in the distal phalanx of the great toe measures approximately 1.2 cm with sharply defined margins and a narrow zone of transition. A small linear focus of soft tissue calcification is noted within the plantar aspect of the midfoot.   IMPRESSION: 1. No evidence of acute osseous abnormality. 2. Osteopenia. 3. Sclerotic lesion in the distal phalanx of the great toe with benign appearance.   Electronically Signed   By: Logan Bores   On: 04/28/2013 11:52    Anti-infectives: Anti-infectives   Start     Dose/Rate Route Frequency Ordered Stop   04/25/13 0700  piperacillin-tazobactam (ZOSYN) IVPB 3.375 g     3.375 g 12.5 mL/hr over 240 Minutes Intravenous 3 times per day 04/25/13 0654     04/25/13 0200  piperacillin-tazobactam (ZOSYN) IVPB 3.375 g     3.375 g 100 mL/hr over 30 Minutes Intravenous  Once 04/25/13 0148 04/25/13 0235      Assessment/Plan: s/p Procedure(s): EXPLORATORY LAPAROTOMY GASTRORRHAPHY Impression: Bowel function has returned. NG tube output decreased. Will remove NG tube and start clear liquid diet. Will restart patient's by mouth blood pressure pills. Hemoglobin stable. We'll continue to watch her in step down for next 24-48 hours.  LOS: 5 days    Jamesetta So 04/29/2013

## 2013-04-29 NOTE — Progress Notes (Signed)
Pt had very large liquid black stool

## 2013-04-30 LAB — CBC
HEMATOCRIT: 28.8 % — AB (ref 36.0–46.0)
Hemoglobin: 9.9 g/dL — ABNORMAL LOW (ref 12.0–15.0)
MCH: 30.6 pg (ref 26.0–34.0)
MCHC: 34.4 g/dL (ref 30.0–36.0)
MCV: 88.9 fL (ref 78.0–100.0)
Platelets: 313 10*3/uL (ref 150–400)
RBC: 3.24 MIL/uL — ABNORMAL LOW (ref 3.87–5.11)
RDW: 16 % — AB (ref 11.5–15.5)
WBC: 10.6 10*3/uL — ABNORMAL HIGH (ref 4.0–10.5)

## 2013-04-30 LAB — BASIC METABOLIC PANEL
BUN: 7 mg/dL (ref 6–23)
CO2: 31 mEq/L (ref 19–32)
CREATININE: 0.52 mg/dL (ref 0.50–1.10)
Calcium: 8.5 mg/dL (ref 8.4–10.5)
Chloride: 101 mEq/L (ref 96–112)
GFR calc non Af Amer: >90 mL/min (ref >90)
Glucose, Bld: 99 mg/dL (ref 70–99)
Potassium: 3 mEq/L — ABNORMAL LOW (ref 3.7–5.3)
Sodium: 142 mEq/L (ref 137–147)

## 2013-04-30 MED ORDER — PANTOPRAZOLE SODIUM 40 MG PO TBEC
40.0000 mg | DELAYED_RELEASE_TABLET | Freq: Every day | ORAL | Status: DC
  Administered 2013-04-30 – 2013-05-02 (×3): 40 mg via ORAL
  Filled 2013-04-30 (×3): qty 1

## 2013-04-30 MED ORDER — PIPERACILLIN-TAZOBACTAM 3.375 G IVPB
INTRAVENOUS | Status: AC
  Filled 2013-04-30: qty 100

## 2013-04-30 MED ORDER — BOOST / RESOURCE BREEZE PO LIQD
1.0000 | Freq: Three times a day (TID) | ORAL | Status: DC
  Administered 2013-04-30: 1 via ORAL

## 2013-04-30 MED ORDER — POTASSIUM CHLORIDE 10 MEQ/100ML IV SOLN
10.0000 meq | INTRAVENOUS | Status: AC
  Administered 2013-04-30 (×4): 10 meq via INTRAVENOUS
  Filled 2013-04-30 (×4): qty 100

## 2013-04-30 NOTE — Progress Notes (Signed)
NUTRITION FOLLOW UP  Intervention:   Resource Breeze po TID, each supplement provides 250 kcal and 9 grams of protein  Nutrition Dx:   Inadequate oral intake related to altered GI function as evidenced by NPO; ongoing  Goal:   Pt will meet >90% of estimated needs; goal not met  Monitor:   Diet advancement, PO intake, labs, skin assessments, weight changes, I/O's  Assessment:   Pt s/p exp lap for perforated gastric ulcer. Noted ng tube was removed on 04/29/13 and diet was advanced to clear liquids. Pt is tolerating well; PO: 50%.  Noted a 0.8% wt loss x 6 days, which is not clinically significant. Will add nutritional supplement due to recent PO diet advancement from NPO and diagnosis of severe malnutrition.   Height: Ht Readings from Last 1 Encounters:  04/27/13 5' 5"  (1.651 m)    Weight Status:   Wt Readings from Last 1 Encounters:  04/30/13 123 lb 7.3 oz (56 kg)    Re-estimated needs:  Kcal: 1680-1960 daily Protein: 70-84 grams daily Fluid: 1.7-2.0 L daily  Skin: abdominal incision  Diet Order: Clear Liquid   Intake/Output Summary (Last 24 hours) at 04/30/13 1109 Last data filed at 04/30/13 1006  Gross per 24 hour  Intake   2800 ml  Output    355 ml  Net   2445 ml    Last BM: 04/29/13   Labs:   Recent Labs Lab 04/25/13 0753 04/26/13 0508  04/28/13 0537 04/29/13 0444 04/30/13 0507  NA 123* 130*  < > 142 144 142  K 3.9 3.9  < > 3.4* 3.0* 3.0*  CL 89* 95*  < > 100 102 101  CO2 25 22  < > 25 31 31   BUN 11 17  < > 16 11 7   CREATININE 0.56 0.65  < > 0.49* 0.43* 0.52  CALCIUM 8.0* 7.6*  < > 8.3* 8.5 8.5  MG  --  1.5  --  1.7  --   --   PHOS  --  3.4  --  2.2*  --   --   GLUCOSE 97 75  < > 103* 136* 99  < > = values in this interval not displayed.  CBG (last 3)  No results found for this basename: GLUCAP,  in the last 72 hours  Scheduled Meds: . albuterol  2.5 mg Nebulization QID  . amLODipine  5 mg Oral q morning - 10a  . antiseptic oral rinse   15 mL Mouth Rinse BID  . Chlorhexidine Gluconate Cloth  6 each Topical Q0600  . doxazosin  4 mg Oral q morning - 10a  . losartan  100 mg Oral q morning - 10a  . mupirocin ointment  1 application Nasal BID  . nicotine  21 mg Transdermal Daily  . pantoprazole (PROTONIX) IV  40 mg Intravenous QHS  . piperacillin-tazobactam (ZOSYN)  IV  3.375 g Intravenous 3 times per day    Continuous Infusions: . dextrose 5 % and 0.45 % NaCl with KCl 20 mEq/L 50 mL/hr at 04/30/13 1000    Jasmeet Gehl A. Jimmye Norman, RD, LDN Pager: [phone]

## 2013-04-30 NOTE — Progress Notes (Signed)
Called report to Anderson Malta, RN on Dept 300.  Verbalized understanding.  Pt transferred to floor in safe and stable condition.  Jackie Walls 04/30/2013

## 2013-04-30 NOTE — Progress Notes (Signed)
5 Days Post-Op  Subjective: Continues to have bowel movements. Tolerating clear liquid diet well. No shortness of breath.  Objective: Vital signs in last 24 hours: Temp:  [98.1 F (36.7 C)-99 F (37.2 C)] 98.2 F (36.8 C) (04/27 0801) Pulse Rate:  [71-92] 88 (04/27 0800) Resp:  [14-30] 18 (04/27 1000) BP: (128-189)/(59-91) 179/91 mmHg (04/27 1000) SpO2:  [91 %-100 %] 91 % (04/27 1104) Weight:  [56 kg (123 lb 7.3 oz)] 56 kg (123 lb 7.3 oz) (04/27 0411) Last BM Date: 04/29/13  Intake/Output from previous day: 04/26 0701 - 04/27 0700 In: 3690 [P.O.:2040; I.V.:1200; IV Piggyback:450] Out: 855 [Urine:800; Drains:55] Intake/Output this shift: Total I/O In: 330 [P.O.:230; I.V.:100] Out: -   General appearance: alert, cooperative and no distress Resp: clear to auscultation bilaterally Cardio: regular rate and rhythm, S1, S2 normal, no murmur, click, rub or gallop GI: Soft. Incision healing well. JP drainage serous in nature, low.  Lab Results:   Recent Labs  04/29/13 0444 04/30/13 0507  WBC 13.1* 10.6*  HGB 9.8* 9.9*  HCT 27.5* 28.8*  PLT 326 313   BMET  Recent Labs  04/29/13 0444 04/30/13 0507  NA 144 142  K 3.0* 3.0*  CL 102 101  CO2 31 31  GLUCOSE 136* 99  BUN 11 7  CREATININE 0.43* 0.52  CALCIUM 8.5 8.5   PT/INR No results found for this basename: LABPROT, INR,  in the last 72 hours  Studies/Results: No results found.  Anti-infectives: Anti-infectives   Start     Dose/Rate Route Frequency Ordered Stop   04/25/13 0700  piperacillin-tazobactam (ZOSYN) IVPB 3.375 g     3.375 g 12.5 mL/hr over 240 Minutes Intravenous 3 times per day 04/25/13 0654     04/25/13 0200  piperacillin-tazobactam (ZOSYN) IVPB 3.375 g     3.375 g 100 mL/hr over 30 Minutes Intravenous  Once 04/25/13 0148 04/25/13 0235      Assessment/Plan: s/p Procedure(s): EXPLORATORY LAPAROTOMY GASTRORRHAPHY Impression: Bowel function has returned. She is doing well on clear liquid diet.  We'll advance as tolerated. Will treat hypokalemia again. No further bleeding from skin incision. Will transfer to regular floor. Anticipate discharge in next 24-48 hours.  LOS: 6 days    Jamesetta So 04/30/2013

## 2013-05-01 LAB — BASIC METABOLIC PANEL
BUN: 4 mg/dL — ABNORMAL LOW (ref 6–23)
CO2: 29 mEq/L (ref 19–32)
Calcium: 8.4 mg/dL (ref 8.4–10.5)
Chloride: 98 mEq/L (ref 96–112)
Creatinine, Ser: 0.48 mg/dL — ABNORMAL LOW (ref 0.50–1.10)
GFR calc Af Amer: >90 mL/min (ref >90)
GFR calc non Af Amer: >90 mL/min (ref >90)
Glucose, Bld: 93 mg/dL (ref 70–99)
Potassium: 3.3 mEq/L — ABNORMAL LOW (ref 3.7–5.3)
Sodium: 136 mEq/L — ABNORMAL LOW (ref 137–147)

## 2013-05-01 MED ORDER — BOOST HIGH PROTEIN PO LIQD
237.0000 mL | Freq: Three times a day (TID) | ORAL | Status: DC
  Administered 2013-05-01 – 2013-05-02 (×2): 1 via ORAL
  Filled 2013-05-01 (×6): qty 1

## 2013-05-01 MED ORDER — POTASSIUM CHLORIDE 10 MEQ/100ML IV SOLN
10.0000 meq | INTRAVENOUS | Status: AC
  Administered 2013-05-01 (×5): 10 meq via INTRAVENOUS
  Filled 2013-05-01 (×4): qty 100

## 2013-05-01 NOTE — Clinical Social Work Psychosocial (Signed)
Clinical Social Work Department BRIEF PSYCHOSOCIAL ASSESSMENT 05/01/2013  Patient:  Jackie Walls, Jackie Walls     Account Number:  [id number]     Admit date:  04/24/2013  Clinical Social Worker:  Daiva Huge  Date/Time:  05/01/2013 03:17 PM  Referred by:  Physician  Date Referred:  05/01/2013 Referred for  SNF Placement   Other Referral:   Interview type:  Patient Other interview type:    PSYCHOSOCIAL DATA Living Status:  FAMILY Admitted from facility:   Level of care:   Primary support name:  Jackie Walls- husband Primary support relationship to patient:  FAMILY Degree of support available:   good    CURRENT CONCERNS Current Concerns  Post-Acute Placement   Other Concerns:    SOCIAL WORK ASSESSMENT / PLAN Spoke with patient and she is agreeable to SNF for rehab at d/c- she reports that she lives with her husband and 2 sons- she has been to SNF in the past and prefers the [place] area-   Assessment/plan status:  Other - See comment Other assessment/ plan:   FL2 and PASARR for SNF   Information/referral to community resources:   SNF list    PATIENT'S/FAMILY'S RESPONSE TO PLAN OF CARE: Patient agreeable to SNF search- attmepting to call her husband to discuss with him as well- will proceed with SNF search and advise-  [name], MSW, [place]

## 2013-05-01 NOTE — Clinical Social Work Psychosocial (Signed)
Clinical Social Work Department CLINICAL SOCIAL WORK PLACEMENT NOTE 05/01/2013  Patient:  Jackie Walls, Jackie Walls  Account Number:  [id number] Admit date:  04/24/2013  Clinical Social Worker:  Daiva Huge  Date/time:  05/01/2013 03:35 PM  Clinical Social Work is seeking post-discharge placement for this patient at the following level of care:   SKILLED NURSING   (*CSW will update this form in Epic as items are completed)   05/01/2013  Patient/family provided with [place] Department of Clinical Social Work's list of facilities offering this level of care within the geographic area requested by the patient (or if unable, by the patient's family).  05/01/2013  Patient/family informed of their freedom to choose among providers that offer the needed level of care, that participate in Medicare, Medicaid or managed care program needed by the patient, have an available bed and are willing to accept the patient.  05/01/2013  Patient/family informed of [facility]' ownership interest in [facility], as well as of the fact that they are under no obligation to receive care at this facility.  PASARR submitted to EDS on 05/01/2013 PASARR number received from EDS on 05/01/2013  FL2 transmitted to all facilities in geographic area requested by pt/family on  05/01/2013 FL2 transmitted to all facilities within larger geographic area on   Patient informed that his/her managed care company has contracts with or will negotiate with  certain facilities, including the following:     Patient/family informed of bed offers received:   Patient chooses bed at  Physician recommends and patient chooses bed at    Patient to be transferred to  on   Patient to be transferred to facility by   The following physician request were entered in Epic:   Additional Comments:  [name], MSW, [place]

## 2013-05-01 NOTE — Progress Notes (Signed)
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 Noted diet has been advanced to soft and RN requesting alternate supplement due to diet advancement. Will D/c Resource Breeze and order Boost Plus TID.

## 2013-05-01 NOTE — Evaluation (Signed)
Physical Therapy Evaluation Patient Details Name: Jackie Walls MRN: [id number] DOB: 1945-10-12 Today's Date: 05/01/2013   History of Present Illness  Pt is admitted with abdominal pain on 04-24-13.  She underwent exploratory laparotomy on 04-25-13 upon which a gastrorrhaphy was done.  She has a hx of COPD (O2 dependent) and was hospitalized last month at [facility] for exacerbation of COPD.  Pt lives with her husband and sons and is very sedentary by her report.  She is normally independent with ADLs using a walker but "my husband and sons take care of me and do a lot for me".  Clinical Impression   Pt was seen for evaluation.  She is very pleasant and cooperative, able to follow all directions.  Her O2 sat=95% on 2 L O2 at rest and with activity.  She is generally deconditioned due to recent surgery.  She has a midline incision with abdominal binder in place.  Her abdominal pain was well controlled with activity.  She would benefit from SNF at d/c as she is currently a high fall risk due to weakness.  Pt is agreeable.    Follow Up Recommendations SNF    Equipment Recommendations  None recommended by PT    Recommendations for Other Services   OT    Precautions / Restrictions Precautions Precautions: Fall Precaution Comments: pt is on C Diff precautions Restrictions Weight Bearing Restrictions: No      Mobility  Bed Mobility Overal bed mobility: Needs Assistance Bed Mobility: Sidelying to Sit     Supine to sit: Min assist;HOB elevated     General bed mobility comments: pt sleeps in a hospital bed with HOB elevated...she was instructed in log rolling in order to transfer with minimal stress on the abdominal incision  Transfers Overall transfer level: Needs assistance Equipment used: Rolling walker (2 wheeled) Transfers: Sit to/from Stand Sit to Stand: Min assist            Ambulation/Gait Ambulation/Gait assistance: Min assist Ambulation Distance (Feet): 15  Feet Assistive device: Rolling walker (2 wheeled) Gait Pattern/deviations: WFL(Within Functional Limits);Trunk flexed   Gait velocity interpretation: Below normal speed for age/gender    Stairs:  N/T                     Balance Overall balance assessment: Needs assistance Sitting-balance support: No upper extremity supported;Feet supported Sitting balance-Leahy Scale: Good     Standing balance support: Bilateral upper extremity supported Standing balance-Leahy Scale: [place] expects to be discharged to:: Skilled nursing facility                      Prior Function Level of Independence: Needs assistance   Gait / Transfers Assistance Needed: able to transfer and ambulate with a walker independently  ADL's / Homemaking Assistance Needed: pt reports independence with ADLs but full assist with household activities                Extremity/Trunk Assessment               Lower Extremity Assessment: Generalized weakness         Communication   Communication: HOH  Cognition Arousal/Alertness: Awake/alert Behavior During Therapy: WFL for tasks assessed/performed Overall Cognitive Status: Within Functional Limits for tasks assessed  Exercises General Exercises - Lower Extremity Ankle Circles/Pumps: AROM;Both;10 reps;Supine Heel Slides: AROM;Both;10 reps;Supine Hip ABduction/ADduction: AROM;Both;10 reps;Supine      Assessment/Plan    PT Assessment Patient needs continued PT services  PT Diagnosis Difficulty walking;Generalized weakness;Acute pain   PT Problem List Decreased strength;Decreased activity tolerance;Decreased mobility;Pain  PT Treatment Interventions Gait training;Functional mobility training;Therapeutic exercise   PT Goals (Current goals can be found in the Care Plan section) Acute Rehab PT Goals Patient Stated Goal:  none stated PT Goal Formulation: With patient Time For Goal Achievement: 05/15/13 Potential to Achieve Goals: Good    Frequency Min 3X/week   Barriers to discharge  none                     End of Session Equipment Utilized During Treatment: Gait belt Activity Tolerance: Patient tolerated treatment well Patient left: in bed;with call bell/phone within reach;with chair alarm set Nurse Communication: Mobility status         Time: 8756-4332 PT Time Calculation (min): 42 min   Charges:   PT Evaluation $Initial PT Evaluation Tier I: 1 Procedure     PT G Codes:          Sable Feil 05/01/2013, 1:51 PM

## 2013-05-01 NOTE — Progress Notes (Signed)
6 Days Post-Op  Subjective: Having diarrhea. He is tolerating full liquid diet well.  Objective: Vital signs in last 24 hours: Temp:  [97.6 F (36.4 C)-98.5 F (36.9 C)] 97.6 F (36.4 C) (04/28 0438) Pulse Rate:  [78-94] 94 (04/28 0438) Resp:  [18-19] 18 (04/28 0438) BP: (114-179)/(63-91) 114/73 mmHg (04/28 0438) SpO2:  [91 %-98 %] 98 % (04/28 0719) Last BM Date: 05/01/13  Intake/Output from previous day: 04/27 0701 - 04/28 0700 In: 1070 [P.O.:470; I.V.:400; IV Piggyback:200] Out: 105 [Drains:105] Intake/Output this shift:    General appearance: alert, cooperative, appears stated age and no distress Resp: Bilateral expiratory wheezing noted. Cardio: regular rate and rhythm, S1, S2 normal, no murmur, click, rub or gallop GI: Soft. JP drainage serosanguineous in nature. No bile noted. Incision healing well.  Lab Results:   Recent Labs  04/29/13 0444 04/30/13 0507  WBC 13.1* 10.6*  HGB 9.8* 9.9*  HCT 27.5* 28.8*  PLT 326 313   BMET  Recent Labs  04/30/13 0507 05/01/13 0620  NA 142 136*  K 3.0* 3.3*  CL 101 98  CO2 31 29  GLUCOSE 99 93  BUN 7 4*  CREATININE 0.52 0.48*  CALCIUM 8.5 8.4   PT/INR No results found for this basename: LABPROT, INR,  in the last 72 hours  Studies/Results: No results found.  Anti-infectives: Anti-infectives   Start     Dose/Rate Route Frequency Ordered Stop   04/25/13 0700  piperacillin-tazobactam (ZOSYN) IVPB 3.375 g     3.375 g 12.5 mL/hr over 240 Minutes Intravenous 3 times per day 04/25/13 0654     04/25/13 0200  piperacillin-tazobactam (ZOSYN) IVPB 3.375 g     3.375 g 100 mL/hr over 30 Minutes Intravenous  Once 04/25/13 0148 04/25/13 0235      Assessment/Plan: s/p Procedure(s): EXPLORATORY LAPAROTOMY GASTRORRHAPHY Impression: Continues to recover from [place]. We'll advance to soft diet. Will supplement hypokalemia. Anticipate discharge in next 24-48 hours.  LOS: 7 days    Jamesetta So 05/01/2013

## 2013-05-01 NOTE — Progress Notes (Signed)
Midline incision and JP site cleaned and new dressing applied. Patient tolerated well. Abdominal binder back in place. No complaints voiced at this time.

## 2013-05-02 LAB — BASIC METABOLIC PANEL
BUN: 5 mg/dL — ABNORMAL LOW (ref 6–23)
CALCIUM: 8.8 mg/dL (ref 8.4–10.5)
CO2: 29 mEq/L (ref 19–32)
CREATININE: 0.52 mg/dL (ref 0.50–1.10)
Chloride: 100 mEq/L (ref 96–112)
Glucose, Bld: 90 mg/dL (ref 70–99)
Potassium: 3.9 mEq/L (ref 3.7–5.3)
Sodium: 137 mEq/L (ref 137–147)

## 2013-05-02 MED ORDER — OMEPRAZOLE 20 MG PO CPDR: 20.0000 mg | DELAYED_RELEASE_CAPSULE | Freq: Two times a day (BID) | ORAL | Status: DC

## 2013-05-02 MED ORDER — HYDROCODONE-ACETAMINOPHEN 5-325 MG PO TABS: 1.0000 | ORAL_TABLET | ORAL | Status: DC | PRN

## 2013-05-02 NOTE — Evaluation (Signed)
Occupational Therapy Evaluation Patient Details Name: Jackie Walls MRN: [id number] DOB: [birth year] Today's Date: 05/02/2013    History of Present Illness Pt is admitted with abdominal pain on 04-24-13.  She underwent exploratory laparotomy on 04-25-13 upon which a gastrorrhaphy was done.  She has a hx of COPD (O2 dependent) and was hospitalized last month at [facility] for exacerbation of COPD.  Pt lives with her husband and sons and is very sedentary by her report.  She is normally independent with ADLs using a walker but "my husband and sons take care of me and do a lot for me".   Clinical Impression   Pt is presenting to acute OT with above situation.  She is currently presenting near baseline for ADL/IADL functioning, having received assist prior for IADL needs in the home.  Pt has WFL BUE strength.  Pt feels she is able to complete tasks currently that she was able to prior to admission.  No OT follow-up needed at this time, but agree with PT recommendation for further SNF PT services.  Acute OT to sign off.    Follow Up Recommendations  No OT follow up;SNF    Equipment Recommendations   (None needed at this time - defer to SNF for d/c home)    Recommendations for Other Services       Precautions / Restrictions Precautions Precautions: Fall Restrictions Weight Bearing Restrictions: No      Mobility Bed Mobility                  Transfers                      Balance                                            ADL Overall ADL's : At baseline                                             Vision                     Perception     Praxis      Pertinent Vitals/Pain Pt denies abdominal pain.     Hand Dominance     Extremity/Trunk Assessment Upper Extremity Assessment Upper Extremity Assessment: Overall WFL for tasks assessed;Generalized weakness (Slight weakness in right shoulder (4-/5))    Lower Extremity Assessment Lower Extremity Assessment: Defer to PT evaluation       Communication     Cognition Arousal/Alertness: Awake/alert Behavior During Therapy: WFL for tasks assessed/performed Overall Cognitive Status: Within Functional Limits for tasks assessed                     General Comments       Exercises       Shoulder Instructions      Home Living Family/patient expects to be discharged to:: Skilled nursing facility Living Arrangements: Spouse/significant other;Children                                      Prior Functioning/Environment Level of Independence: Needs assistance    ADL's /  Homemaking Assistance Needed: pt reports independence with ADLs but full assist with household activities.  She reports being able to engage in simple cooking tasks as needed.        OT Diagnosis:     OT Problem List:     OT Treatment/Interventions:      OT Goals(Current goals can be found in the care plan section) Acute Rehab OT Goals Patient Stated Goal: No OT goals needed OT Goal Formulation: With patient  OT Frequency:     Barriers to D/C:            Co-evaluation              End of Session    Activity Tolerance: Patient tolerated treatment well Patient left: in bed;with call bell/phone within reach   Time: 0851-0908 OT Time Calculation (min): 17 min Charges:  OT General Charges $OT Visit: 1 Procedure OT Evaluation $Initial OT Evaluation Tier I: 1 Procedure G-Codes:     Bea Graff, MS, OTR/L [phone]  05/02/2013, 9:11 AM

## 2013-05-02 NOTE — Discharge Summary (Signed)
Physician Discharge Summary  Patient ID: Jackie Walls MRN: [id number] DOB/AGE: [birth year] 68 y.o.  Admit date: 04/24/2013 Discharge date: 05/02/2013  Admission Diagnoses: Perforated peptic ulcer with peritonitis, severe protein calorie malnutrition  Discharge Diagnoses: Same Active Problems:   Intra-abdominal free air of unknown etiology   Bowel perforation   Protein-calorie malnutrition, severe   Discharged Condition: fair  Hospital Course: Patient is a 68 year old white female multiple medical problems who presented emergency room with worsening abdominal pain. She was found on CT scan the abdomen had perforated viscus. She was taken emergently to the operating room and underwent exploratory laparotomy, gastrorrhaphy. She was found to have an anterior perforated prepyloric ulcer. She tolerated the surgery well. Postoperative course was remarkable for intermittent bleeding from her incision. She did require blood transfusions do to acute on chronic anemia. Her incision finally stop bleeding. Of note was the fact that she was on Plavix prior to the surgery. Her diet was advanced at difficulty once her bowel function returned. Her H. pylori serology was negative. She is having some diarrhea, C. difficile is pending. Her JP drain has been removed. She is being discharged to a skilled nursing facility in fair condition. She can either see me in my office or have her staples removed on 05/10/2013.  Treatments: surgery: Sports or laparotomy, gastrorrhaphy and 04/25/2013  Discharge Exam: Blood pressure 191/82, pulse 93, temperature 97.2 F (36.2 C), temperature source Oral, resp. rate 18, height 5\' 5"  (1.651 m), weight 56 kg (123 lb 7.3 oz), SpO2 97.00%. General appearance: alert, cooperative, appears stated age and no distress Resp: Bilateral minimal inspiratory wheezing. Baseline for this patient. Cardio: regular rate and rhythm, S1, S2 normal, no murmur, click, rub or gallop GI: soft,  non-tender; bowel sounds normal; no masses,  no organomegaly and Incision healing well.  Disposition: 01-Home or Self Care   Future Appointments Provider Department Dept Phone   11/20/2013 10:30 AM [facility] AT [facility] [phone]   Liquids only 4 hours prior to your exam. Any medications can be taken as usual. Please arrive 15 min prior to your scheduled exam time.   11/20/2013 11:30 AM [facility]  [facility]ARDI[facility]ASCULAR IM[facility]ING [address] [phone]   11/20/2013 12:30 PM Mal Misty, MD [facility] [phone]       Medication List    STOP taking these medications       amoxicillin-clavulanate 1000-62.5 MG per tablet  Commonly known as:  AUGMENTIN XR     azithromycin 250 MG tablet  Commonly known as:  ZITHROMAX      TAKE these medications       alendronate 70 MG tablet  Commonly known as:  FOSAMAX  Take 70 mg by mouth once a week. Take with a full glass of water on an empty stomach.     amLODipine 5 MG tablet  Commonly known as:  NORVASC  Take 5 mg by mouth every morning.     atorvastatin 20 MG tablet  Commonly known as:  LIPITOR  Take 20 mg by mouth every evening.     BREO ELLIPTA 100-25 MCG/INH Aepb  Generic drug:  Fluticasone Furoate-Vilanterol  Inhale 1 Inhaler into the lungs daily.     budesonide-formoterol 160-4.5 MCG/ACT inhaler  Commonly known as:  SYMBICORT  Inhale 2 puffs into the lungs 2 (two) times daily.     calcium-vitamin D 500-200 MG-UNIT per tablet  Commonly known as:  OSCAL WITH D  Take 1  tablet by mouth 2 (two) times daily.     cetirizine 10 MG tablet  Commonly known as:  ZYRTEC  Take 10 mg by mouth every morning.     cholecalciferol 1000 UNITS tablet  Commonly known as:  VITAMIN D  Take 1,000 Units by mouth every evening.     clopidogrel 75 MG tablet  Commonly known as:  PLAVIX  Take 75 mg by mouth daily with breakfast.     doxazosin 4 MG tablet  Commonly known  as:  CARDURA  Take 4 mg by mouth every morning.     HEMATINIC PLUS VIT/MINERALS PO  Take 1 tablet by mouth 2 (two) times daily.     hydrochlorothiazide 12.5 MG capsule  Commonly known as:  MICROZIDE  Take 12.5 mg by mouth daily.     HYDROcodone-acetaminophen 5-325 MG per tablet  Commonly known as:  NORCO/VICODIN  Take 1 tablet by mouth every 4 (four) hours as needed for moderate pain.     losartan 50 MG tablet  Commonly known as:  COZAAR  Take 100 mg by mouth every morning.     meloxicam 7.5 MG tablet  Commonly known as:  MOBIC  Take 7.5 mg by mouth 2 (two) times daily.     multivitamin with minerals Tabs tablet  Take 1 tablet by mouth daily.     omeprazole 20 MG capsule  Commonly known as:  PRILOSEC  Take 1 capsule (20 mg total) by mouth 2 (two) times daily.     ondansetron 4 MG disintegrating tablet  Commonly known as:  ZOFRAN-ODT  Take 4 mg by mouth every 8 (eight) hours as needed for nausea or vomiting.     PROAIR HFA 108 (90 BASE) MCG/ACT inhaler  Generic drug:  albuterol  Inhale 2 puffs into the lungs every 6 (six) hours as needed for wheezing.           Follow-up Information   Follow up with Jamesetta So, MD. Schedule an appointment as soon as possible for a visit on 05/10/2013. (As needed, Can remove staples on 05/10/13.)    Specialty:  General Surgery   Contact information:   [id number] [id number] [phone]       Signed: Jamesetta So 05/02/2013, 9:14 AM

## 2013-05-02 NOTE — Progress Notes (Signed)
NURSING PROGRESS NOTE  FRITZI SCRIPTER [id number] Discharge Data: 05/02/2013 2:38 PM Attending Provider: No att. providers found BMW:UXLKGM,WNUUVOZ Herbie [place], MD   [name] to be D/C'd Skilled nursing facility per MD order.    All IV's will be discontinued and monitored for bleeding.  All belongings will be returned to patient for patient to take home.  Last Documented Vital Signs:  Blood pressure 170/74, pulse 93, temperature 97.2 F (36.2 C), temperature source Oral, resp. rate 18, height 5\' 5"  (1.651 m), weight 56 kg (123 lb 7.3 oz), SpO2 97.00%.  Cathlyn Parsons Rainen Vanrossum

## 2013-05-02 NOTE — Progress Notes (Signed)
Report called to Claiborne Billings, nurse at [facility].

## 2013-05-02 NOTE — Discharge Instructions (Signed)
Peptic Ulcer °A peptic ulcer is a painful sore in the lining of in your esophagus, stomach, or in the first part of your small intestine. The main causes of an ulcer can be: °· An infection. °· Using certain pain medicines too often or too much. °· Smoking. °HOME CARE °· Avoid smoking, alcohol, and caffeine. °· Avoid foods that bother you. °· Only take medicine as told by your doctor. Do not take any medicines your doctor has not approved. °· Keep all doctor visits as told. °GET HELP RIGHT AWAY IF: °· You do not get better in 7 days after starting treatment. °· You keep having an upset stomach (indigestion) or heartburn. °· You have sudden, sharp, or lasting belly (abdominal) pain. °· You have bloody, black, or tarry poop (stool). °· You throw up (vomit) blood or your throw up looks like coffee grounds. °· You get light headed, weak, or feel like you will pass out (faint). °· You get sweaty or feel sticky and cold to the touch (clammy). °MAKE SURE YOU:  °· Understand these instructions. °· Will watch your condition. °· Will get help right away if you are not doing well or get worse. °Document Released: 03/17/2009 Document Revised: 09/15/2011 Document Reviewed: 07/21/2011 °ExitCare® Patient Information ©2014 [employer]. ° °

## 2013-05-02 NOTE — Clinical Social Work Note (Signed)
SNF bed offers rec'd provided to patient and family- they have selected bed at [facility]- bed secured and will plan transfer via EMS after lunch today.  [name], MSW, [place]

## 2013-05-02 NOTE — Clinical Social Work Placement (Signed)
Clinical Social Work Department CLINICAL SOCIAL WORK PLACEMENT NOTE 05/02/2013  Patient:  Jackie Walls, Jackie Walls  Account Number:  [id number] Admit date:  04/24/2013  Clinical Social Worker:  Daiva Huge  Date/time:  05/01/2013 03:35 PM  Clinical Social Work is seeking post-discharge placement for this patient at the following level of care:   [place]   (*CSW will update this form in Epic as items are completed)   05/01/2013  Patient/family provided with [place] Department of Clinical Social Work's list of facilities offering this level of care within the geographic area requested by the patient (or if unable, by the patient's family).  05/01/2013  Patient/family informed of their freedom to choose among providers that offer the needed level of care, that participate in Medicare, Medicaid or managed care program needed by the patient, have an available bed and are willing to accept the patient.  05/01/2013  Patient/family informed of [facility]' ownership interest in [facility], as well as of the fact that they are under no obligation to receive care at this facility.  PASARR submitted to EDS on 05/01/2013 PASARR number received from EDS on 05/01/2013  FL2 transmitted to all facilities in geographic area requested by pt/family on  05/01/2013 FL2 transmitted to all facilities within larger geographic area on   Patient informed that his/her managed care company has contracts with or will negotiate with  certain facilities, including the following:     Patient/family informed of bed offers received:  05/02/2013 Patient chooses bed at [facility] [place] Physician recommends and patient chooses bed at    Patient to be transferred to [place] on  05/02/2013 Patient to be transferred to facility by EMS  The following physician request were entered in Epic:   Additional Comments:  [name], MSW, [place]

## 2013-05-13 ENCOUNTER — Emergency Department (HOSPITAL_COMMUNITY): Payer: Medicare Other

## 2013-05-13 ENCOUNTER — Encounter (HOSPITAL_COMMUNITY): Payer: Self-pay | Admitting: Emergency Medicine

## 2013-05-13 ENCOUNTER — Inpatient Hospital Stay (HOSPITAL_COMMUNITY)
Admission: EM | Admit: 2013-05-13 | Discharge: 2013-05-18 | DRG: 377 | Disposition: A | Discharge status: Home Health | Payer: Medicare Other | Attending: Internal Medicine | Admitting: Internal Medicine

## 2013-05-13 DIAGNOSIS — IMO0002 Reserved for concepts with insufficient information to code with codable children: Secondary | ICD-10-CM

## 2013-05-13 DIAGNOSIS — I10 Essential (primary) hypertension: Secondary | ICD-10-CM

## 2013-05-13 DIAGNOSIS — R195 Other fecal abnormalities: Secondary | ICD-10-CM

## 2013-05-13 DIAGNOSIS — E43 Unspecified severe protein-calorie malnutrition: Secondary | ICD-10-CM

## 2013-05-13 DIAGNOSIS — K2970 Gastritis, unspecified, without bleeding: Secondary | ICD-10-CM | POA: Diagnosis present

## 2013-05-13 DIAGNOSIS — K264 Chronic or unspecified duodenal ulcer with hemorrhage: Secondary | ICD-10-CM

## 2013-05-13 DIAGNOSIS — D62 Acute posthemorrhagic anemia: Secondary | ICD-10-CM

## 2013-05-13 DIAGNOSIS — J4489 Other specified chronic obstructive pulmonary disease: Secondary | ICD-10-CM | POA: Diagnosis present

## 2013-05-13 DIAGNOSIS — I714 Abdominal aortic aneurysm, without rupture, unspecified: Secondary | ICD-10-CM

## 2013-05-13 DIAGNOSIS — J449 Chronic obstructive pulmonary disease, unspecified: Secondary | ICD-10-CM

## 2013-05-13 DIAGNOSIS — Z8673 Personal history of transient ischemic attack (TIA), and cerebral infarction without residual deficits: Secondary | ICD-10-CM

## 2013-05-13 DIAGNOSIS — Z79899 Other long term (current) drug therapy: Secondary | ICD-10-CM

## 2013-05-13 DIAGNOSIS — R0603 Acute respiratory distress: Secondary | ICD-10-CM

## 2013-05-13 DIAGNOSIS — J9610 Chronic respiratory failure, unspecified whether with hypoxia or hypercapnia: Secondary | ICD-10-CM

## 2013-05-13 DIAGNOSIS — Z7902 Long term (current) use of antithrombotics/antiplatelets: Secondary | ICD-10-CM

## 2013-05-13 DIAGNOSIS — K922 Gastrointestinal hemorrhage, unspecified: Secondary | ICD-10-CM

## 2013-05-13 DIAGNOSIS — K2960 Other gastritis without bleeding: Secondary | ICD-10-CM | POA: Diagnosis present

## 2013-05-13 DIAGNOSIS — E785 Hyperlipidemia, unspecified: Secondary | ICD-10-CM | POA: Diagnosis present

## 2013-05-13 DIAGNOSIS — D649 Anemia, unspecified: Secondary | ICD-10-CM

## 2013-05-13 DIAGNOSIS — E876 Hypokalemia: Secondary | ICD-10-CM | POA: Diagnosis present

## 2013-05-13 DIAGNOSIS — F17200 Nicotine dependence, unspecified, uncomplicated: Secondary | ICD-10-CM | POA: Diagnosis present

## 2013-05-13 DIAGNOSIS — Z9981 Dependence on supplemental oxygen: Secondary | ICD-10-CM

## 2013-05-13 HISTORY — DX: Anxiety disorder, unspecified: F41.9

## 2013-05-13 HISTORY — DX: Abdominal aortic aneurysm, without rupture: I71.4

## 2013-05-13 HISTORY — DX: Abdominal aortic aneurysm, without rupture, unspecified: I71.40

## 2013-05-13 LAB — CBC
HCT: 21.9 % — ABNORMAL LOW (ref 36.0–46.0)
HEMOGLOBIN: 7.7 g/dL — AB (ref 12.0–15.0)
MCH: 30.4 pg (ref 26.0–34.0)
MCHC: 35.2 g/dL (ref 30.0–36.0)
MCV: 86.6 fL (ref 78.0–100.0)
Platelets: 294 10*3/uL (ref 150–400)
RBC: 2.53 MIL/uL — ABNORMAL LOW (ref 3.87–5.11)
RDW: 16.6 % — AB (ref 11.5–15.5)
WBC: 5.3 10*3/uL (ref 4.0–10.5)

## 2013-05-13 LAB — URINALYSIS, ROUTINE W REFLEX MICROSCOPIC
Bilirubin Urine: NEGATIVE
Glucose, UA: NEGATIVE mg/dL
HGB URINE DIPSTICK: NEGATIVE
Ketones, ur: NEGATIVE mg/dL
Leukocytes, UA: NEGATIVE
Nitrite: NEGATIVE
Protein, ur: NEGATIVE mg/dL
SPECIFIC GRAVITY, URINE: 1.01 (ref 1.005–1.030)
Urobilinogen, UA: 0.2 mg/dL (ref 0.0–1.0)
pH: 6 (ref 5.0–8.0)

## 2013-05-13 LAB — CBC WITH DIFFERENTIAL/PLATELET
BASOS PCT: 0 % (ref 0–1)
Basophils Absolute: 0 10*3/uL (ref 0.0–0.1)
EOS PCT: 0 % (ref 0–5)
Eosinophils Absolute: 0 10*3/uL (ref 0.0–0.7)
HCT: 18.2 % — ABNORMAL LOW (ref 36.0–46.0)
HEMOGLOBIN: 6.2 g/dL — AB (ref 12.0–15.0)
LYMPHS PCT: 12 % (ref 12–46)
Lymphs Abs: 1.1 10*3/uL (ref 0.7–4.0)
MCH: 31.2 pg (ref 26.0–34.0)
MCHC: 34.1 g/dL (ref 30.0–36.0)
MCV: 91.5 fL (ref 78.0–100.0)
MONOS PCT: 3 % (ref 3–12)
Monocytes Absolute: 0.3 10*3/uL (ref 0.1–1.0)
NEUTROS PCT: 85 % — AB (ref 43–77)
Neutro Abs: 8 10*3/uL — ABNORMAL HIGH (ref 1.7–7.7)
Platelets: 398 10*3/uL (ref 150–400)
RBC: 1.99 MIL/uL — AB (ref 3.87–5.11)
RDW: 15.4 % (ref 11.5–15.5)
Smear Review: INCREASED
WBC: 9.4 10*3/uL (ref 4.0–10.5)

## 2013-05-13 LAB — COMPREHENSIVE METABOLIC PANEL
ALK PHOS: 100 U/L (ref 39–117)
ALT: 16 U/L (ref 0–35)
AST: 18 U/L (ref 0–37)
Albumin: 2.4 g/dL — ABNORMAL LOW (ref 3.5–5.2)
BUN: 41 mg/dL — ABNORMAL HIGH (ref 6–23)
CHLORIDE: 100 meq/L (ref 96–112)
CO2: 25 mEq/L (ref 19–32)
Calcium: 8.7 mg/dL (ref 8.4–10.5)
Creatinine, Ser: 0.54 mg/dL (ref 0.50–1.10)
GFR calc non Af Amer: >90 mL/min (ref >90)
GLUCOSE: 121 mg/dL — AB (ref 70–99)
POTASSIUM: 3.7 meq/L (ref 3.7–5.3)
SODIUM: 136 meq/L — AB (ref 137–147)
TOTAL PROTEIN: 5.1 g/dL — AB (ref 6.0–8.3)
Total Bilirubin: <0.2 mg/dL — ABNORMAL LOW (ref 0.3–1.2)

## 2013-05-13 LAB — TROPONIN I

## 2013-05-13 LAB — PROTIME-INR
INR: 1.12 (ref 0.00–1.49)
Prothrombin Time: 14.2 seconds (ref 11.6–15.2)

## 2013-05-13 LAB — RETICULOCYTES
RBC.: 2.53 MIL/uL — AB (ref 3.87–5.11)
RETIC CT PCT: 3.1 % (ref 0.4–3.1)
Retic Count, Absolute: 78.4 10*3/uL (ref 19.0–186.0)

## 2013-05-13 LAB — PREPARE RBC (CROSSMATCH)

## 2013-05-13 LAB — PRO B NATRIURETIC PEPTIDE: Pro B Natriuretic peptide (BNP): 847.5 pg/mL — ABNORMAL HIGH (ref 0–125)

## 2013-05-13 LAB — LACTIC ACID, PLASMA: Lactic Acid, Venous: 0.9 mmol/L (ref 0.5–2.2)

## 2013-05-13 MED ORDER — SODIUM CHLORIDE 0.9 % IV BOLUS (SEPSIS)
1000.0000 mL | Freq: Once | INTRAVENOUS | Status: AC
  Administered 2013-05-13: 1000 mL via INTRAVENOUS

## 2013-05-13 MED ORDER — ONDANSETRON HCL 4 MG PO TABS: 4.0000 mg | ORAL_TABLET | Freq: Four times a day (QID) | ORAL | Status: DC | PRN

## 2013-05-13 MED ORDER — BUDESONIDE-FORMOTEROL FUMARATE 160-4.5 MCG/ACT IN AERO
2.0000 | INHALATION_SPRAY | Freq: Two times a day (BID) | RESPIRATORY_TRACT | Status: DC
  Administered 2013-05-13 – 2013-05-17 (×9): 2 via RESPIRATORY_TRACT
  Filled 2013-05-13 (×2): qty 6

## 2013-05-13 MED ORDER — ALBUTEROL SULFATE (2.5 MG/3ML) 0.083% IN NEBU: 2.5000 mg | INHALATION_SOLUTION | Freq: Four times a day (QID) | RESPIRATORY_TRACT | Status: DC

## 2013-05-13 MED ORDER — ACETAMINOPHEN 325 MG PO TABS
650.0000 mg | ORAL_TABLET | Freq: Four times a day (QID) | ORAL | Status: DC | PRN
  Administered 2013-05-13 – 2013-05-17 (×4): 650 mg via ORAL
  Filled 2013-05-13 (×4): qty 2

## 2013-05-13 MED ORDER — IPRATROPIUM BROMIDE 0.02 % IN SOLN: 0.5000 mg | Freq: Four times a day (QID) | RESPIRATORY_TRACT | Status: DC

## 2013-05-13 MED ORDER — SODIUM CHLORIDE 0.9 % IV SOLN
INTRAVENOUS | Status: DC
  Administered 2013-05-13: 50 mL/h via INTRAVENOUS

## 2013-05-13 MED ORDER — FLUTICASONE FUROATE-VILANTEROL 100-25 MCG/INH IN AEPB: 1.0000 | INHALATION_SPRAY | Freq: Every day | RESPIRATORY_TRACT | Status: DC

## 2013-05-13 MED ORDER — ACETAMINOPHEN 650 MG RE SUPP: 650.0000 mg | Freq: Four times a day (QID) | RECTAL | Status: DC | PRN

## 2013-05-13 MED ORDER — BUDESONIDE-FORMOTEROL FUMARATE 160-4.5 MCG/ACT IN AERO
INHALATION_SPRAY | RESPIRATORY_TRACT | Status: AC
  Filled 2013-05-13: qty 6

## 2013-05-13 MED ORDER — ALBUTEROL SULFATE (2.5 MG/3ML) 0.083% IN NEBU: 2.5000 mg | INHALATION_SOLUTION | RESPIRATORY_TRACT | Status: DC | PRN

## 2013-05-13 MED ORDER — SODIUM CHLORIDE 0.9 % IV SOLN
8.0000 mg/h | INTRAVENOUS | Status: DC
  Administered 2013-05-13: 8 mg/h via INTRAVENOUS
  Filled 2013-05-13 (×6): qty 80

## 2013-05-13 MED ORDER — METHYLPREDNISOLONE SODIUM SUCC 125 MG IJ SOLR
125.0000 mg | INTRAMUSCULAR | Status: AC
  Administered 2013-05-13: 125 mg via INTRAVENOUS
  Filled 2013-05-13: qty 2

## 2013-05-13 MED ORDER — PANTOPRAZOLE SODIUM 40 MG IV SOLR: 40.0000 mg | Freq: Two times a day (BID) | INTRAVENOUS | Status: DC

## 2013-05-13 MED ORDER — SODIUM CHLORIDE 0.9 % IJ SOLN
3.0000 mL | Freq: Two times a day (BID) | INTRAMUSCULAR | Status: DC
  Administered 2013-05-14 – 2013-05-18 (×9): 3 mL via INTRAVENOUS

## 2013-05-13 MED ORDER — IPRATROPIUM-ALBUTEROL 0.5-2.5 (3) MG/3ML IN SOLN
3.0000 mL | Freq: Four times a day (QID) | RESPIRATORY_TRACT | Status: DC
  Administered 2013-05-13 – 2013-05-18 (×15): 3 mL via RESPIRATORY_TRACT
  Filled 2013-05-13 (×15): qty 3

## 2013-05-13 MED ORDER — ATORVASTATIN CALCIUM 40 MG PO TABS
40.0000 mg | ORAL_TABLET | Freq: Every day | ORAL | Status: DC
  Administered 2013-05-13 – 2013-05-18 (×6): 40 mg via ORAL
  Filled 2013-05-13 (×5): qty 1

## 2013-05-13 MED ORDER — ONDANSETRON HCL 4 MG/2ML IJ SOLN: 4.0000 mg | Freq: Four times a day (QID) | INTRAMUSCULAR | Status: DC | PRN

## 2013-05-13 NOTE — ED Provider Notes (Signed)
CSN: [id number]     Arrival date & time 05/13/13  1032 History  This chart was scribed for Jackie Muskrat, MD by Jackie Walls, ED Scribe. This patient was seen in room APA07/APA07 and the patient's care was started at 10:39 AM.    Chief Complaint  Patient presents with  . Shortness of Breath   The history is provided by the patient. No language interpreter was used.   HPI Comments: Jackie DROMGOOLE is a 68 y.o. Female with a history of COPD (patient is on 2.5 L of oxygen at home), bronchitis, allergic rhinitis who presents to the Emergency Department complaining of shortness of breath that she states has been progressively worsening since last night. She reports using a breathing treatment at home and EMS administered 5 mg albuterol en route to the ED without significant relief. She denies chest pain, abdominal pain, fever, cough. Patient also has a history of HTN, hyperlipidemia. Patient is a current every day smoker, 0.1 PPD.   Patient is also complaining of some numbness to the left foot onset "some time ago."   Past Medical History  Diagnosis Date  . Hypertension   . Hyperlipidemia   . ALLERGIC RHINITIS   . Bronchitis   . COPD (chronic obstructive pulmonary disease)    Past Surgical History  Procedure Laterality Date  . Cesarean section    . Carotid endarterectomy Right   . Laparotomy N/A 04/25/2013    Procedure: EXPLORATORY LAPAROTOMY;  Surgeon: Jamesetta So, MD;  Location: [facility];  Service: General;  Laterality: N/A;  . Gastrorrhaphy N/A 04/25/2013    Procedure: Toniann Ket;  Surgeon: Jamesetta So, MD;  Location: [facility];  Service: General;  Laterality: N/A;   Family History  Problem Relation Age of Onset  . Cancer Sister    History  Substance Use Topics  . Smoking status: Current Every Day Smoker -- 0.10 packs/day    Types: Cigarettes    Start date: 01/05/1948  . Smokeless tobacco: Not on file  . Alcohol Use: No   OB History   Grav Para Term Preterm Abortions TAB  SAB Ect Mult Living                 Review of Systems  Constitutional: Negative for fever.       Per HPI, otherwise negative  HENT:       Per HPI, otherwise negative  Respiratory: Positive for shortness of breath. Negative for cough.        Per HPI, otherwise negative  Cardiovascular: Negative for chest pain.       Per HPI, otherwise negative  Gastrointestinal: Negative for vomiting and abdominal pain.  Endocrine:       Negative aside from HPI  Genitourinary:       Neg aside from HPI   Musculoskeletal:       Per HPI, otherwise negative  Skin: Negative.   Neurological: Positive for numbness. Negative for syncope.      Allergies  Review of patient's allergies indicates no known allergies.  Home Medications   Prior to Admission medications   Medication Sig Start Date End Date Taking? Authorizing Provider  albuterol (PROAIR HFA) 108 (90 BASE) MCG/ACT inhaler Inhale 2 puffs into the lungs every 6 (six) hours as needed for wheezing.    Historical Provider, MD  alendronate (FOSAMAX) 70 MG tablet Take 70 mg by mouth once a week. Take with a full glass of water on an empty stomach.    Historical Provider,  MD  amLODipine (NORVASC) 5 MG tablet Take 5 mg by mouth every morning.     Historical Provider, MD  atorvastatin (LIPITOR) 20 MG tablet Take 20 mg by mouth every evening.    Historical Provider, MD  B Complex-C-Min-Fe-FA (HEMATINIC PLUS VIT/MINERALS PO) Take 1 tablet by mouth 2 (two) times daily.     Historical Provider, MD  budesonide-formoterol (SYMBICORT) 160-4.5 MCG/ACT inhaler Inhale 2 puffs into the lungs 2 (two) times daily.      Historical Provider, MD  calcium-vitamin D (OSCAL WITH D) 500-200 MG-UNIT per tablet Take 1 tablet by mouth 2 (two) times daily.    Historical Provider, MD  cetirizine (ZYRTEC) 10 MG tablet Take 10 mg by mouth every morning.     Historical Provider, MD  cholecalciferol (VITAMIN D) 1000 UNITS tablet Take 1,000 Units by mouth every evening.     Historical Provider, MD  clopidogrel (PLAVIX) 75 MG tablet Take 75 mg by mouth daily with breakfast.    Historical Provider, MD  doxazosin (CARDURA) 4 MG tablet Take 4 mg by mouth every morning.     Historical Provider, MD  Fluticasone Furoate-Vilanterol (BREO ELLIPTA) 100-25 MCG/INH AEPB Inhale 1 Inhaler into the lungs daily.    Historical Provider, MD  hydrochlorothiazide (MICROZIDE) 12.5 MG capsule Take 12.5 mg by mouth daily.    Historical Provider, MD  HYDROcodone-acetaminophen (NORCO/VICODIN) 5-325 MG per tablet Take 1 tablet by mouth every 4 (four) hours as needed for moderate pain. 05/02/13   Jamesetta So, MD  losartan (COZAAR) 50 MG tablet Take 100 mg by mouth every morning.     Historical Provider, MD  meloxicam (MOBIC) 7.5 MG tablet Take 7.5 mg by mouth 2 (two) times daily.     Historical Provider, MD  Multiple Vitamin (MULTIVITAMIN WITH MINERALS) TABS tablet Take 1 tablet by mouth daily.    Historical Provider, MD  omeprazole (PRILOSEC) 20 MG capsule Take 1 capsule (20 mg total) by mouth 2 (two) times daily. 05/02/13   Jamesetta So, MD  ondansetron (ZOFRAN-ODT) 4 MG disintegrating tablet Take 4 mg by mouth every 8 (eight) hours as needed for nausea or vomiting.    Historical Provider, MD   Triage Vitals: BP 127/75  Pulse 112  Temp(Src) 98.1 F (36.7 C)  Resp 22  Ht 5\' 4"  (1.626 m)  Wt 126 lb (57.153 kg)  BMI 21.62 kg/m2  SpO2 100%  Physical Exam  Nursing note and vitals reviewed. Constitutional: She is oriented to person, place, and time. She appears well-developed and well-nourished. No distress.  HENT:  Head: Normocephalic and atraumatic.  Eyes: Conjunctivae and EOM are normal.  Cardiovascular: Normal rate and regular rhythm.   Pulmonary/Chest: Effort normal. No stridor. No respiratory distress. She has wheezes.  Expiratory wheeze on the right.   Abdominal: She exhibits no distension.  Musculoskeletal: She exhibits no edema.  Neurological: She is alert and oriented to  person, place, and time. No cranial nerve deficit.  Skin: Skin is warm and dry.  Psychiatric: She has a normal mood and affect.    ED Course  Procedures (including critical care time)  DIAGNOSTIC STUDIES: Oxygen Saturation is 100% on nasal cannulation, normal by my interpretation.    COORDINATION OF CARE: 10:42 AM- Ordered chest x-ray, EKG, troponin, CBC, CMP, BNP, UA, and lactic acid. Ordered Solu-Medrol to manage symptoms. Discussed treatment plan with patient at bedside and patient verbalized agreement.   11:41 AM- Discussed abnormal lab results. Will order a blood transfusion and steroids to  manage symptoms. Discussed treatment plan with patient at bedside and patient verbalized agreement.   Labs Review Labs Reviewed  CBC WITH DIFFERENTIAL - Abnormal; Notable for the following:    RBC 1.99 (*)    Hemoglobin 6.2 (*)    HCT 18.2 (*)    Neutrophils Relative % 85 (*)    Neutro Abs 8.0 (*)    All other components within normal limits  COMPREHENSIVE METABOLIC PANEL - Abnormal; Notable for the following:    Sodium 136 (*)    Glucose, Bld 121 (*)    BUN 41 (*)    Total Protein 5.1 (*)    Albumin 2.4 (*)    Total Bilirubin <0.2 (*)    All other components within normal limits  PRO B NATRIURETIC PEPTIDE - Abnormal; Notable for the following:    Pro B Natriuretic peptide (BNP) 847.5 (*)    All other components within normal limits  LACTIC ACID, PLASMA  TROPONIN I  URINALYSIS, ROUTINE W REFLEX MICROSCOPIC  OCCULT BLOOD X 1 CARD TO LAB, STOOL  TYPE AND SCREEN  PREPARE RBC (CROSSMATCH)    Imaging Review Dg Chest Portable 1 View  05/13/2013   CLINICAL DATA:  Shortness of breath, loss of appetite, weakness  EXAM: PORTABLE CHEST - 1 VIEW  COMPARISON:  DG CHEST 1V PORT dated 04/25/2013; DG CHEST 1V PORT dated 03/04/2013; DG CHEST 1V dated 01/31/2013  FINDINGS: The heart size and mediastinal contours are within normal limits. Both lungs are clear. The visualized skeletal structures are  unremarkable.  IMPRESSION: No active disease.   Electronically Signed   By: Skipper Cliche M.D.   On: 05/13/2013 11:09    Cardiac monitor shows weight 121, sinus tachycardia, abnormal   Heme occult positive stool   EKG Interpretation   Date/Time:  Sunday May 13 2013 10:46:42 EDT Ventricular Rate:  114 PR Interval:  131 QRS Duration: 79 QT Interval:  323 QTC Calculation: 445 R Axis:   41 Text Interpretation:  Sinus tachycardia Left ventricular hypertrophy Sinus  tachycardia Left ventricular hypertrophy Abnormal ekg Confirmed by  Jackie Muskrat  MD [phone]) on 05/13/2013 3:08:21 PM     On repeat exam the patient appears more comfortable, though she remains tachycardic.   Update: The patient has heart rate 100, appears much more comfortable than on arrival, he is receiving blood, fluids.  MDM   Final diagnoses:  Symptomatic anemia  Occult blood positive stool  Respiratory distress    I personally performed the services described in this documentation, which was scribed in my presence. The recorded information has been reviewed and is accurate.   Patient presents with dyspnea, fatigue, does not have occult GI bleed, symptomatic anemia, and coupled with the patient's respiratory distress required admission   after initiation of both her steroid therapy and blood transfusions.  CRITICAL CARE Performed by: Jackie Walls Total critical care time 40 Critical care time was exclusive of separately billable procedures and treating other patients. Critical care was necessary to treat or prevent imminent or life-threatening deterioration. Critical care was time spent personally by me on the following activities: development of treatment plan with patient and/or surrogate as well as nursing, discussions with consultants, evaluation of patient's response to treatment, examination of patient, obtaining history from patient or surrogate, ordering and performing treatments and  interventions, ordering and review of laboratory studies, ordering and review of radiographic studies, pulse oximetry and re-evaluation of patient's condition.    Jackie Muskrat, MD 05/13/13 [phone]

## 2013-05-13 NOTE — ED Notes (Signed)
Called 3A and gave report to nurse Tamika for room 312

## 2013-05-13 NOTE — ED Notes (Signed)
Pt c/o sob since last night. Pt had breathing tx at home and albuterol 5mg  in route by ems. Denies cp. Pt has continuous home O2 at 2.5l. nad noted.

## 2013-05-13 NOTE — H&P (Signed)
[facility] History and Physical  Jackie Walls HQI:[id number] DOB: Aug 10, 1945 DOA: 05/13/2013  Referring physician: Dr. Vanita Panda PCP: Sherrie Mustache, MD   Chief Complaint: shortness of breath  HPI: Jackie Walls is a 68 y.o. female who was recently discharged from the hospital after being treated for perforated gastric ulcer. She did have significant anemia during the hospitalization and required blood transfusions. Hemoglobin on discharge was noted to be 9.9. The patient was discharged to a skilled nursing facility. Unfortunately, her family was unhappy with the care the patient was receiving at that facility and decided to discharge her home. She is brought to the emergency room today with complaints of shortness of breath. She reports onset of symptoms occurring yesterday morning. This progressively got worse which has prompted her ER visit. She denies any chest pain or abdominal pain. Denies any fever or cough. Shortness of breath occurred while at rest and has been persistent. She's not had any wheezing. She describes his stool as dark green. She reports having 3-4 bowel movements per day. ER notes indicate that patient was found to have black colored stool which tested heme positive. Lab work in the emergency room indicated a hemoglobin of 6.2. BUN was also elevated at 41. She was initially tachycardic on admission which since improved. The patient is being admitted for further evaluation.   Review of Systems:  Pertinent positives as per HPI, otherwise negative  Past Medical History  Diagnosis Date  . Hypertension   . Hyperlipidemia   . ALLERGIC RHINITIS   . Bronchitis   . COPD (chronic obstructive pulmonary disease)    Past Surgical History  Procedure Laterality Date  . Cesarean section    . Carotid endarterectomy Right   . Laparotomy N/A 04/25/2013    Procedure: EXPLORATORY LAPAROTOMY;  Surgeon: Jamesetta So, MD;  Location: [facility];  Service: General;   Laterality: N/A;  . Gastrorrhaphy N/A 04/25/2013    Procedure: Toniann Ket;  Surgeon: Jamesetta So, MD;  Location: [facility];  Service: General;  Laterality: N/A;   Social History:  reports that she has been smoking Cigarettes.  She started smoking about 65 years ago. She has been smoking about 0.10 packs per day. She does not have any smokeless tobacco history on file. She reports that she does not drink alcohol or use illicit drugs.  No Known Allergies  Family History  Problem Relation Age of Onset  . Cancer Sister      Prior to Admission medications   Medication Sig Start Date End Date Taking? Authorizing Provider  amLODipine (NORVASC) 5 MG tablet Take 5 mg by mouth every morning.    Yes Historical Provider, MD  atorvastatin (LIPITOR) 40 MG tablet Take 40 mg by mouth daily.   Yes Historical Provider, MD  B Complex-C-Min-Fe-FA (HEMATINIC PLUS VIT/MINERALS PO) Take 1 tablet by mouth 2 (two) times daily.    Yes Historical Provider, MD  budesonide-formoterol (SYMBICORT) 160-4.5 MCG/ACT inhaler Inhale 2 puffs into the lungs 2 (two) times daily.     Yes Historical Provider, MD  calcium-vitamin D (OSCAL WITH D) 500-200 MG-UNIT per tablet Take 1 tablet by mouth 2 (two) times daily.   Yes Historical Provider, MD  cetirizine (ZYRTEC) 10 MG tablet Take 10 mg by mouth every morning.    Yes Historical Provider, MD  cholecalciferol (VITAMIN D) 1000 UNITS tablet Take 1,000 Units by mouth every evening.   Yes Historical Provider, MD  clopidogrel (PLAVIX) 75 MG tablet Take 75 mg by mouth daily  with breakfast.   Yes Historical Provider, MD  doxazosin (CARDURA) 4 MG tablet Take 4 mg by mouth every morning.    Yes Historical Provider, MD  Fluticasone Furoate-Vilanterol (BREO ELLIPTA) 100-25 MCG/INH AEPB Inhale 1 Inhaler into the lungs daily.   Yes Historical Provider, MD  losartan (COZAAR) 50 MG tablet Take 100 mg by mouth every morning.    Yes Historical Provider, MD  Multiple Vitamin (MULTIVITAMIN WITH  MINERALS) TABS tablet Take 1 tablet by mouth daily.   Yes Historical Provider, MD  alendronate (FOSAMAX) 70 MG tablet Take 70 mg by mouth once a week. Take with a full glass of water on an empty stomach.    Historical Provider, MD  omeprazole (PRILOSEC) 20 MG capsule Take 1 capsule (20 mg total) by mouth 2 (two) times daily. 05/02/13   Jamesetta So, MD   Physical Exam: Filed Vitals:   05/13/13 1400  BP: 119/68  Pulse: 99  Temp: 99.2 F (37.3 C)  Resp:     BP 119/68  Pulse 99  Temp(Src) 99.2 F (37.3 C) (Oral)  Resp 18  Ht 5\' 4"  (1.626 m)  Wt 57.153 kg (126 lb)  BMI 21.62 kg/m2  SpO2 100%  General:  Pale, NAD, sleeping on my arrival Eyes: PERRL, normal lids, irises & conjunctiva ENT: grossly normal hearing, lips & tongue Neck: no LAD, masses or thyromegaly Cardiovascular: RRR, no m/r/g. No LE edema. Telemetry: SR, no arrhythmias  Respiratory: CTA bilaterally, no w/r/r. Mild increased respiratory effort. Abdomen: soft, tender of epigastric area where surgical staples are still present, skin does not appear infected, bs+ Skin: no rash or induration seen on limited exam Musculoskeletal: grossly normal tone BUE/BLE Psychiatric: grossly normal mood and affect, speech fluent and appropriate Neurologic: grossly non-focal.          Labs on Admission:  Basic Metabolic Panel:  Recent Labs Lab 05/13/13 1053  NA 136*  K 3.7  CL 100  CO2 25  GLUCOSE 121*  BUN 41*  CREATININE 0.54  CALCIUM 8.7   Liver Function Tests:  Recent Labs Lab 05/13/13 1053  AST 18  ALT 16  ALKPHOS 100  BILITOT <0.2*  PROT 5.1*  ALBUMIN 2.4*   No results found for this basename: LIPASE, AMYLASE,  in the last 168 hours No results found for this basename: AMMONIA,  in the last 168 hours CBC:  Recent Labs Lab 05/13/13 1053  WBC 9.4  NEUTROABS 8.0*  HGB 6.2*  HCT 18.2*  MCV 91.5  PLT 398   Cardiac Enzymes:  Recent Labs Lab 05/13/13 1053  TROPONINI <0.30    BNP (last 3  results)  Recent Labs  05/13/13 1053  PROBNP 847.5*   CBG: No results found for this basename: GLUCAP,  in the last 168 hours  Radiological Exams on Admission: Dg Chest Portable 1 View  05/13/2013   CLINICAL DATA:  Shortness of breath, loss of appetite, weakness  EXAM: PORTABLE CHEST - 1 VIEW  COMPARISON:  DG CHEST 1V PORT dated 04/25/2013; DG CHEST 1V PORT dated 03/04/2013; DG CHEST 1V dated 01/31/2013  FINDINGS: The heart size and mediastinal contours are within normal limits. Both lungs are clear. The visualized skeletal structures are unremarkable.  IMPRESSION: No active disease.   Electronically Signed   By: Skipper Cliche M.D.   On: 05/13/2013 11:09    EKG: Independently reviewed. Sinus tachycardia  Assessment/Plan Active Problems:   COPD (chronic obstructive pulmonary disease)   HTN (hypertension)   Protein-calorie malnutrition, severe  Symptomatic anemia   Heme positive stool   Chronic respiratory failure   GI bleed   1. GI bleeding. Patient is having dark colored stool, significant anemia and elevated BUN. We'll start the patient on Protonix twice a day. Keep the patient on clear liquids today and n.p.o. after midnight. GI consultation will be requested. On her previous admission, patient's serum IgG antibody for H. pylori was negative. 2. Acute blood loss anemia. Patient is being transfused 2 units of PRBCs. We'll check anemia panel. Follow serial hemoglobins. 3. COPD and chronic respiratory failure. Appears to be stable at this time. She did receive a dose of steroids in the emergency room, although she does not appear to have any significant wheezing. We will continue her bronchodilators for the time being. Continue her on oxygen. 4. Hypertension. With her ongoing GI bleeding, we will hold her antihypertensives for now. He can certainly be resumed as needed. 5. Cerebrovascular disease status post right carotid endarterectomy. Patient is on Plavix which I assume is her  cerebrovascular disease. I do not find any documentation of coronary disease. This will be held for the time being in light of the ongoing bleeding.  Code Status: full code Family Communication: discussed with patient, no family at bedside, contact number for husband in epic is incorrect Disposition Plan: pending hospital course  Time spent: 2mins  Aerabella Galasso [facility] Pager [phone]

## 2013-05-13 NOTE — ED Notes (Signed)
Hospitalist in room with patient at this time.  

## 2013-05-13 NOTE — ED Notes (Signed)
Pt cleaned. Small amount black watery stool noted. Hemoccult positive.

## 2013-05-14 ENCOUNTER — Encounter (HOSPITAL_COMMUNITY): Payer: Self-pay | Admitting: Gastroenterology

## 2013-05-14 ENCOUNTER — Inpatient Hospital Stay (HOSPITAL_COMMUNITY): Payer: Medicare Other

## 2013-05-14 ENCOUNTER — Encounter (HOSPITAL_COMMUNITY)
Admission: EM | Disposition: A | Discharge status: Home Health | Payer: Self-pay | Source: Home / Self Care | Attending: Internal Medicine

## 2013-05-14 DIAGNOSIS — I714 Abdominal aortic aneurysm, without rupture, unspecified: Secondary | ICD-10-CM

## 2013-05-14 DIAGNOSIS — D62 Acute posthemorrhagic anemia: Secondary | ICD-10-CM

## 2013-05-14 LAB — IRON AND TIBC
Iron: 153 ug/dL — ABNORMAL HIGH (ref 42–135)
UIBC: <15 ug/dL — ABNORMAL LOW (ref 125–400)

## 2013-05-14 LAB — BASIC METABOLIC PANEL
BUN: 40 mg/dL — ABNORMAL HIGH (ref 6–23)
CO2: 21 mEq/L (ref 19–32)
Calcium: 7.8 mg/dL — ABNORMAL LOW (ref 8.4–10.5)
Chloride: 109 mEq/L (ref 96–112)
Creatinine, Ser: 0.43 mg/dL — ABNORMAL LOW (ref 0.50–1.10)
GFR calc Af Amer: >90 mL/min (ref >90)
GFR calc non Af Amer: >90 mL/min (ref >90)
Glucose, Bld: 96 mg/dL (ref 70–99)
POTASSIUM: 3.6 meq/L — AB (ref 3.7–5.3)
Sodium: 139 mEq/L (ref 137–147)

## 2013-05-14 LAB — CBC
HCT: 18 % — ABNORMAL LOW (ref 36.0–46.0)
HEMOGLOBIN: 6.3 g/dL — AB (ref 12.0–15.0)
MCH: 30.3 pg (ref 26.0–34.0)
MCHC: 35 g/dL (ref 30.0–36.0)
MCV: 86.5 fL (ref 78.0–100.0)
Platelets: 281 10*3/uL (ref 150–400)
RBC: 2.08 MIL/uL — ABNORMAL LOW (ref 3.87–5.11)
RDW: 17.9 % — ABNORMAL HIGH (ref 11.5–15.5)
WBC: 6.9 10*3/uL (ref 4.0–10.5)

## 2013-05-14 LAB — OCCULT BLOOD, POC DEVICE: Fecal Occult Bld: POSITIVE — AB

## 2013-05-14 LAB — FOLATE: Folate: >20 ng/mL

## 2013-05-14 LAB — PREPARE RBC (CROSSMATCH)

## 2013-05-14 LAB — HEMOGLOBIN AND HEMATOCRIT, BLOOD
HCT: 27.1 % — ABNORMAL LOW (ref 36.0–46.0)
HEMOGLOBIN: 9.7 g/dL — AB (ref 12.0–15.0)

## 2013-05-14 LAB — FERRITIN: Ferritin: 1000 ng/mL — ABNORMAL HIGH (ref 10–291)

## 2013-05-14 LAB — VITAMIN B12: Vitamin B-12: 672 pg/mL (ref 211–911)

## 2013-05-14 SURGERY — EGD (ESOPHAGOGASTRODUODENOSCOPY)
Anesthesia: Moderate Sedation

## 2013-05-14 MED ORDER — ACETAMINOPHEN 325 MG PO TABS
650.0000 mg | ORAL_TABLET | Freq: Once | ORAL | Status: AC
  Administered 2013-05-14: 650 mg via ORAL
  Filled 2013-05-14: qty 2

## 2013-05-14 MED ORDER — FUROSEMIDE 10 MG/ML IJ SOLN
20.0000 mg | Freq: Once | INTRAMUSCULAR | Status: AC
  Administered 2013-05-14: 20 mg via INTRAVENOUS
  Filled 2013-05-14: qty 2

## 2013-05-14 MED ORDER — BOOST / RESOURCE BREEZE PO LIQD
1.0000 | Freq: Two times a day (BID) | ORAL | Status: DC
  Administered 2013-05-15 – 2013-05-18 (×4): 1 via ORAL

## 2013-05-14 MED ORDER — IOHEXOL 350 MG/ML SOLN
100.0000 mL | Freq: Once | INTRAVENOUS | Status: AC | PRN
  Administered 2013-05-14: 100 mL via INTRAVENOUS

## 2013-05-14 MED ORDER — PANTOPRAZOLE SODIUM 40 MG IV SOLR: 40.0000 mg | Freq: Two times a day (BID) | INTRAVENOUS | Status: DC

## 2013-05-14 MED ORDER — BENZONATATE 100 MG PO CAPS
100.0000 mg | ORAL_CAPSULE | Freq: Three times a day (TID) | ORAL | Status: DC
  Administered 2013-05-14 – 2013-05-18 (×13): 100 mg via ORAL
  Filled 2013-05-14 (×12): qty 1

## 2013-05-14 MED ORDER — NICOTINE 14 MG/24HR TD PT24
14.0000 mg | MEDICATED_PATCH | Freq: Every day | TRANSDERMAL | Status: DC
  Administered 2013-05-14 – 2013-05-15 (×2): 14 mg via TRANSDERMAL
  Filled 2013-05-14 (×5): qty 1

## 2013-05-14 MED ORDER — PANTOPRAZOLE SODIUM 40 MG IV SOLR
8.0000 mg/h | INTRAVENOUS | Status: DC
  Administered 2013-05-14 – 2013-05-15 (×2): 8 mg/h via INTRAVENOUS
  Filled 2013-05-14 (×3): qty 80

## 2013-05-14 MED ORDER — DIPHENHYDRAMINE HCL 25 MG PO CAPS
25.0000 mg | ORAL_CAPSULE | Freq: Once | ORAL | Status: AC
  Administered 2013-05-14: 25 mg via ORAL
  Filled 2013-05-14: qty 1

## 2013-05-14 NOTE — Progress Notes (Signed)
[facility] PROGRESS NOTE  Jackie Walls JKK:[id number] DOB: Jun 02, 1945 DOA: 05/13/2013 PCP: Sherrie Mustache, MD  Assessment/Plan: #1 GI bleed Likely upper GI bleed as patient with melanotic stools and elevated BUN. Patient was recently hospitalized and treated for perforated gastric ulcer from 4/21 to 05/02/2013. H. pylori serologies from last hospitalization was negative. Patient's hemoglobin on admission was 6.2. Patient was transfused 2 units packed red blood cells and hemoglobin currently at 6.3 this morning. Will change IV Protonix to Protonix drip. Transfuse 2 more units of packed red blood cells and monitor closely for volume overload. Serial CBCs. GI consultation pending. Follow.  #2 acute blood loss anemia/symptomatic anemia Secondary to problem #1. Patient on admission had a hemoglobin of 6.2. Patient's last hemoglobin from her last hospitalization was 9.9 on 04/30/2013. Patient is status post 2 units packed red blood cells and repeat hemoglobin is currently at 6.3. Will transfuse 2 more units of packed red blood cells. Follow H&H. Transfusion threshold hemoglobin less than 7 or active bleeding.  #3 COPD and chronic respiratory failure Stable. Continue bronchodilators. Continue oxygen. We'll place on [employer]. Follow.  #4 hypertension BP meds on hold. Follow.  #5 history of CVA status post right carotid endarterectomy Patient was on Plavix for probable secondary stroke prevention. Plavix has been held secondary to problem #1. GI to determine when plavix may be resumed.  #6 tobacco abuse Tobacco cessation. Will place on a nicotine patch.  #7 prophylaxis PPI for GI prophylaxis. SCDs for DVT prophylaxis.  Code Status: Full Family Communication: Updated patient no family at bedside. Disposition Plan: : Home vs SNF when medically stable.   Consultants:  Gastroenterology pending  Procedures:  Chest x-ray 05/13/2013   2 units packed red blood cells  05/13/2013.  2 units packed red blood cells pending   Antibiotics:  None  HPI/Subjective: Patient states has not had any further blackish/greenish loose stools this morning.  Objective: Filed Vitals:   05/14/13 0832  BP: 145/72  Pulse: 83  Temp:   Resp:     Intake/Output Summary (Last 24 hours) at 05/14/13 1027 Last data filed at 05/14/13 0859  Gross per 24 hour  Intake  947.5 ml  Output    200 ml  Net  747.5 ml   Filed Weights   05/13/13 1031 05/13/13 1708  Weight: 57.153 kg (126 lb) 52.1 kg (114 lb 13.8 oz)    Exam:   General:  NAD  Cardiovascular: RRR  Respiratory: No crackles. Minimal expiratory wheezing.  Abdomen: Soft, nontender, nondistended, positive bowel sounds. Incision site looks clean dry and intact.  Musculoskeletal: No clubbing cyanosis or edema.   Data Reviewed: Basic Metabolic Panel:  Recent Labs Lab 05/13/13 1053 05/14/13 0519  NA 136* 139  K 3.7 3.6*  CL 100 109  CO2 25 21  GLUCOSE 121* 96  BUN 41* 40*  CREATININE 0.54 0.43*  CALCIUM 8.7 7.8*   Liver Function Tests:  Recent Labs Lab 05/13/13 1053  AST 18  ALT 16  ALKPHOS 100  BILITOT <0.2*  PROT 5.1*  ALBUMIN 2.4*   No results found for this basename: LIPASE, AMYLASE,  in the last 168 hours No results found for this basename: AMMONIA,  in the last 168 hours CBC:  Recent Labs Lab 05/13/13 1053 05/13/13 2140 05/14/13 0519  WBC 9.4 5.3 6.9  NEUTROABS 8.0*  --   --   HGB 6.2* 7.7* 6.3*  HCT 18.2* 21.9* 18.0*  MCV 91.5 86.6 86.5  PLT 398 294 281  Cardiac Enzymes:  Recent Labs Lab 05/13/13 1053  TROPONINI <0.30   BNP (last 3 results)  Recent Labs  05/13/13 1053  PROBNP 847.5*   CBG: No results found for this basename: GLUCAP,  in the last 168 hours  No results found for this or any previous visit (from the past 240 hour(s)).   Studies: Dg Chest Portable 1 View  05/13/2013   CLINICAL DATA:  Shortness of breath, loss of appetite, weakness  EXAM:  PORTABLE CHEST - 1 VIEW  COMPARISON:  DG CHEST 1V PORT dated 04/25/2013; DG CHEST 1V PORT dated 03/04/2013; DG CHEST 1V dated 01/31/2013  FINDINGS: The heart size and mediastinal contours are within normal limits. Both lungs are clear. The visualized skeletal structures are unremarkable.  IMPRESSION: No active disease.   Electronically Signed   By: Skipper Cliche M.D.   On: 05/13/2013 11:09    Scheduled Meds: . acetaminophen  650 mg Oral Once  . atorvastatin  40 mg Oral Daily  . benzonatate  100 mg Oral TID  . budesonide-formoterol  2 puff Inhalation BID  . diphenhydrAMINE  25 mg Oral Once  . furosemide  20 mg Intravenous Once  . ipratropium-albuterol  3 mL Nebulization Q6H  . nicotine  14 mg Transdermal Daily  . [START ON 05/17/2013] pantoprazole (PROTONIX) IV  40 mg Intravenous Q12H  . sodium chloride  3 mL Intravenous Q12H   Continuous Infusions: . sodium chloride 50 mL/hr (05/13/13 1845)  . pantoprozole (PROTONIX) infusion      Principal Problem:   GI bleed Active Problems:   COPD (chronic obstructive pulmonary disease)   Hyperlipidemia   HTN (hypertension)   Protein-calorie malnutrition, severe   Symptomatic anemia   Heme positive stool   Chronic respiratory failure   Acute blood loss anemia    Time spent: 35 minutes    Eugenie Filler M.D. [facility] Pager [phone]. If 7PM-7AM, please contact night-coverage at [email or web address], password [facility] 05/14/2013, 10:27 AM  LOS: 1 day

## 2013-05-14 NOTE — Care Management Note (Addendum)
    Page 1 of 2   05/18/2013     8:53:34 AM CARE MANAGEMENT NOTE 05/18/2013  Patient:  Jackie Walls, Jackie Walls   Account Number:  [id number]  Date Initiated:  05/14/2013  Documentation initiated by:  Theophilus Kinds  Subjective/Objective Assessment:   Pt admitted from home with gi bleeding. Pt lives with her husband and 2 sons. Per pts sisters, the sons will not assist pt and husband is unable. Pt states that she is able to care for herself but sisters say she is not able to care for     Action/Plan:   herself. Pt states that she has a walker, BSC, O2 with [employer]. Will continue to follow for discharge planning needs. PT is agreeable to [facility].   Anticipated DC Date:  05/18/2013   Anticipated DC Plan:  [place]  CM consult      [facility] Choice  HOME HEALTH   Choice offered to / List presented to:  C-1 Patient        [place] arranged  HH-1 RN  [place]      [place].   Status of service:  Completed, signed off Medicare Important Message given?  YES (If response is "NO", the following Medicare IM given date fields will be blank) Date Medicare IM given:  05/18/2013 Date Additional Medicare IM given:    Discharge Disposition:  [place]  Per UR Regulation:    If discussed at Long Length of Stay Meetings, dates discussed:    Comments:  05/18/13 [place], RN BSN CM Pt potential discharge today or 05/19/13 with [facility] HH (per pts choice). Romualdo Bolk of [facility] is aware and will collect the pts information from the chart. [place] services to start within 48 hours of discharge. No DME needs noted. Pt and pts nurse aware of discharge arrangements.  05/14/13 [place], RN BSN CM

## 2013-05-14 NOTE — Progress Notes (Signed)
Spoke with Dr. Gala Romney about the patients diet.  We reviewed the CT results together and new orders were given and followed.

## 2013-05-14 NOTE — Progress Notes (Signed)
UR chart review completed.  

## 2013-05-14 NOTE — Progress Notes (Signed)
PT Cancellation Note  Patient Details Name: Jackie Walls MRN: [id number] DOB: 04-02-1945   Cancelled Treatment:     PT order received. Pt's hbg is 6.3 this am. Will hold PT eval until pt's hbg is in an acceptable range.   [name] 05/14/2013, 7:57 AM

## 2013-05-14 NOTE — Progress Notes (Signed)
INITIAL NUTRITION ASSESSMENT  DOCUMENTATION CODES Per approved criteria  -Severe malnutrition in the context of chronic illness   INTERVENTION: Resource Breeze po BID, each supplement provides 250 kcal and 9 grams of protein  NUTRITION DIAGNOSIS: Malnutrition; ongoing related to severe fat and muscle depletion as evidenced by nutrition focused physical exam on 04/25/13 and additional 10# wt loss (8%) <30 days.   Goal: Pt to meet >/= 90% of their estimated nutrition needs     Monitor: diet advancement and tolerance, labs and weights   Reason for Assessment: Malnutrition Screen Score =  3  68 y.o. female  Admitting Dx: GI bleed  ASSESSMENT: Pt recently admitted due to perforated ulcer with peritonitis and severe malnutrition (RD assessed 04/25/13). Presented yesterday with upper GI bleed, heme positive stool, anemia.  She is to receive 2 additional units PRBC. CT abdomen and pelvis  05/14/13.  Pt reports poor po intake over past month. She has 10#(8%) wt loss <30 days which is severe. Her diet has been advanced this afternoon (Clear Liquids) then she'll be NPO after MN for EGD tomorrow.  Height: Ht Readings from Last 1 Encounters:  05/13/13 5\' 4"  (1.626 m)    Weight: Wt Readings from Last 1 Encounters:  05/13/13 114 lb 13.8 oz (52.1 kg)    Ideal Body Weight: 120# (54.5 kg)  % Ideal Body Weight: 96%  Wt Readings from Last 10 Encounters:  05/13/13 114 lb 13.8 oz (52.1 kg)  05/13/13 114 lb 13.8 oz (52.1 kg)  04/30/13 123 lb 7.3 oz (56 kg)  04/30/13 123 lb 7.3 oz (56 kg)  03/15/13 125 lb (56.7 kg)  11/14/12 132 lb 8 oz (60.102 kg)  02/15/12 140 lb (63.504 kg)  01/29/12 140 lb 1 oz (63.532 kg)    Usual Body Weight: 133#  % Usual Body Weight: 86%  BMI:  Body mass index is 19.71 kg/(m^2).normal range   Estimated Nutritional Needs: Kcal: 1600-1800 Protein: 78-93 gr Fluid: >1600 ml daily  Skin: no new issues  Diet Order: NPO  EDUCATION NEEDS: -Education needs  addressed   Intake/Output Summary (Last 24 hours) at 05/14/13 1431 Last data filed at 05/14/13 1100  Gross per 24 hour  Intake   1285 ml  Output    200 ml  Net   1085 ml    Last BM: 05/13/13 diarrhea  Labs:   Recent Labs Lab 05/13/13 1053 05/14/13 0519  NA 136* 139  K 3.7 3.6*  CL 100 109  CO2 25 21  BUN 41* 40*  CREATININE 0.54 0.43*  CALCIUM 8.7 7.8*  GLUCOSE 121* 96    CBG (last 3)  No results found for this basename: GLUCAP,  in the last 72 hours  Scheduled Meds: . atorvastatin  40 mg Oral Daily  . benzonatate  100 mg Oral TID  . budesonide-formoterol  2 puff Inhalation BID  . furosemide  20 mg Intravenous Once  . ipratropium-albuterol  3 mL Nebulization Q6H  . nicotine  14 mg Transdermal Daily  . [START ON 05/17/2013] pantoprazole (PROTONIX) IV  40 mg Intravenous Q12H  . sodium chloride  3 mL Intravenous Q12H    Continuous Infusions: . pantoprozole (PROTONIX) infusion 8 mg/hr (05/14/13 1031)    Past Medical History  Diagnosis Date  . Hypertension   . Hyperlipidemia   . ALLERGIC RHINITIS   . Bronchitis   . COPD (chronic obstructive pulmonary disease)     Past Surgical History  Procedure Laterality Date  . Cesarean section    .  Carotid endarterectomy Right   . Laparotomy N/A 04/25/2013    Procedure: EXPLORATORY LAPAROTOMY;  Surgeon: Jamesetta So, MD;  Location: [facility];  Service: General;  Laterality: N/A;  . Gastrorrhaphy N/A 04/25/2013    Procedure: Toniann Ket;  Surgeon: Jamesetta So, MD;  Location: [facility];  Service: General;  Laterality: N/A;    Colman Cater MS,RD,CSG,LDN Office: [phone] Pager: [phone]

## 2013-05-14 NOTE — Progress Notes (Signed)
CRITICAL VALUE ALERT  Critical value received:  Hgb 6.3  Date of notification: 05/14/2013  Time of notification:  0620  Critical value read back:yes  Nurse who received alert:  Deeann Dowse  MD notified (1st page): Dr. Darrick Meigs  Time of first page: 0625  MD notified (2nd page):   Time of second page:  Responding MD:   Time MD responded:

## 2013-05-14 NOTE — Consult Note (Addendum)
Referring Provider: Dr. Roderic Palau Primary Care Physician:  Sherrie Mustache, MD Primary Gastroenterologist:  Dr. Gala Romney   Date of Admission: 05/13/13 Date of Consultation: 05/14/13  Reason for Consultation:  Symptomatic anemia, melena  HPI:  Jackie Walls is a 68 year old female who presented to the ED with abdominal pain and nausea on 4/21. CT scan revealed pneumoperitoneum. She was taken emergently to the OR by Dr. Arnoldo Morale, where an exploratory laparotomy was performed revealing anterior gastric perforation. Portion of transverse colon noted to have sealed over gastric perforation.  She was discharged in stable condition on 4/29. During prior hospitalization, she received blood transfusions due to acute on chronic anemia. At discharge, Hgb was 9.9. Presented again on 5/10 with complaints of SOB, heme positive stool, Hbg 6.2, BUN 41. Received 2 units PRBCs 05/13/09. Hgb improved to 7.7 but now again at 6.3. An additional 2 units have been ordered.   States she presented to the ED due to worsening shortness of breath, headache. No abdominal pain. Multiple episodes of melena. States she continues to take 1 Advil every 4 hours. On Plavix as outpatient. No aspirin powders since exploratory laparotomy. "used to take them but quit". Feels fatigued. Notes poor appetite for last 2 weeks. Friend brought her ensure but couldn't drink. Vomited. Couldn't keep anything on her stomach yesterday. No GERD, dysphagia.   Thinks she may have had a colonoscopy then states "I don't know". Last EGD "a long time ago".    Past Medical History  Diagnosis Date  . Hypertension   . Hyperlipidemia   . ALLERGIC RHINITIS   . Bronchitis   . COPD (chronic obstructive pulmonary disease)     Past Surgical History  Procedure Laterality Date  . Cesarean section    . Carotid endarterectomy Right   . Laparotomy N/A 04/25/2013    Procedure: EXPLORATORY LAPAROTOMY;  Surgeon: Jamesetta So, MD;  Location: [facility];   Service: General;  Laterality: N/A;  . Gastrorrhaphy N/A 04/25/2013    Procedure: Toniann Ket;  Surgeon: Jamesetta So, MD;  Location: [facility];  Service: General;  Laterality: N/A;    Prior to Admission medications   Medication Sig Start Date End Date Taking? Authorizing Provider  amLODipine (NORVASC) 5 MG tablet Take 5 mg by mouth every morning.    Yes Historical Provider, MD  atorvastatin (LIPITOR) 40 MG tablet Take 40 mg by mouth daily.   Yes Historical Provider, MD  B Complex-C-Min-Fe-FA (HEMATINIC PLUS VIT/MINERALS PO) Take 1 tablet by mouth 2 (two) times daily.    Yes Historical Provider, MD  budesonide-formoterol (SYMBICORT) 160-4.5 MCG/ACT inhaler Inhale 2 puffs into the lungs 2 (two) times daily.     Yes Historical Provider, MD  calcium-vitamin D (OSCAL WITH D) 500-200 MG-UNIT per tablet Take 1 tablet by mouth 2 (two) times daily.   Yes Historical Provider, MD  cetirizine (ZYRTEC) 10 MG tablet Take 10 mg by mouth every morning.    Yes Historical Provider, MD  cholecalciferol (VITAMIN D) 1000 UNITS tablet Take 1,000 Units by mouth every evening.   Yes Historical Provider, MD  clopidogrel (PLAVIX) 75 MG tablet Take 75 mg by mouth daily with breakfast.   Yes Historical Provider, MD  doxazosin (CARDURA) 4 MG tablet Take 4 mg by mouth every morning.    Yes Historical Provider, MD  Fluticasone Furoate-Vilanterol (BREO ELLIPTA) 100-25 MCG/INH AEPB Inhale 1 Inhaler into the lungs daily.   Yes Historical Provider, MD  losartan (COZAAR) 50 MG tablet Take 100 mg by  mouth every morning.    Yes Historical Provider, MD  Multiple Vitamin (MULTIVITAMIN WITH MINERALS) TABS tablet Take 1 tablet by mouth daily.   Yes Historical Provider, MD  alendronate (FOSAMAX) 70 MG tablet Take 70 mg by mouth once a week. Take with a full glass of water on an empty stomach.    Historical Provider, MD  omeprazole (PRILOSEC) 20 MG capsule Take 1 capsule (20 mg total) by mouth 2 (two) times daily. 05/02/13   Jamesetta So, MD    Current Facility-Administered Medications  Medication Dose Route Frequency Provider Last Rate Last Dose  . 0.9 %  sodium chloride infusion   Intravenous Continuous Kathie Dike, MD 50 mL/hr at 05/13/13 1845 50 mL/hr at 05/13/13 1845  . acetaminophen (TYLENOL) tablet 650 mg  650 mg Oral Q6H PRN Kathie Dike, MD   650 mg at 05/13/13 2133   Or  . acetaminophen (TYLENOL) suppository 650 mg  650 mg Rectal Q6H PRN Kathie Dike, MD      . acetaminophen (TYLENOL) tablet 650 mg  650 mg Oral Once Eugenie Filler, MD      . albuterol (PROVENTIL) (2.5 MG/3ML) 0.083% nebulizer solution 2.5 mg  2.5 mg Nebulization Q2H PRN Kathie Dike, MD      . atorvastatin (LIPITOR) tablet 40 mg  40 mg Oral Daily Kathie Dike, MD   40 mg at 05/13/13 1844  . budesonide-formoterol (SYMBICORT) 160-4.5 MCG/ACT inhaler 2 puff  2 puff Inhalation BID Kathie Dike, MD   2 puff at 05/14/13 0741  . diphenhydrAMINE (BENADRYL) capsule 25 mg  25 mg Oral Once Eugenie Filler, MD      . furosemide (LASIX) injection 20 mg  20 mg Intravenous Once Eugenie Filler, MD      . ipratropium-albuterol (DUONEB) 0.5-2.5 (3) MG/3ML nebulizer solution 3 mL  3 mL Nebulization Q6H Kathie Dike, MD   3 mL at 05/14/13 0741  . ondansetron (ZOFRAN) tablet 4 mg  4 mg Oral Q6H PRN Kathie Dike, MD       Or  . ondansetron (ZOFRAN) injection 4 mg  4 mg Intravenous Q6H PRN Kathie Dike, MD      . pantoprazole (PROTONIX) 80 mg in sodium chloride 0.9 % 250 mL infusion  8 mg/hr Intravenous Continuous Eugenie Filler, MD      . Derrill Memo ON 05/17/2013] pantoprazole (PROTONIX) injection 40 mg  40 mg Intravenous Q12H Eugenie Filler, MD      . sodium chloride 0.9 % injection 3 mL  3 mL Intravenous Q12H Kathie Dike, MD        Allergies as of 05/13/2013  . (No Known Allergies)    Family History  Problem Relation Age of Onset  . Cancer Sister   . Colon cancer Neg Hx     History   Social History  . Marital Status:  Married    Spouse Name: N/A    Number of Children: N/A  . Years of Education: N/A   Occupational History  . Not on file.   Social History Main Topics  . Smoking status: Current Every Day Smoker -- 0.10 packs/day    Types: Cigarettes    Start date: 01/05/1948  . Smokeless tobacco: Not on file  . Alcohol Use: No  . Drug Use: No  . Sexual Activity: Not on file   Other Topics Concern  . Not on file   Social History Narrative   Lives with her husband at home.    Review  of Systems: Gen: see HPI CV: Denies chest pain, heart palpitations, syncope, edema  Resp: see HPI GI: see HPI GU : Denies urinary burning, urinary frequency, urinary incontinence.  MS: Denies joint pain,swelling, cramping Derm: Denies rash, itching, dry skin Psych: Denies depression, anxiety,confusion, or memory loss .  Physical Exam: Vital signs in last 24 hours: Temp:  [97.7 F (36.5 C)-99.3 F (37.4 C)] 98.1 F (36.7 C) (05/11 0332) Pulse Rate:  [63-112] 83 (05/11 0832) Resp:  [18-23] 20 (05/11 0332) BP: (119-172)/(61-88) 145/72 mmHg (05/11 0832) SpO2:  [79 %-100 %] 99 % (05/11 0741) Weight:  [114 lb 13.8 oz (52.1 kg)-126 lb (57.153 kg)] 114 lb 13.8 oz (52.1 kg) (05/10 1708) Last BM Date: 05/13/13 General:   Alert, pale, appears older than stated age Head:  Normocephalic and atraumatic. Eyes:  Sclera clear, no icterus.   Conjunctiva pink. Ears:  HOH Nose:  No deformity, discharge,  or lesions. Mouth:  No deformity or lesions, oral mucosa moist Neck:  Supple; no masses or thyromegaly. Lungs:  Clear to auscultation bilaterally  Heart:  S1 S2 present, no murmurs  Abdomen:  Soft, nontender and nondistended. Midline staples intact. +BS. No rebound or guarding.  Rectal:  Deferred    Extremities:  Without edema. Neurologic:  Alert and  oriented x4 Skin:  Intact without significant lesions or rashes. Cervical Nodes:  No significant cervical adenopathy. Psych:  Alert and cooperative. Normal mood and  affect.  Intake/Output from previous day: 05/10 0701 - 05/11 0700 In: 947.5 [I.V.:585; Blood:362.5] Out: 200 [Urine:200] Intake/Output this shift:    Lab Results:  Recent Labs  05/13/13 1053 05/13/13 2140 05/14/13 0519  WBC 9.4 5.3 6.9  HGB 6.2* 7.7* 6.3*  HCT 18.2* 21.9* 18.0*  PLT 398 294 281   BMET  Recent Labs  05/13/13 1053 05/14/13 0519  NA 136* 139  K 3.7 3.6*  CL 100 109  CO2 25 21  GLUCOSE 121* 96  BUN 41* 40*  CREATININE 0.54 0.43*  CALCIUM 8.7 7.8*   LFT  Recent Labs  05/13/13 1053  PROT 5.1*  ALBUMIN 2.4*  AST 18  ALT 16  ALKPHOS 100  BILITOT <0.2*   PT/INR  Recent Labs  05/13/13 1053  LABPROT 14.2  INR 1.12    Studies/Results: Dg Chest Portable 1 View  05/13/2013   CLINICAL DATA:  Shortness of breath, loss of appetite, weakness  EXAM: PORTABLE CHEST - 1 VIEW  COMPARISON:  DG CHEST 1V PORT dated 04/25/2013; DG CHEST 1V PORT dated 03/04/2013; DG CHEST 1V dated 01/31/2013  FINDINGS: The heart size and mediastinal contours are within normal limits. Both lungs are clear. The visualized skeletal structures are unremarkable.  IMPRESSION: No active disease.   Electronically Signed   By: Skipper Cliche M.D.   On: 05/13/2013 11:09    Impression: 68 year old female with recent perforated gastric ulcer April 21, s/p exploratory laparotomy with gastrorrhaphy, discharged in stable condition but now returning with acute blood loss anemia, melena, and continued use of Advil multiple times a day. Has remained on Plavix as an outpatient. She has received 2 units PRBCs yesterday and an additional 2 units have been ordered. Most recent Hgb 6.3. She has had a 3 gram drop since discharge a few weeks ago. H.pylori serologies negative during last admission. Also known abdominal aortic aneurysm measuring 4.0 cm on CT 4/22. With continued use of NSAIDs, concern for bleeding ulceration, less likely aortoenteric fistula. Patient remains NPO.   Plan: Agree with Protonix  drip  Agree with additional 2 units PRBCs; need to keep 2 units ahead Remain NPO Continue to hold Plavix Serial H/H Anticipate EGD with Dr. Gala Romney; risks and benefits discussed with patient. Will discuss further with Dr. Gala Romney.  Orvil Feil, ANP-BC [facility]     LOS: 1 day    05/14/2013, 9:14 AM  Attending note:  Recent CT reviewed. Patient has a respectable AAA which is abutting and displacing, somewhat, the second portion of the duodenum. Prior to pursuing an EGD, we need to get a CTA just to make sure she doesn't have an aortoenteric fistula.  CTA reviewed. No aortoenteric fistula. Suspicious renal lesions will need further investigation electively. May need further vascular evaluation as well.  Discussed with Dr. Arnoldo Morale earlier today.  Patient needs an EGD. We'll plan for Dr. Oneida Alar to perform tomorrow. Agree with 2 more units of packed red blood cells transfused.

## 2013-05-15 ENCOUNTER — Encounter (HOSPITAL_COMMUNITY): Payer: Self-pay | Admitting: *Deleted

## 2013-05-15 ENCOUNTER — Encounter (HOSPITAL_COMMUNITY)
Admission: EM | Disposition: A | Discharge status: Home Health | Payer: Self-pay | Source: Home / Self Care | Attending: Internal Medicine

## 2013-05-15 DIAGNOSIS — K2970 Gastritis, unspecified, without bleeding: Secondary | ICD-10-CM | POA: Diagnosis present

## 2013-05-15 DIAGNOSIS — K264 Chronic or unspecified duodenal ulcer with hemorrhage: Secondary | ICD-10-CM | POA: Diagnosis present

## 2013-05-15 DIAGNOSIS — K2990 Gastroduodenitis, unspecified, without bleeding: Secondary | ICD-10-CM

## 2013-05-15 HISTORY — PX: ESOPHAGOGASTRODUODENOSCOPY: SHX5428

## 2013-05-15 LAB — TYPE AND SCREEN
ABO/RH(D): A POS
Antibody Screen: NEGATIVE
Unit division: 0
Unit division: 0
Unit division: 0
Unit division: 0

## 2013-05-15 LAB — CBC
HCT: 27 % — ABNORMAL LOW (ref 36.0–46.0)
HEMATOCRIT: 25.9 % — AB (ref 36.0–46.0)
Hemoglobin: 9.2 g/dL — ABNORMAL LOW (ref 12.0–15.0)
Hemoglobin: 9.7 g/dL — ABNORMAL LOW (ref 12.0–15.0)
MCH: 31.4 pg (ref 26.0–34.0)
MCH: 32 pg (ref 26.0–34.0)
MCHC: 35.5 g/dL (ref 30.0–36.0)
MCHC: 35.9 g/dL (ref 30.0–36.0)
MCV: 88.4 fL (ref 78.0–100.0)
MCV: 89.1 fL (ref 78.0–100.0)
PLATELETS: 290 10*3/uL (ref 150–400)
PLATELETS: 290 10*3/uL (ref 150–400)
RBC: 2.93 MIL/uL — ABNORMAL LOW (ref 3.87–5.11)
RBC: 3.03 MIL/uL — AB (ref 3.87–5.11)
RDW: 16.9 % — AB (ref 11.5–15.5)
RDW: 17 % — ABNORMAL HIGH (ref 11.5–15.5)
WBC: 6.4 10*3/uL (ref 4.0–10.5)
WBC: 7.7 10*3/uL (ref 4.0–10.5)

## 2013-05-15 LAB — BASIC METABOLIC PANEL WITH GFR
BUN: 17 mg/dL (ref 6–23)
CO2: 22 meq/L (ref 19–32)
Calcium: 8 mg/dL — ABNORMAL LOW (ref 8.4–10.5)
Chloride: 108 meq/L (ref 96–112)
Creatinine, Ser: 0.46 mg/dL — ABNORMAL LOW (ref 0.50–1.10)
GFR calc Af Amer: >90 mL/min
GFR calc non Af Amer: >90 mL/min
Glucose, Bld: 86 mg/dL (ref 70–99)
Potassium: 3 meq/L — ABNORMAL LOW (ref 3.7–5.3)
Sodium: 140 meq/L (ref 137–147)

## 2013-05-15 SURGERY — EGD (ESOPHAGOGASTRODUODENOSCOPY)
Anesthesia: Moderate Sedation

## 2013-05-15 MED ORDER — MEPERIDINE HCL 100 MG/ML IJ SOLN
INTRAMUSCULAR | Status: DC | PRN
  Administered 2013-05-15 (×2): 25 mg via INTRAVENOUS

## 2013-05-15 MED ORDER — BOOST PLUS PO LIQD
237.0000 mL | Freq: Three times a day (TID) | ORAL | Status: DC
  Filled 2013-05-15: qty 237

## 2013-05-15 MED ORDER — PANTOPRAZOLE SODIUM 40 MG IV SOLR: 40.0000 mg | Freq: Two times a day (BID) | INTRAVENOUS | Status: DC

## 2013-05-15 MED ORDER — MIDAZOLAM HCL 5 MG/5ML IJ SOLN
INTRAMUSCULAR | Status: AC
  Filled 2013-05-15: qty 10

## 2013-05-15 MED ORDER — LIDOCAINE VISCOUS 2 % MT SOLN
OROMUCOSAL | Status: DC | PRN
  Administered 2013-05-15: 1 via OROMUCOSAL

## 2013-05-15 MED ORDER — SODIUM CHLORIDE 0.9 % IJ SOLN
INTRAMUSCULAR | Status: DC | PRN
  Administered 2013-05-15 (×2)

## 2013-05-15 MED ORDER — HYDRALAZINE HCL 20 MG/ML IJ SOLN
INTRAMUSCULAR | Status: DC | PRN
  Administered 2013-05-15: 10 mg via INTRAVENOUS

## 2013-05-15 MED ORDER — LIDOCAINE VISCOUS 2 % MT SOLN
OROMUCOSAL | Status: AC
  Filled 2013-05-15: qty 15

## 2013-05-15 MED ORDER — EPINEPHRINE HCL 0.1 MG/ML IJ SOSY
PREFILLED_SYRINGE | INTRAMUSCULAR | Status: AC
  Filled 2013-05-15: qty 10

## 2013-05-15 MED ORDER — STERILE WATER FOR IRRIGATION IR SOLN
Status: DC | PRN
  Administered 2013-05-15: 09:00:00

## 2013-05-15 MED ORDER — SODIUM CHLORIDE 0.9 % IV SOLN
8.0000 mg/h | INTRAVENOUS | Status: AC
  Administered 2013-05-15 – 2013-05-17 (×6): 8 mg/h via INTRAVENOUS
  Filled 2013-05-15 (×8): qty 80

## 2013-05-15 MED ORDER — POTASSIUM CHLORIDE CRYS ER 20 MEQ PO TBCR
40.0000 meq | EXTENDED_RELEASE_TABLET | ORAL | Status: AC
  Administered 2013-05-15 (×2): 40 meq via ORAL
  Filled 2013-05-15 (×2): qty 2

## 2013-05-15 MED ORDER — MEPERIDINE HCL 100 MG/ML IJ SOLN
INTRAMUSCULAR | Status: AC
  Filled 2013-05-15: qty 2

## 2013-05-15 MED ORDER — MIDAZOLAM HCL 5 MG/5ML IJ SOLN
INTRAMUSCULAR | Status: DC | PRN
  Administered 2013-05-15 (×2): 2 mg via INTRAVENOUS

## 2013-05-15 MED ORDER — PANTOPRAZOLE SODIUM 40 MG IV SOLR
40.0000 mg | Freq: Two times a day (BID) | INTRAVENOUS | Status: DC
  Filled 2013-05-15: qty 40

## 2013-05-15 MED ORDER — HYDRALAZINE HCL 20 MG/ML IJ SOLN
INTRAMUSCULAR | Status: AC
  Filled 2013-05-15: qty 1

## 2013-05-15 MED ORDER — BOOST PLUS PO LIQD
237.0000 mL | Freq: Three times a day (TID) | ORAL | Status: DC
  Administered 2013-05-15 – 2013-05-18 (×8): 237 mL via ORAL
  Filled 2013-05-15 (×20): qty 237

## 2013-05-15 MED ORDER — SODIUM CHLORIDE 0.9 % IV SOLN
INTRAVENOUS | Status: DC
  Administered 2013-05-15: 08:00:00 via INTRAVENOUS

## 2013-05-15 NOTE — Evaluation (Signed)
Physical Therapy Evaluation Patient Details Name: Jackie Walls MRN: [id number] DOB: 25-Oct-1945 Today's Date: 05/15/2013   History of Present Illness  68 y.o. female who was recently discharged from the hospital after being treated for perforated gastric ulcer. She did have significant anemia during the hospitalization and required blood transfusions. Hemoglobin on discharge was noted to be 9.9. The patient was discharged to a skilled nursing facility. Unfortunately, her family was unhappy with the care the patient was receiving at that facility and decided to discharge her home. She is brought to the emergency room today with complaints of shortness of breath. Pt presents with gi bleed, anemia, and COPD.  Clinical Impression  Pt presents with dependencies in mobility and decreased activity tolerance. Pt would benefit from skilled PT to maximize mobility and independence for return home with family providing intermittent assistance.     Follow Up Recommendations Home health PT    Equipment Recommendations  None recommended by PT    Recommendations for Other Services       Precautions / Restrictions Precautions Precautions: Fall Precaution Comments: MRSA Restrictions Weight Bearing Restrictions: No      Mobility  Bed Mobility Overal bed mobility: Modified Independent Bed Mobility: Supine to Sit     Supine to sit: Modified independent (Device/Increase time)     General bed mobility comments: use of bed rail to maintain Independence.  Transfers Overall transfer level: Needs assistance Equipment used: Rolling walker (2 wheeled) Transfers: Sit to/from Stand Sit to Stand: Min guard         General transfer comment: cues for hand placement for increased safety, wide base of support  Ambulation/Gait Ambulation/Gait assistance: Min guard Ambulation Distance (Feet): 15 Feet Assistive device: Rolling walker (2 wheeled) Gait Pattern/deviations: Step-through  pattern;Decreased stride length;Wide base of support;Trunk flexed   Gait velocity interpretation: Below normal speed for age/gender    Stairs            Wheelchair Mobility    Modified Rankin (Stroke Patients Only)       Balance Overall balance assessment: Needs assistance         Standing balance support: Bilateral upper extremity supported Standing balance-Leahy Scale: Fair                               Pertinent Vitals/Pain O2 sats 99% 2 liters after gait    Home Living Family/patient expects to be discharged to:: Private residence Living Arrangements: Children Available Help at Discharge: Family Type of Home: House Home Access: Stairs to enter Entrance Stairs-Rails: None Technical brewer of Steps: 1 Home Layout: One level Home Equipment: Environmental consultant - 2 wheels      Prior Function Level of Independence: Independent with assistive device(s)               Hand Dominance        Extremity/Trunk Assessment   Upper Extremity Assessment: Defer to OT evaluation           Lower Extremity Assessment: Overall WFL for tasks assessed         Communication   Communication: No difficulties  Cognition Arousal/Alertness: Awake/alert Behavior During Therapy: WFL for tasks assessed/performed Overall Cognitive Status: Within Functional Limits for tasks assessed                      General Comments      Exercises        Assessment/Plan  PT Assessment Patient needs continued PT services  PT Diagnosis Difficulty walking;Generalized weakness   PT Problem List Decreased activity tolerance;Decreased balance;Decreased mobility;Decreased strength;Decreased knowledge of use of DME;Cardiopulmonary status limiting activity  PT Treatment Interventions DME instruction;Gait training;Functional mobility training;Therapeutic activities;Patient/family education;Balance training;Therapeutic exercise   PT Goals (Current goals can be  found in the Care Plan section) Acute Rehab PT Goals Patient Stated Goal: to walk more PT Goal Formulation: With patient Time For Goal Achievement: 05/29/13 Potential to Achieve Goals: Good    Frequency Min 3X/week   Barriers to discharge        Co-evaluation               End of Session Equipment Utilized During Treatment: Gait belt;Oxygen Activity Tolerance: Patient limited by fatigue Patient left: in bed;with call bell/phone within reach;Other (comment) (respiratory therapy in room) Nurse Communication: Mobility status         Time: 0539-7673 PT Time Calculation (min): 31 min   Charges:   PT Evaluation $Initial PT Evaluation Tier I: 1 Procedure PT Treatments $Gait Training: 8-22 mins   PT G Codes:          Jackie Walls 05/15/2013, 12:37 PM

## 2013-05-15 NOTE — H&P (Signed)
Primary Care Physician:  Sherrie Mustache, MD Primary Gastroenterologist:  Dr. Oneida Alar  Pre-Procedure History & Physical: HPI:  Jackie Walls is a 68 y.o. female here for [place].  Past Medical History  Diagnosis Date  . Hypertension   . Hyperlipidemia   . ALLERGIC RHINITIS   . Bronchitis   . COPD (chronic obstructive pulmonary disease)   . Abdominal aortic aneurysm   . Anxiety     Past Surgical History  Procedure Laterality Date  . Cesarean section    . Carotid endarterectomy Right   . Laparotomy N/A 04/25/2013    Procedure: EXPLORATORY LAPAROTOMY;  Surgeon: Jamesetta So, MD;  Location: [facility];  Service: General;  Laterality: N/A;  . Gastrorrhaphy N/A 04/25/2013    Procedure: Toniann Ket;  Surgeon: Jamesetta So, MD;  Location: [facility];  Service: General;  Laterality: N/A;    Prior to Admission medications   Medication Sig Start Date End Date Taking? Authorizing Provider  amLODipine (NORVASC) 5 MG tablet Take 5 mg by mouth every morning.    Yes Historical Provider, MD  atorvastatin (LIPITOR) 40 MG tablet Take 40 mg by mouth daily.   Yes Historical Provider, MD  B Complex-C-Min-Fe-FA (HEMATINIC PLUS VIT/MINERALS PO) Take 1 tablet by mouth 2 (two) times daily.    Yes Historical Provider, MD  budesonide-formoterol (SYMBICORT) 160-4.5 MCG/ACT inhaler Inhale 2 puffs into the lungs 2 (two) times daily.     Yes Historical Provider, MD  calcium-vitamin D (OSCAL WITH D) 500-200 MG-UNIT per tablet Take 1 tablet by mouth 2 (two) times daily.   Yes Historical Provider, MD  cetirizine (ZYRTEC) 10 MG tablet Take 10 mg by mouth every morning.    Yes Historical Provider, MD  cholecalciferol (VITAMIN D) 1000 UNITS tablet Take 1,000 Units by mouth every evening.   Yes Historical Provider, MD  clopidogrel (PLAVIX) 75 MG tablet Take 75 mg by mouth daily with breakfast.   Yes Historical Provider, MD  doxazosin (CARDURA) 4 MG tablet Take 4 mg by mouth every morning.    Yes Historical  Provider, MD  Fluticasone Furoate-Vilanterol (BREO ELLIPTA) 100-25 MCG/INH AEPB Inhale 1 Inhaler into the lungs daily.   Yes Historical Provider, MD  losartan (COZAAR) 50 MG tablet Take 100 mg by mouth every morning.    Yes Historical Provider, MD  Multiple Vitamin (MULTIVITAMIN WITH MINERALS) TABS tablet Take 1 tablet by mouth daily.   Yes Historical Provider, MD  alendronate (FOSAMAX) 70 MG tablet Take 70 mg by mouth once a week. Take with a full glass of water on an empty stomach.    Historical Provider, MD  omeprazole (PRILOSEC) 20 MG capsule Take 1 capsule (20 mg total) by mouth 2 (two) times daily. 05/02/13   Jamesetta So, MD    Allergies as of 05/13/2013  . (No Known Allergies)    Family History  Problem Relation Age of Onset  . Cancer Sister   . Colon cancer Neg Hx     History   Social History  . Marital Status: Married    Spouse Name: N/A    Number of Children: N/A  . Years of Education: N/A   Occupational History  . Not on file.   Social History Main Topics  . Smoking status: Current Every Day Smoker -- 0.10 packs/day    Types: Cigarettes    Start date: 01/05/1948  . Smokeless tobacco: Not on file  . Alcohol Use: No  . Drug Use: No  . Sexual Activity: Not on  file   Other Topics Concern  . Not on file   Social History Narrative   Lives with her husband at home.    Review of Systems: See HPI, otherwise negative ROS   Physical Exam: BP 186/90  Pulse 91  Temp(Src) 97.6 F (36.4 C) (Oral)  Resp 22  Ht 5\' 4"  (1.626 m)  Wt 114 lb 13.8 oz (52.1 kg)  BMI 19.71 kg/m2  SpO2 99% General:   Alert,  pleasant and cooperative in NAD Head:  Normocephalic and atraumatic. Neck:  Supple; Lungs:  Clear throughout to auscultation.    Heart:  Regular rate and rhythm. Abdomen:  Soft, nontender and nondistended. Normal bowel sounds, without guarding, and without rebound.   Neurologic:  Alert and  oriented x4;  grossly normal neurologically.  Impression/Plan:      MELENA  PLAN: 1. EGD TODAY

## 2013-05-15 NOTE — Op Note (Signed)
[facility] [address] [place], [address]   ENDOSCOPY PROCEDURE REPORT  PATIENT: Jackie Walls, Jackie Walls  MR#: [id number] BIRTHDATE: 06/09/1945 , 88  yrs. old GENDER: Female  ENDOSCOPIST: Barney Drain, MD REFERRED VC:[id number] Edrick Oh, M.D.  PROCEDURE DATE: 05/15/2013 PROCEDURE:   EGD w/ biopsy , EGD w/ control of bleeding , and EGD w/ directed submucosal injection(s), any substance  INDICATIONS:Melena.   PMHx: PERFORATED DUODENAL ULCER, S/P REPAIR APR 21.  CONTINUED TO USE IBU[PROFEN ON DISCHARGE. MEDICATIONS: Demerol 50 mg IV and Versed 4 mg IV TOPICAL ANESTHETIC:   Viscous Xylocaine  DESCRIPTION OF PROCEDURE:     Physical exam was performed.  Informed consent was obtained from the patient after explaining the benefits, risks, and alternatives to the procedure.  The patient was connected to the monitor and placed in the left lateral position.  Continuous oxygen was provided by nasal cannula and IV medicine administered through an indwelling cannula.  After administration of sedation, the patients esophagus was intubated and the [id number] ([id number])  endoscope was advanced under direct visualization to the second portion of the duodenum.  The scope was removed slowly by carefully examining the color, texture, anatomy, and integrity of the mucosa on the way out.  The patient was recovered in endoscopy and discharged home in satisfactory condition.   ESOPHAGUS: The mucosa of the esophagus appeared normal.   STOMACH: Moderate erosive gastritis (inflammation) was found in the entire examined stomach.  Multiple biopsies were performed using cold forceps.   DUODENUM: A single irregular shaped and clean-based ulcer ranging between 3-71mm in size was found in the duodenal bulb. A single deep ulcer ranging between 5-45mm in size with surrounding edema, a visible vessel and active oozing of blood was found in the 2nd part of the duodenum.  Bipolar (BICAP) cautery with a 7Fr  probe was applied to the site for 10 secs using 20 watts power.  Firm pressure was applied to the cautery site with complete hemostasis achieved.   DEFECT IN R LATERAL DUODENAL WALL WITH VISIBLE SUTURES.  NO BLEEDING.  COMPLICATIONS:   None  ENDOSCOPIC IMPRESSION: 1.   MELENA DUE TO BLEEDING DUODENAL ULCERS 2.   MODERTAE Erosive gastritis 3.   ONE CLEAN-BASED DUODENAL ULCER 4.   NORMAL gastrorrhaphy SITE  RECOMMENDATIONS: PT SHOULD AVOID ASA/NSAIDS FOREVER. DISCUSSED WITH PT RPRIOR TO SEDATION. AFTER 1 MONTH, MAY USE ASA IF CLINICALLY INDICATED. PROTNIX GTT FOR 72 HRS THEN PPI BID FOR 3 MOS THEN ONCE DAILY FOREVER AWAIT BIOPSY FULL LIQUID DIET BOOST TID BM   REPEAT EXAM:   _______________________________ Lorrin MaisBarney Drain, MD 05/15/2013 9:26 AM       PATIENT NAME:  Jackie Walls MR#: [id number]

## 2013-05-15 NOTE — Progress Notes (Signed)
[facility] PROGRESS NOTE  Jackie Walls FIE:[id number] DOB: June 07, 1945 DOA: 05/13/2013 PCP: Sherrie Mustache, MD  Assessment/Plan: #1 upper GI bleed/Bleeding duodenal ulcer and gastritis Secondary to bleeding duodenal ulcer and gastritis noted on upper endoscopy 05/15/2013. Patient was recently hospitalized and treated for perforated gastric ulcer from 4/21 to 05/02/2013. H. pylori serologies from last hospitalization was negative. Patient's hemoglobin on admission was 6.2. Patient status post 4 units packed red blood cells and hemoglobin currently at 9.2 this morning. Continue Protonix drip for next 72 hours and then PPI twice a day for 3 months and then once a daily indefinitely. Biopsies pending. No more NSAIDS/ASA forever. Serial CBCs. GI ff and appreciate input and rxcs.   #2 acute blood loss anemia/symptomatic anemia Secondary to problem #1. Patient on admission had a hemoglobin of 6.2. Patient's last hemoglobin from her last hospitalization was 9.9 on 04/30/2013. Patient is status post 4 units packed red blood cells and repeat hemoglobin is currently at 9.2.  Follow H&H. Transfusion threshold hemoglobin less than 7 or active bleeding.  #3 COPD and chronic respiratory failure Stable. Continue bronchodilators. Continue oxygen, Tessalon Perles. Follow.  #4 hypertension Resume home BP meds. Follow.  #5 history of CVA status post right carotid endarterectomy Patient was on Plavix for probable secondary stroke prevention. Plavix has been held secondary to problem #1. GI to determine when plavix may be resumed.  #6 tobacco abuse Tobacco cessation. Continue nicotine patch.  #7 hypokalemia Replete.  #8 prophylaxis PPI for GI prophylaxis. SCDs for DVT prophylaxis.  Code Status: Full Family Communication: Updated patient , husband and son at bedside. Disposition Plan: : Home vs SNF when medically stable.   Consultants:  Gastroenterology: Dr. Gala Romney  05/14/2013  Procedures:  Chest x-ray 05/13/2013   4 units packed red blood cells 05/10-5/11/2013  Upper endoscopy 05/15/2013-per Dr. Barney Drain  CT angiogram abdomen and pelvis 05/14/2013  Antibiotics:  None  HPI/Subjective: Patient states has not had any further blackish/greenish loose stools this morning. Patient just returned from upper endoscopy.  Objective: Filed Vitals:   05/15/13 1010  BP: 144/70  Pulse: 83  Temp: 97.7 F (36.5 C)  Resp: 14    Intake/Output Summary (Last 24 hours) at 05/15/13 1015 Last data filed at 05/15/13 1660  Gross per 24 hour  Intake   1133 ml  Output   1650 ml  Net   -517 ml   Filed Weights   05/13/13 1031 05/13/13 1708  Weight: 57.153 kg (126 lb) 52.1 kg (114 lb 13.8 oz)    Exam:   General:  NAD  Cardiovascular: RRR  Respiratory: No crackles.  Abdomen: Soft, nontender, nondistended, positive bowel sounds. Incision site looks clean dry and intact.  Musculoskeletal: No clubbing cyanosis or edema.   Data Reviewed: Basic Metabolic Panel:  Recent Labs Lab 05/13/13 1053 05/14/13 0519 05/15/13 0523  NA 136* 139 140  K 3.7 3.6* 3.0*  CL 100 109 108  CO2 25 21 22   GLUCOSE 121* 96 86  BUN 41* 40* 17  CREATININE 0.54 0.43* 0.46*  CALCIUM 8.7 7.8* 8.0*   Liver Function Tests:  Recent Labs Lab 05/13/13 1053  AST 18  ALT 16  ALKPHOS 100  BILITOT <0.2*  PROT 5.1*  ALBUMIN 2.4*   No results found for this basename: LIPASE, AMYLASE,  in the last 168 hours No results found for this basename: AMMONIA,  in the last 168 hours CBC:  Recent Labs Lab 05/13/13 1053 05/13/13 2140 05/14/13 0519 05/14/13 2135 05/15/13 6301  WBC 9.4 5.3 6.9  --  6.4  NEUTROABS 8.0*  --   --   --   --   HGB 6.2* 7.7* 6.3* 9.7* 9.2*  HCT 18.2* 21.9* 18.0* 27.1* 25.9*  MCV 91.5 86.6 86.5  --  88.4  PLT 398 294 281  --  290   Cardiac Enzymes:  Recent Labs Lab 05/13/13 1053  TROPONINI <0.30   BNP (last 3 results)  Recent  Labs  05/13/13 1053  PROBNP 847.5*   CBG: No results found for this basename: GLUCAP,  in the last 168 hours  No results found for this or any previous visit (from the past 240 hour(s)).   Studies: Dg Chest Portable 1 View  05/13/2013   CLINICAL DATA:  Shortness of breath, loss of appetite, weakness  EXAM: PORTABLE CHEST - 1 VIEW  COMPARISON:  DG CHEST 1V PORT dated 04/25/2013; DG CHEST 1V PORT dated 03/04/2013; DG CHEST 1V dated 01/31/2013  FINDINGS: The heart size and mediastinal contours are within normal limits. Both lungs are clear. The visualized skeletal structures are unremarkable.  IMPRESSION: No active disease.   Electronically Signed   By: Skipper Cliche M.D.   On: 05/13/2013 11:09   Ct Angio Abd/pel W/ And/or W/o  05/14/2013   CLINICAL DATA:  Abdominal aortic aneurysm with melena. Evaluate for an aorta-enteric fistula.  EXAM: CT ANGIOGRAPHY ABDOMEN AND PELVIS  TECHNIQUE: Multidetector CT imaging of the abdomen and pelvis was performed using the standard protocol during bolus administration of intravenous contrast. Multiplanar reconstructed images including MIPs were obtained and reviewed to evaluate the vascular anatomy.  CONTRAST:  100 mL Omnipaque 350  COMPARISON:  04/25/2013 and 10/13/2012  FINDINGS: ARTERIAL FINDINGS:  Aorta: The infrarenal abdominal aortic aneurysm measures up to 4 cm and stable. There is circumferential mural thrombus in the aortic aneurysm which is similar to the previous examination. The duodenum is draped over the abdominal aortic aneurysm but there is no evidence for arterial flow within the duodenum or a fistula.  Celiac axis: Dilatation of the proximal celiac artery measuring up to 1.0 cm. The splenic artery is heavily calcified without significant dilatation. There is a critical stenosis involving the origin of celiac trunk with calcified plaque.  Superior mesenteric: Ectasia of the proximal SMA, measuring up to 1.0 cm. Mild narrowing of the proximal SMA just  beyond the origin.  Left renal: There are 3 left renal arteries. Critical stenosis involving the most posterior of the left renal arteries.  Right renal: The origin of the right renal artery is heavily calcified and suspect at least moderate stenosis.  Inferior mesenteric: Origin of the IMA is occluded but there is distal reconstitution.  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 There is flow in the distal left common iliac artery, measuring up to 1.7 cm, and unchanged. There is flow in the left external and internal iliac arteries. Atherosclerotic disease in left external iliac artery and left common femoral artery. There is flow in the proximal left femoral arteries.  Right iliac: Again noted is an enlarged right lumbar artery which is likely providing collateral flow. There is high-grade stenosis involving the proximal right common iliac artery which is unchanged. 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 There is flow in the right external and internal iliac arteries with atherosclerotic disease. Flow in the proximal right femoral arteries.  Venous findings: The portal venous system and IVC are patent.  Flow in the renal veins.  Review of the MIP images confirms the above findings.  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 Lung bases are clear without pleural fluid. No evidence for free air. There is a suspicious exophytic lesion in the left kidney upper pole measuring 0.8 cm and minimally changed from 10/13/2012. This is a complex left renal lesion which is indeterminate. Hounsfield units roughly measure 67 on this postcontrast examination. There is another complex lesion in the posterior left kidney mid pole. Again noted is mild atrophy in the left kidney with a few renal cortical cysts. Stable low-density structure in the right kidney cortex is suggestive for a cortical  cyst. No gross abnormality to the adrenal glands, liver, gallbladder, pancreas or spleen. No significant free fluid or lymphadenopathy. Fluid in the urinary bladder. No gross abnormality to the uterus. No gross abnormality to the small or large bowel. There are old bilateral pubic rami fractures. There are bilateral sacral fractures with diffuse sclerosis. Increased sclerosis on the right side of the sacrum compared to the recent comparison examination.  IMPRESSION: The abdominal aortic aneurysm is stable, measuring up to 4 cm. No evidence for an aortic-enteric fistula.  There are two indeterminate complex lesions involving the left kidney. Small renal cell carcinomas cannot be excluded. The left renal lesions could be better characterized with an MRI (pre and post contrast).  Severe atherosclerotic disease throughout the aorta, visceral arteries and iliac arteries.  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  Old fractures involving the bilateral pubic rami and both sides of the sacrum.  These results will be called to the ordering clinician or representative by the Radiologist Assistant, and communication documented in the PACS Dashboard.   Electronically Signed   By: Markus Daft M.D.   On: 05/14/2013 15:27    Scheduled Meds: . atorvastatin  40 mg Oral Daily  . benzonatate  100 mg Oral TID  . budesonide-formoterol  2 puff Inhalation BID  . EPINEPHrine      . feeding supplement (RESOURCE BREEZE)  1 Container Oral BID BM  . hydrALAZINE      . ipratropium-albuterol  3 mL Nebulization Q6H  . lidocaine      . meperidine      . midazolam      . nicotine  14 mg Transdermal Daily  . [START ON 05/17/2013] pantoprazole (PROTONIX) IV  40 mg Intravenous Q12H  . potassium chloride  40  mEq Oral Q4H  . sodium chloride  3 mL Intravenous Q12H   Continuous Infusions: . pantoprozole (PROTONIX) infusion 8 mg/hr (05/15/13 7939)    Principal Problem:   Upper GI bleeding Active Problems:   Acute blood loss anemia   Bleeding duodenal ulcer   COPD (chronic obstructive pulmonary disease)   Hyperlipidemia   HTN (hypertension)   Protein-calorie malnutrition, severe   Symptomatic anemia   Heme positive stool   Chronic respiratory failure   Gastritis    Time spent: 35 minutes    Eugenie Filler M.D. [facility] Pager [phone]. If 7PM-7AM, please contact night-coverage at [email or web address], password [facility] 05/15/2013, 10:15 AM  LOS: 2 days

## 2013-05-16 LAB — BASIC METABOLIC PANEL
BUN: 14 mg/dL (ref 6–23)
CHLORIDE: 111 meq/L (ref 96–112)
CO2: 22 meq/L (ref 19–32)
Calcium: 8.4 mg/dL (ref 8.4–10.5)
Creatinine, Ser: 0.44 mg/dL — ABNORMAL LOW (ref 0.50–1.10)
GFR calc non Af Amer: >90 mL/min (ref >90)
Glucose, Bld: 95 mg/dL (ref 70–99)
Potassium: 4 mEq/L (ref 3.7–5.3)
Sodium: 141 mEq/L (ref 137–147)

## 2013-05-16 LAB — CBC
HEMATOCRIT: 26.6 % — AB (ref 36.0–46.0)
Hemoglobin: 9.3 g/dL — ABNORMAL LOW (ref 12.0–15.0)
MCH: 31.6 pg (ref 26.0–34.0)
MCHC: 35 g/dL (ref 30.0–36.0)
MCV: 90.5 fL (ref 78.0–100.0)
PLATELETS: 299 10*3/uL (ref 150–400)
RBC: 2.94 MIL/uL — AB (ref 3.87–5.11)
RDW: 17.5 % — AB (ref 11.5–15.5)
WBC: 7.6 10*3/uL (ref 4.0–10.5)

## 2013-05-16 MED ORDER — AMLODIPINE BESYLATE 5 MG PO TABS
5.0000 mg | ORAL_TABLET | Freq: Every morning | ORAL | Status: DC
  Administered 2013-05-16 – 2013-05-18 (×3): 5 mg via ORAL
  Filled 2013-05-16 (×3): qty 1

## 2013-05-16 NOTE — Progress Notes (Signed)
Subjective: Reports abdominal discomfort at incision. No melena. No N/V.   Objective: Vital signs in last 24 hours: Temp:  [97.6 F (36.4 C)-98.2 F (36.8 C)] 97.6 F (36.4 C) (05/13 0434) Pulse Rate:  [78-98] 78 (05/13 0434) Resp:  [12-43] 18 (05/13 0434) BP: (141-206)/(61-90) 165/76 mmHg (05/13 0434) SpO2:  [96 %-100 %] 97 % (05/13 0434) FiO2 (%):  [98 %] 98 % (05/12 0837) Last BM Date: 05/15/13 General:   Alert and oriented, pleasant Head:  Normocephalic and atraumatic. Eyes:  No icterus, sclera clear. Conjuctiva pink.  Abdomen:  Bowel sounds present, soft, non-tender, non-distended. No HSM or hernias noted. Midline staples intact. Sitting in chair.  Neurologic:  Alert and  oriented x4;  grossly normal neurologically. Skin:  Warm and dry, intact without significant lesions.  Psych:  Alert and cooperative. Normal mood and affect.  Intake/Output from previous day: 05/12 0701 - 05/13 0700 In: 783 [P.O.:480; I.V.:303] Out: 500 [Urine:500] Intake/Output this shift:    Lab Results:  Recent Labs  05/15/13 0523 05/15/13 1620 05/16/13 0500  WBC 6.4 7.7 7.6  HGB 9.2* 9.7* 9.3*  HCT 25.9* 27.0* 26.6*  PLT 290 290 299   BMET  Recent Labs  05/14/13 0519 05/15/13 0523 05/16/13 0500  NA 139 140 141  K 3.6* 3.0* 4.0  CL 109 108 111  CO2 21 22 22   GLUCOSE 96 86 95  BUN 40* 17 14  CREATININE 0.43* 0.46* 0.44*  CALCIUM 7.8* 8.0* 8.4   LFT  Recent Labs  05/13/13 1053  PROT 5.1*  ALBUMIN 2.4*  AST 18  ALT 16  ALKPHOS 100  BILITOT <0.2*   PT/INR  Recent Labs  05/13/13 1053  LABPROT 14.2  INR 1.12     Studies/Results: Ct Angio Abd/pel W/ And/or W/o  05/14/2013   CLINICAL DATA:  Abdominal aortic aneurysm with melena. Evaluate for an aorta-enteric fistula.  EXAM: CT ANGIOGRAPHY ABDOMEN AND PELVIS  TECHNIQUE: Multidetector CT imaging of the abdomen and pelvis was performed using the standard protocol during bolus administration of intravenous contrast.  Multiplanar reconstructed images including MIPs were obtained and reviewed to evaluate the vascular anatomy.  CONTRAST:  100 mL Omnipaque 350  COMPARISON:  04/25/2013 and 10/13/2012  FINDINGS: ARTERIAL FINDINGS:  Aorta: The infrarenal abdominal aortic aneurysm measures up to 4 cm and stable. There is circumferential mural thrombus in the aortic aneurysm which is similar to the previous examination. The duodenum is draped over the abdominal aortic aneurysm but there is no evidence for arterial flow within the duodenum or a fistula.  Celiac axis: Dilatation of the proximal celiac artery measuring up to 1.0 cm. The splenic artery is heavily calcified without significant dilatation. There is a critical stenosis involving the origin of celiac trunk with calcified plaque.  Superior mesenteric: Ectasia of the proximal SMA, measuring up to 1.0 cm. Mild narrowing of the proximal SMA just beyond the origin.  Left renal: There are 3 left renal arteries. Critical stenosis involving the most posterior of the left renal arteries.  Right renal: The origin of the right renal artery is heavily calcified and suspect at least moderate stenosis.  Inferior mesenteric: Origin of the IMA is occluded but there is distal reconstitution.  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 There is flow in the distal left common iliac artery, measuring up to 1.7 cm, and unchanged. There is flow in the left external and internal iliac arteries. Atherosclerotic disease in left external  iliac artery and left common femoral artery. There is flow in the proximal left femoral arteries.  Right iliac: Again noted is an enlarged right lumbar artery which is likely providing collateral flow. There is high-grade stenosis involving the proximal right common iliac artery which is unchanged. 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 There is flow in the right external and internal  iliac arteries with atherosclerotic disease. Flow in the proximal right femoral arteries.  Venous findings: The portal venous system and IVC are patent. Flow in the renal veins.  Review of the MIP images confirms the above findings.  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 Lung bases are clear without pleural fluid. No evidence for free air. There is a suspicious exophytic lesion in the left kidney upper pole measuring 0.8 cm and minimally changed from 10/13/2012. This is a complex left renal lesion which is indeterminate. Hounsfield units roughly measure 67 on this postcontrast examination. There is another complex lesion in the posterior left kidney mid pole. Again noted is mild atrophy in the left kidney with a few renal cortical cysts. Stable low-density structure in the right kidney cortex is suggestive for a cortical cyst. No gross abnormality to the adrenal glands, liver, gallbladder, pancreas or spleen. No significant free fluid or lymphadenopathy. Fluid in the urinary bladder. No gross abnormality to the uterus. No gross abnormality to the small or large bowel. There are old bilateral pubic rami fractures. There are bilateral sacral fractures with diffuse sclerosis. Increased sclerosis on the right side of the sacrum compared to the recent comparison examination.  IMPRESSION: The abdominal aortic aneurysm is stable, measuring up to 4 cm. No evidence for an aortic-enteric fistula.  There are two indeterminate complex lesions involving the left kidney. Small renal cell carcinomas cannot be excluded. The left renal lesions could be better characterized with an MRI (pre and post contrast).  Severe atherosclerotic disease throughout the aorta, visceral arteries and iliac arteries.  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  Old fractures involving the bilateral pubic rami and both sides of the sacrum.  These results will be called to the ordering clinician or representative by the Radiologist Assistant, and communication documented in the PACS Dashboard.   Electronically Signed   By: Markus Daft M.D.   On: 05/14/2013 15:27    Assessment: 68 year old female admitted with melena and acute on chronic anemia, with past history of perforated duodenal ulcer s/p laparotomy on 4/21. EGD with duodenal ulceration, visible vessel with active oozing of blood in 2nd portion of duodenum, s/p cautery. 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 Clinically improved, Hgb remaining stable, no further melena.   As of note, H.pylori serology negative April 2015.   Plan: Protonix drip for a total of 72 hours (end date 5/15) then PPI BID for 3 months, then daily forever Await biopsy Full liquids, advance as tolerated to soft diet Boost TID Absolute avoidance of NSAIDs/ASA products forever  Orvil Feil, ANP-BC [facility]     LOS: 3 days    05/16/2013, 7:46 AM

## 2013-05-16 NOTE — Progress Notes (Signed)
Events during admission are noted. On day of discharge, I will remove the patient's staples from her incision.

## 2013-05-16 NOTE — Progress Notes (Signed)
[facility] PROGRESS NOTE  Jackie Walls ZDG:[id number] DOB: 03/09/1945 DOA: 05/13/2013 PCP: Sherrie Mustache, MD  Assessment/Plan: 1- upper GI bleed/Bleeding duodenal ulcer and gastritis Secondary to bleeding duodenal ulcer and gastritis noted on upper endoscopy 05/15/2013. Patient was recently hospitalized and treated for perforated gastric ulcer from 4/21 to 05/02/2013. H. pylori serologies from last hospitalization was negative. Patient's hemoglobin on admission was 6.2. Patient status post 4 units packed red blood cells and hemoglobin currently at 9.2.  Continue Protonix drip for next 72 hours until 5-15.  and then PPI twice a day for 3 months and then once a daily indefinitely. Biopsies pending. No more NSAIDS/ASA forever. Serial CBCs. GI ff and appreciate input and rxcs.   -2 acute blood loss anemia/symptomatic anemia Secondary to problem #1. Patient on admission had a hemoglobin of 6.2. Patient's last hemoglobin from her last hospitalization was 9.9 on 04/30/2013. Patient is status post 4 units packed red blood cells and repeat hemoglobin is currently at 9.2.  Follow H&H. Transfusion threshold hemoglobin less than 7 or active bleeding.  3 COPD and chronic respiratory failure Stable. Continue bronchodilators. Continue oxygen, Tessalon Perles. Follow.  #4 hypertension Resume Norvasc.   #5 history of CVA status post right carotid endarterectomy Patient was on Plavix for probable secondary stroke prevention. Plavix has been held secondary to problem #1. GI to determine when plavix may be resumed.  #6 tobacco abuse Tobacco cessation. Continue nicotine patch.  #7 hypokalemia Replete.  #8 prophylaxis PPI for GI prophylaxis. SCDs for DVT prophylaxis.  Code Status: Full Family Communication: Updated patient , husband and son at bedside. Disposition Plan: : Home vs SNF when medically stable.   Consultants:  Gastroenterology: Dr. Gala Romney  05/14/2013  Procedures:  Chest x-ray 05/13/2013   4 units packed red blood cells 05/10-5/11/2013  Upper endoscopy 05/15/2013-per Dr. Barney Drain  CT angiogram abdomen and pelvis 05/14/2013  Antibiotics:  None  HPI/Subjective: No more melena. She wants to go home tomorrow.   Objective: Filed Vitals:   05/16/13 1339  BP: 160/69  Pulse: 92  Temp: 98 F (36.7 C)  Resp: 18    Intake/Output Summary (Last 24 hours) at 05/16/13 1642 Last data filed at 05/16/13 1536  Gross per 24 hour  Intake   1503 ml  Output    500 ml  Net   1003 ml   Filed Weights   05/13/13 1031 05/13/13 1708  Weight: 57.153 kg (126 lb) 52.1 kg (114 lb 13.8 oz)    Exam:   General:  NAD  Cardiovascular: RRR  Respiratory: No crackles.  Abdomen: Soft, nontender, nondistended, positive bowel sounds. Incision site looks clean dry and intact.  Musculoskeletal: No clubbing cyanosis or edema.   Data Reviewed: Basic Metabolic Panel:  Recent Labs Lab 05/13/13 1053 05/14/13 0519 05/15/13 0523 05/16/13 0500  NA 136* 139 140 141  K 3.7 3.6* 3.0* 4.0  CL 100 109 108 111  CO2 25 21 22 22   GLUCOSE 121* 96 86 95  BUN 41* 40* 17 14  CREATININE 0.54 0.43* 0.46* 0.44*  CALCIUM 8.7 7.8* 8.0* 8.4   Liver Function Tests:  Recent Labs Lab 05/13/13 1053  AST 18  ALT 16  ALKPHOS 100  BILITOT <0.2*  PROT 5.1*  ALBUMIN 2.4*   No results found for this basename: LIPASE, AMYLASE,  in the last 168 hours No results found for this basename: AMMONIA,  in the last 168 hours CBC:  Recent Labs Lab 05/13/13 1053 05/13/13 2140 05/14/13 0519 05/14/13  2135 05/15/13 0523 05/15/13 1620 05/16/13 0500  WBC 9.4 5.3 6.9  --  6.4 7.7 7.6  NEUTROABS 8.0*  --   --   --   --   --   --   HGB 6.2* 7.7* 6.3* 9.7* 9.2* 9.7* 9.3*  HCT 18.2* 21.9* 18.0* 27.1* 25.9* 27.0* 26.6*  MCV 91.5 86.6 86.5  --  88.4 89.1 90.5  PLT 398 294 281  --  290 290 299   Cardiac Enzymes:  Recent Labs Lab 05/13/13 1053   TROPONINI <0.30   BNP (last 3 results)  Recent Labs  05/13/13 1053  PROBNP 847.5*   CBG: No results found for this basename: GLUCAP,  in the last 168 hours  No results found for this or any previous visit (from the past 240 hour(s)).   Studies: No results found.  Scheduled Meds: . amLODipine  5 mg Oral q morning - 10a  . atorvastatin  40 mg Oral Daily  . benzonatate  100 mg Oral TID  . budesonide-formoterol  2 puff Inhalation BID  . feeding supplement (RESOURCE BREEZE)  1 Container Oral BID BM  . ipratropium-albuterol  3 mL Nebulization Q6H  . lactose free nutrition  237 mL Oral TID BM  . nicotine  14 mg Transdermal Daily  . [START ON 05/18/2013] pantoprazole (PROTONIX) IV  40 mg Intravenous Q12H  . sodium chloride  3 mL Intravenous Q12H   Continuous Infusions: . pantoprozole (PROTONIX) infusion 8 mg/hr (05/16/13 1220)    Principal Problem:   Upper GI bleeding Active Problems:   COPD (chronic obstructive pulmonary disease)   Hyperlipidemia   HTN (hypertension)   Protein-calorie malnutrition, severe   Symptomatic anemia   Heme positive stool   Chronic respiratory failure   Acute blood loss anemia   Bleeding duodenal ulcer   Gastritis    Time spent: 19minutes    Elmarie Shiley M.D. [facility] Pager [phone]. If 7PM-7AM, please contact night-coverage at [email or web address], password [facility] 05/16/2013, 4:42 PM  LOS: 3 days

## 2013-05-17 ENCOUNTER — Encounter (HOSPITAL_COMMUNITY): Payer: Self-pay | Admitting: Gastroenterology

## 2013-05-17 LAB — CBC
HCT: 27.5 % — ABNORMAL LOW (ref 36.0–46.0)
HEMOGLOBIN: 9.3 g/dL — AB (ref 12.0–15.0)
MCH: 30.9 pg (ref 26.0–34.0)
MCHC: 33.8 g/dL (ref 30.0–36.0)
MCV: 91.4 fL (ref 78.0–100.0)
Platelets: 305 10*3/uL (ref 150–400)
RBC: 3.01 MIL/uL — ABNORMAL LOW (ref 3.87–5.11)
RDW: 17.5 % — ABNORMAL HIGH (ref 11.5–15.5)
WBC: 7.9 10*3/uL (ref 4.0–10.5)

## 2013-05-17 MED ORDER — LOSARTAN POTASSIUM 50 MG PO TABS
100.0000 mg | ORAL_TABLET | Freq: Every morning | ORAL | Status: DC
  Administered 2013-05-17 – 2013-05-18 (×2): 100 mg via ORAL
  Filled 2013-05-17 (×2): qty 2

## 2013-05-17 NOTE — Progress Notes (Signed)
Physical Therapy Treatment Patient Details Name: Jackie Walls MRN: [id number] DOB: 04/18/1945 Today's Date: 05/17/2013    History of Present Illness      PT Comments    Pt eager to participate with therapy today.  Min guard and cueing for appropriate hand placement with sit to stand/stand to sit.  Gait training for 34 feet with min guard and cueing for posture and to increase stride length for more normalized gait mechanics and energy conservation.  Pt left in chair with call bell within reach.  Pt limited by fatigue, no reports of pain through session.    Follow Up Recommendations        Equipment Recommendations       Recommendations for Other Services       Precautions / Restrictions Precautions Precautions: Fall Precaution Comments: MRSA Restrictions Weight Bearing Restrictions: No    Mobility  Bed Mobility               General bed mobility comments: pt sitting in chair upon therapist arrival  Transfers Overall transfer level: Needs assistance Equipment used: Rolling walker (2 wheeled) Transfers: Sit to/from Stand Sit to Stand: Min guard         General transfer comment: cues for hand placement for increased safety, wide base of support  Ambulation/Gait Ambulation/Gait assistance: Min guard Ambulation Distance (Feet): 34 Feet Assistive device: Rolling walker (2 wheeled) Gait Pattern/deviations: Decreased stride length;Trunk flexed   Gait velocity interpretation: Below normal speed for age/gender     Stairs            Wheelchair Mobility    Modified Rankin (Stroke Patients Only)       Balance                                    Cognition Arousal/Alertness: Awake/alert Behavior During Therapy: WFL for tasks assessed/performed Overall Cognitive Status: Within Functional Limits for tasks assessed                      Exercises      General Comments        Home Living                       Prior Function            PT Goals (current goals can now be found in the care plan section) Progress towards PT goals: Progressing toward goals    Frequency       PT Plan Current plan remains appropriate    Co-evaluation             End of Session Equipment Utilized During Treatment: Gait belt;Oxygen Activity Tolerance: Patient limited by fatigue Patient left: in chair;with call bell/phone within reach;with chair alarm set     Time: 1207-1230 PT Time Calculation (min): 23 min  Charges:  $Gait Training: 8-22 mins $Therapeutic Activity: 8-22 mins                    G Codes:      Aldona Lento 05/17/2013, 12:29 PM

## 2013-05-17 NOTE — Progress Notes (Addendum)
Subjective:  Patient thought she was going home today. Denies abdominal pain, n/v. Eating breakfast as fast as she can. BM normal in color per patient.   Objective: Vital signs in last 24 hours: Temp:  [98 F (36.7 C)-98.3 F (36.8 C)] 98 F (36.7 C) (05/14 0509) Pulse Rate:  [90-92] 90 (05/14 0509) Resp:  [18] 18 (05/14 0509) BP: (160-181)/(69-79) 181/79 mmHg (05/14 0509) SpO2:  [95 %-100 %] 98 % (05/14 0719) Last BM Date: 05/16/13 General:   Alert,  Well-developed, well-nourished, pleasant and cooperative in NAD Head:  Normocephalic and atraumatic. Eyes:  Sclera clear, no icterus.   Abdomen:  Soft, nontender and nondistended.  Normal bowel sounds, without guarding, and without rebound.  Midline incision with multiple staples, no erythema or drainage. Extremities:  Without clubbing, deformity or edema. Neurologic:  Alert and  oriented x4;  grossly normal neurologically. Skin:  Intact without significant lesions or rashes. Psych:  Alert and cooperative. Normal mood and affect.  Intake/Output from previous day: 05/13 0701 - 05/14 0700 In: 1120 [P.O.:840; I.V.:280] Out: 850 [Urine:850] Intake/Output this shift:    Lab Results: CBC  Recent Labs  05/15/13 1620 05/16/13 0500 05/17/13 0511  WBC 7.7 7.6 7.9  HGB 9.7* 9.3* 9.3*  HCT 27.0* 26.6* 27.5*  MCV 89.1 90.5 91.4  PLT 290 299 305   BMET  Recent Labs  05/15/13 0523 05/16/13 0500  NA 140 141  K 3.0* 4.0  CL 108 111  CO2 22 22  GLUCOSE 86 95  BUN 17 14  CREATININE 0.46* 0.44*  CALCIUM 8.0* 8.4   LFTs Lab Results  Component Value Date   ALT 16 05/13/2013   AST 18 05/13/2013   ALKPHOS 100 05/13/2013   BILITOT <0.2* 05/13/2013           Imaging Studies: Ct Abdomen Pelvis W Contrast  04/25/2013   CLINICAL DATA:  Abdominal pain  EXAM: CT ABDOMEN AND PELVIS WITH CONTRAST  TECHNIQUE: Multidetector CT imaging of the abdomen and pelvis was performed using the standard protocol following bolus administration of  intravenous contrast.  CONTRAST:  36mL OMNIPAQUE IOHEXOL 300 MG/ML SOLN, 131mL OMNIPAQUE IOHEXOL 300 MG/ML SOLN  COMPARISON:  DG PELVIS 1-2V dated 03/06/2013; CT ANGIO ABD-PELV W/ CM dated 10/13/2012  FINDINGS: A moderate amount of free intraperitoneal gas is present in the nondependent abdomen. The exact source of the gas is not known. Bowel rupture is suspected.  Diffuse hepatic steatosis  Gallbladder, spleen, pancreas are within normal limits. Prominence of the left adrenal gland is stable. Right adrenal gland is unremarkable  Stable low-density lesions in the kidneys. Chronic changes of the kidneys.  Maximal transverse diameter of the abdominal aortic aneurysm is 4.0 cm. Previously, this was 3.8 cm.  There is a small amount of free fluid layering in the pelvis and adjacent to the tip of the liver.  Moderate hiatal hernia.  Advanced atherosclerosis of the aorta, visceral vasculature, and iliac vessels is not significantly changed.  The T11 compression fracture has worsened. Slight retropulsion of the superior endplate is present.  IMPRESSION: There is free intraperitoneal gas suggesting bowel rupture. The exact source is not clearly identified. Critical Value/emergent results were called by telephone at the time of interpretation on 04/25/2013 at 1:42 AM to Dr. Veryl Speak , who verbally acknowledged these results.  T11 compression deformity has progressed.  Abdominal aortic aneurysm has increased from 3.8 cm to 4.0 cm in maximal caliber. Recommend follow up by [place] in 1year. This recommendation follows ACR  consensus guidelines: White Paper of the ACR Incidental Findings Committee II on Vascular Findings. Joellyn Rued [id number]; 86:[id number].   Electronically Signed   By: Maryclare Bean M.D.   On: 04/25/2013 01:44       Ct Angio Abd/pel W/ And/or W/o  05/14/2013   CLINICAL DATA:  Abdominal aortic aneurysm with melena. Evaluate for an aorta-enteric fistula.  EXAM: CT ANGIOGRAPHY ABDOMEN AND PELVIS  TECHNIQUE:  Multidetector CT imaging of the abdomen and pelvis was performed using the standard protocol during bolus administration of intravenous contrast. Multiplanar reconstructed images including MIPs were obtained and reviewed to evaluate the vascular anatomy.  CONTRAST:  100 mL Omnipaque 350  COMPARISON:  04/25/2013 and 10/13/2012  FINDINGS: ARTERIAL FINDINGS:  Aorta: The infrarenal abdominal aortic aneurysm measures up to 4 cm and stable. There is circumferential mural thrombus in the aortic aneurysm which is similar to the previous examination. The duodenum is draped over the abdominal aortic aneurysm but there is no evidence for arterial flow within the duodenum or a fistula.  Celiac axis: Dilatation of the proximal celiac artery measuring up to 1.0 cm. The splenic artery is heavily calcified without significant dilatation. There is a critical stenosis involving the origin of celiac trunk with calcified plaque.  Superior mesenteric: Ectasia of the proximal SMA, measuring up to 1.0 cm. Mild narrowing of the proximal SMA just beyond the origin.  Left renal: There are 3 left renal arteries. Critical stenosis involving the most posterior of the left renal arteries.  Right renal: The origin of the right renal artery is heavily calcified and suspect at least moderate stenosis.  Inferior mesenteric: Origin of the IMA is occluded but there is distal reconstitution.  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 There is flow in the distal left common iliac artery, measuring up to 1.7 cm, and unchanged. There is flow in the left external and internal iliac arteries. Atherosclerotic disease in left external iliac artery and left common femoral artery. There is flow in the proximal left femoral arteries.  Right iliac: Again noted is an enlarged right lumbar artery which is likely providing collateral flow. There is high-grade stenosis involving the proximal right common iliac artery which  is unchanged. 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 There is flow in the right external and internal iliac arteries with atherosclerotic disease. Flow in the proximal right femoral arteries.  Venous findings: The portal venous system and IVC are patent. Flow in the renal veins.  Review of the MIP images confirms the above findings.  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 Lung bases are clear without pleural fluid. No evidence for free air. There is a suspicious exophytic lesion in the left kidney upper pole measuring 0.8 cm and minimally changed from 10/13/2012. This is a complex left renal lesion which is indeterminate. Hounsfield units roughly measure 67 on this postcontrast examination. There is another complex lesion in the posterior left kidney mid pole. Again noted is mild atrophy in the left kidney with a few renal cortical cysts. Stable low-density structure in the right kidney cortex is suggestive for a cortical cyst. No gross abnormality to the adrenal glands, liver, gallbladder, pancreas or spleen. No significant free fluid or lymphadenopathy. Fluid in the urinary bladder. No gross abnormality to the uterus. No gross abnormality to the small or large bowel. There are old bilateral pubic rami fractures.  There are bilateral sacral fractures with diffuse sclerosis. Increased sclerosis on the right side of the sacrum compared to the recent comparison examination.  IMPRESSION: The abdominal aortic aneurysm is stable, measuring up to 4 cm. No evidence for an aortic-enteric fistula.  There are two indeterminate complex lesions involving the left kidney. Small renal cell carcinomas cannot be excluded. The left renal lesions could be better characterized with an MRI (pre and post contrast).  Severe atherosclerotic disease throughout the aorta, visceral arteries and iliac arteries.  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  Old fractures involving the bilateral pubic rami and both sides of the sacrum.  These results will be called to the ordering clinician or representative by the Radiologist Assistant, and communication documented in the PACS Dashboard.   Electronically Signed   By: Markus Daft M.D.   On: 05/14/2013 15:27  [2 weeks]   Assessment: 68 year old female admitted with melena and acute on chronic anemia, with past history of perforated duodenal ulcer s/p laparotomy on 4/21. EGD this admission with duodenal ulceration, visible vessel with active oozing of blood in 2nd portion of duodenum, s/p cautery. 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 Clinically improved, Hgb remaining stable, no further melena.   As of note, H.pylori serology negative April 2015  There are two indeterminate complex lesions involving the left kidney. Small renal cell carcinomas cannot be excluded. The left renal lesions could be better characterized with an MRI (pre and  post contrast).   Severe atherosclerotic disease throughout the aorta, visceral arteries and iliac arteries.   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  Plan: 1. Protonix drip for total of 72 hours. Then PPI BID for 3 months, then daily forever. 2. F/u pending biopsy 3. Avoid NSAIDs/ASA forever. 4. Patient with significant atherosclerotic disease as noted on CTA. Recommend she follow up with vascular specialist. 5. She will need outpatient MRI to further evaluate renal lesions. Should be orchestrated by her PCP at discharge.   6. ?discharge tomorrow? From GI standpoint.    LOS: 4 days   Mahala Menghini  05/17/2013, 7:56 AM   Addendum: Discussed with Dr. Gala Romney: Plavix can be resume in two weeks.

## 2013-05-17 NOTE — Progress Notes (Signed)
[facility] PROGRESS NOTE  Jackie Walls PYK:[id number] DOB: 1945/08/26 DOA: 05/13/2013 PCP: Sherrie Mustache, MD  Assessment/Plan: 1- upper GI bleed/Bleeding duodenal ulcer and gastritis Secondary to bleeding duodenal ulcer and gastritis noted on upper endoscopy 05/15/2013. Patient was recently hospitalized and treated for perforated gastric ulcer from 4/21 to 05/02/2013. H. pylori serologies from last hospitalization was negative. Patient's hemoglobin on admission was 6.2. Patient status post 4 units packed red blood cells and hemoglobin currently at 9.3.  Continue Protonix drip for next 72 hours until 5-15.  and then PPI twice a day for 3 months and then once a daily indefinitely. Biopsies pending. No more NSAIDS/ASA forever. Serial CBCs. GI ff and appreciate input and rxcs.   -2 acute blood loss anemia/symptomatic anemia Secondary to problem #1. Patient on admission had a hemoglobin of 6.2. Patient's last hemoglobin from her last hospitalization was 9.9 on 04/30/2013. Patient is status post 4 units packed red blood cells and repeat hemoglobin is currently at 9.2.  Follow H&H. Transfusion threshold hemoglobin less than 7 or active bleeding.  3 COPD and chronic respiratory failure Stable. Continue bronchodilators. Continue oxygen, Tessalon Perles. Follow.  4 hypertension Continue with Norvasc. Resume cozaar.   5 history of CVA status post right carotid endarterectomy Patient was on Plavix for probable secondary stroke prevention. Plavix has been held secondary to problem #1. GI to determine when plavix may be resumed.  6 tobacco abuse Tobacco cessation. Continue nicotine patch.  #7 hypokalemia Replete.  #8 prophylaxis PPI for GI prophylaxis. SCDs for DVT prophylaxis.  Code Status: Full Family Communication: Updated patient , husband and son at bedside. Disposition Plan: : Home  when medically stable.   Consultants:  Gastroenterology: Dr. Gala Romney  05/14/2013  Procedures:  Chest x-ray 05/13/2013   4 units packed red blood cells 05/10-5/11/2013  Upper endoscopy 05/15/2013-per Dr. Barney Drain  CT angiogram abdomen and pelvis 05/14/2013  Antibiotics:  None  HPI/Subjective: No more melena. She is willing to stay overnight. No abdominal pain, no black stool.   Objective: Filed Vitals:   05/17/13 0509  BP: 181/79  Pulse: 90  Temp: 98 F (36.7 C)  Resp: 18    Intake/Output Summary (Last 24 hours) at 05/17/13 1338 Last data filed at 05/17/13 1126  Gross per 24 hour  Intake    768 ml  Output   1050 ml  Net   -282 ml   Filed Weights   05/13/13 1031 05/13/13 1708  Weight: 57.153 kg (126 lb) 52.1 kg (114 lb 13.8 oz)    Exam:   General:  NAD  Cardiovascular: RRR  Respiratory: No crackles.  Abdomen: Soft, nontender, nondistended, positive bowel sounds. Incision site looks clean dry and intact.  Musculoskeletal: No clubbing cyanosis or edema.   Data Reviewed: Basic Metabolic Panel:  Recent Labs Lab 05/13/13 1053 05/14/13 0519 05/15/13 0523 05/16/13 0500  NA 136* 139 140 141  K 3.7 3.6* 3.0* 4.0  CL 100 109 108 111  CO2 25 21 22 22   GLUCOSE 121* 96 86 95  BUN 41* 40* 17 14  CREATININE 0.54 0.43* 0.46* 0.44*  CALCIUM 8.7 7.8* 8.0* 8.4   Liver Function Tests:  Recent Labs Lab 05/13/13 1053  AST 18  ALT 16  ALKPHOS 100  BILITOT <0.2*  PROT 5.1*  ALBUMIN 2.4*   No results found for this basename: LIPASE, AMYLASE,  in the last 168 hours No results found for this basename: AMMONIA,  in the last 168 hours CBC:  Recent Labs  Lab 05/13/13 1053  05/14/13 0519 05/14/13 2135 05/15/13 0523 05/15/13 1620 05/16/13 0500 05/17/13 0511  WBC 9.4  < > 6.9  --  6.4 7.7 7.6 7.9  NEUTROABS 8.0*  --   --   --   --   --   --   --   HGB 6.2*  < > 6.3* 9.7* 9.2* 9.7* 9.3* 9.3*  HCT 18.2*  < > 18.0* 27.1* 25.9* 27.0* 26.6* 27.5*  MCV 91.5  < > 86.5  --  88.4 89.1 90.5 91.4  PLT 398  < > 281  --  290 290  299 305  < > = values in this interval not displayed. Cardiac Enzymes:  Recent Labs Lab 05/13/13 1053  TROPONINI <0.30   BNP (last 3 results)  Recent Labs  05/13/13 1053  PROBNP 847.5*   CBG: No results found for this basename: GLUCAP,  in the last 168 hours  No results found for this or any previous visit (from the past 240 hour(s)).   Studies: No results found.  Scheduled Meds: . amLODipine  5 mg Oral q morning - 10a  . atorvastatin  40 mg Oral Daily  . benzonatate  100 mg Oral TID  . budesonide-formoterol  2 puff Inhalation BID  . feeding supplement (RESOURCE BREEZE)  1 Container Oral BID BM  . ipratropium-albuterol  3 mL Nebulization Q6H  . lactose free nutrition  237 mL Oral TID BM  . nicotine  14 mg Transdermal Daily  . [START ON 05/18/2013] pantoprazole (PROTONIX) IV  40 mg Intravenous Q12H  . sodium chloride  3 mL Intravenous Q12H   Continuous Infusions: . pantoprozole (PROTONIX) infusion 8 mg/hr (05/17/13 1035)    Principal Problem:   Upper GI bleeding Active Problems:   COPD (chronic obstructive pulmonary disease)   Hyperlipidemia   HTN (hypertension)   Protein-calorie malnutrition, severe   Symptomatic anemia   Heme positive stool   Chronic respiratory failure   Acute blood loss anemia   Bleeding duodenal ulcer   Gastritis    Time spent: 45minutes    Elmarie Shiley M.D. [facility] Pager [phone]. If 7PM-7AM, please contact night-coverage at [email or web address], password [facility] 05/17/2013, 1:38 PM  LOS: 4 days

## 2013-05-17 NOTE — Progress Notes (Signed)
Abdomen soft, nontender. Staples removed and Steri-Strips applied. Superficial wound separation is noted. Keep clean with soap or water. Will followup patient in my office.

## 2013-05-17 NOTE — Progress Notes (Signed)
05/17/13 1137 Late entry for 1115. Patient requesting "I want to see the doctor, I want to go home". Husband and son at bedside, pt requested they have update from MD regarding discharge plan. Text-paged Dr. Tyrell Antonio to notify. Donavan Foil, RN

## 2013-05-17 NOTE — Plan of Care (Signed)
Problem: Phase II Progression Outcomes Goal: Progress activity as tolerated unless otherwise ordered Outcome: Progressing 05/17/13 1521 Patient assisted up to chair this afternoon, tolerated well. General weakness. Activity progressing as tolerated, PT consult in place. Donavan Foil, RN

## 2013-05-18 MED ORDER — PANTOPRAZOLE SODIUM 40 MG PO TBEC: DELAYED_RELEASE_TABLET | ORAL | Status: DC

## 2013-05-18 MED ORDER — BOOST / RESOURCE BREEZE PO LIQD: 1.0000 | Freq: Two times a day (BID) | ORAL | Status: DC

## 2013-05-18 MED ORDER — IPRATROPIUM-ALBUTEROL 18-103 MCG/ACT IN AERO: 1.0000 | INHALATION_SPRAY | Freq: Four times a day (QID) | RESPIRATORY_TRACT | Status: DC | PRN

## 2013-05-18 MED ORDER — BENZONATATE 100 MG PO CAPS: 100.0000 mg | ORAL_CAPSULE | Freq: Three times a day (TID) | ORAL | Status: DC

## 2013-05-18 NOTE — Progress Notes (Signed)
CSW let message for Jackie Walls at [facility] as per report that patient's sister had concerns about home issues and they were encouraged to contact APS.  [name], LCSWA left message  receive message from DSS by Jackie Walls at [place] yesterday.  Jackie Walls was notified of d/c home today. Not seen by CSW services; no order in place and d/c'd prior to CSW being able to see patient.  Lorie Phenix. [place], [place]

## 2013-05-18 NOTE — Progress Notes (Signed)
Subjective:  No further bleeding noted. Patient wants to go home. Denies abdominal pain, vomiting.  Objective: Vital signs in last 24 hours: Temp:  [97.8 F (36.6 C)] 97.8 F (36.6 C) (05/14 1500) Pulse Rate:  [86] 86 (05/14 1500) Resp:  [20] 20 (05/14 1500) BP: (151)/(52) 151/52 mmHg (05/14 1500) SpO2:  [96 %-99 %] 99 % (05/15 0658) Last BM Date: 05/18/13 General:   Alert,  Well-developed, well-nourished, pleasant and cooperative in NAD Head:  Normocephalic and atraumatic. Eyes:  Sclera clear, no icterus.   Abdomen:  Soft, nontender and nondistended.  Normal bowel sounds, without guarding, and without rebound.   Extremities:  Without clubbing, deformity or edema. Neurologic:  Alert and  oriented x4;  grossly normal neurologically. Skin:  Intact without significant lesions or rashes. Psych:  Alert and cooperative. Normal mood and affect.  Intake/Output from previous day: 05/14 0701 - 05/15 0700 In: 313.4 [I.V.:313.4] Out: 200 [Urine:200] Intake/Output this shift:    Lab Results: CBC  Recent Labs  05/15/13 1620 05/16/13 0500 05/17/13 0511  WBC 7.7 7.6 7.9  HGB 9.7* 9.3* 9.3*  HCT 27.0* 26.6* 27.5*  MCV 89.1 90.5 91.4  PLT 290 299 305   BMET  Recent Labs  05/16/13 0500  NA 141  K 4.0  CL 111  CO2 22  GLUCOSE 95  BUN 14  CREATININE 0.44*  CALCIUM 8.4   LFTs No results found for this basename: BILITOT, BILIDIR, IBILI, ALKPHOS, AST, ALT, PROT, ALBUMIN,  in the last 72 hours No results found for this basename: LIPASE,  in the last 72 hours PT/INR No results found for this basename: LABPROT, INR,  in the last 72 hours    Imaging Studies: Ct Abdomen Pelvis W Contrast  04/25/2013   CLINICAL DATA:  Abdominal pain  EXAM: CT ABDOMEN AND PELVIS WITH CONTRAST  TECHNIQUE: Multidetector CT imaging of the abdomen and pelvis was performed using the standard protocol following bolus administration of intravenous contrast.  CONTRAST:  76mL OMNIPAQUE IOHEXOL 300  MG/ML SOLN, 161mL OMNIPAQUE IOHEXOL 300 MG/ML SOLN  COMPARISON:  DG PELVIS 1-2V dated 03/06/2013; CT ANGIO ABD-PELV W/ CM dated 10/13/2012  FINDINGS: A moderate amount of free intraperitoneal gas is present in the nondependent abdomen. The exact source of the gas is not known. Bowel rupture is suspected.  Diffuse hepatic steatosis  Gallbladder, spleen, pancreas are within normal limits. Prominence of the left adrenal gland is stable. Right adrenal gland is unremarkable  Stable low-density lesions in the kidneys. Chronic changes of the kidneys.  Maximal transverse diameter of the abdominal aortic aneurysm is 4.0 cm. Previously, this was 3.8 cm.  There is a small amount of free fluid layering in the pelvis and adjacent to the tip of the liver.  Moderate hiatal hernia.  Advanced atherosclerosis of the aorta, visceral vasculature, and iliac vessels is not significantly changed.  The T11 compression fracture has worsened. Slight retropulsion of the superior endplate is present.  IMPRESSION: There is free intraperitoneal gas suggesting bowel rupture. The exact source is not clearly identified. Critical Value/emergent results were called by telephone at the time of interpretation on 04/25/2013 at 1:42 AM to Dr. Veryl Speak , who verbally acknowledged these results.  T11 compression deformity has progressed.  Abdominal aortic aneurysm has increased from 3.8 cm to 4.0 cm in maximal caliber. Recommend follow up by [place] in 1year. This recommendation follows ACR consensus guidelines: White Paper of the ACR Incidental Findings Committee II on Vascular Findings. J  Am [place] Ar [id number]; 34:[id number].   Electronically Signed   By: Maryclare Bean M.D.   On: 04/25/2013 01:44    Ct Angio Abd/pel W/ And/or W/o  05/14/2013   CLINICAL DATA:  Abdominal aortic aneurysm with melena. Evaluate for an aorta-enteric fistula.  EXAM: CT ANGIOGRAPHY ABDOMEN AND PELVIS  TECHNIQUE: Multidetector CT imaging of the abdomen and pelvis was performed using the  standard protocol during bolus administration of intravenous contrast. Multiplanar reconstructed images including MIPs were obtained and reviewed to evaluate the vascular anatomy.  CONTRAST:  100 mL Omnipaque 350  COMPARISON:  04/25/2013 and 10/13/2012  FINDINGS: ARTERIAL FINDINGS:  Aorta: The infrarenal abdominal aortic aneurysm measures up to 4 cm and stable. There is circumferential mural thrombus in the aortic aneurysm which is similar to the previous examination. The duodenum is draped over the abdominal aortic aneurysm but there is no evidence for arterial flow within the duodenum or a fistula.  Celiac axis: Dilatation of the proximal celiac artery measuring up to 1.0 cm. The splenic artery is heavily calcified without significant dilatation. There is a critical stenosis involving the origin of celiac trunk with calcified plaque.  Superior mesenteric: Ectasia of the proximal SMA, measuring up to 1.0 cm. Mild narrowing of the proximal SMA just beyond the origin.  Left renal: There are 3 left renal arteries. Critical stenosis involving the most posterior of the left renal arteries.  Right renal: The origin of the right renal artery is heavily calcified and suspect at least moderate stenosis.  Inferior mesenteric: Origin of the IMA is occluded but there is distal reconstitution.  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 There is flow in the distal left common iliac artery, measuring up to 1.7 cm, and unchanged. There is flow in the left external and internal iliac arteries. Atherosclerotic disease in left external iliac artery and left common femoral artery. There is flow in the proximal left femoral arteries.  Right iliac: Again noted is an enlarged right lumbar artery which is likely providing collateral flow. There is high-grade stenosis involving the proximal right common iliac artery which is unchanged. 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 There is flow in the right external and internal iliac arteries with atherosclerotic disease. Flow in the proximal right femoral arteries.  Venous findings: The portal venous system and IVC are patent. Flow in the renal veins.  Review of the MIP images confirms the above findings.  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 Lung bases are clear without pleural fluid. No evidence for free air. There is a suspicious exophytic lesion in the left kidney upper pole measuring 0.8 cm and minimally changed from 10/13/2012. This is a complex left renal lesion which is indeterminate. Hounsfield units roughly measure 67 on this postcontrast examination. There is another complex lesion in the posterior left kidney mid pole. Again noted is mild atrophy in the left kidney with a few renal cortical cysts. Stable low-density structure in the right kidney cortex is suggestive for a cortical cyst. No gross abnormality to the adrenal glands, liver, gallbladder, pancreas or spleen. No significant free fluid or lymphadenopathy. Fluid in the urinary bladder. No gross abnormality to the uterus. No gross abnormality to the small or large bowel. There are old bilateral pubic rami fractures. There are bilateral sacral fractures with diffuse sclerosis. Increased sclerosis on the right side of the sacrum compared  to the recent comparison examination.  IMPRESSION: The abdominal aortic aneurysm is stable, measuring up to 4 cm. No evidence for an aortic-enteric fistula.  There are two indeterminate complex lesions involving the left kidney. Small renal cell carcinomas cannot be excluded. The left renal lesions could be better characterized with an MRI (pre and post contrast).  Severe atherosclerotic disease throughout the aorta, visceral arteries and iliac arteries.  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  Old fractures involving the bilateral pubic rami and both sides of the sacrum.  These results will be called to the ordering clinician or representative by the Radiologist Assistant, and communication documented in the PACS Dashboard.   Electronically Signed   By: Markus Daft M.D.   On: 05/14/2013 15:27  [2 weeks]   Assessment: 68 year old female admitted with melena and acute on chronic anemia, with past history of perforated duodenal ulcer s/p laparotomy on 4/21. EGD this admission with duodenal ulceration, visible vessel with active oozing of blood in 2nd portion of duodenum, s/p cautery. 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 Clinically improved, Hgb remaining stable, no further melena.   As of note, H.pylori serology negative April 2015. Pathology from latest EGD was benign and no H.Pylori.  There are two indeterminate complex lesions involving the left kidney. Small renal cell carcinomas cannot be excluded. The left renal lesions could be better characterized with an MRI (pre and post contrast).  Severe atherosclerotic disease throughout the aorta, visceral arteries and iliac arteries.   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  Plan: 1. Protonix drip complete today. Go home on PPI BID for 3 months, then daily forever. 2. Avoid NSAIDs/ASA powders forever. 3. May resume Plavix in 2 weeks and if clinically indicated may use low-dose ASA after one month.  4. Patient with significant atherosclerotic disease as noted on CTA. Recommend she follow up with vascular specialist. 5. She will need outpatient MRI to further evaluate renal lesions.  Should be orchestrated by her PCP at discharge.      LOS: 5 days   Mahala Menghini  05/18/2013, 8:10 AM

## 2013-05-18 NOTE — Progress Notes (Signed)
IV removed. Discharge instructions reviewed. Patient verbalized understanding. Awaiting ride for discharge home. Family aware they need to bring oxygen tank.

## 2013-05-18 NOTE — Discharge Summary (Signed)
Physician Discharge Summary  Jackie Walls [id number] DOB: [birth year] DOA: 05/13/2013  PCP: Sherrie Mustache, MD  Admit date: 05/13/2013 Discharge date: 05/18/2013  Time spent: 35 minutes  Recommendations for Outpatient Follow-up:  Patient with significant atherosclerotic disease as noted on CTA. Recommend she follow up with vascular specialist. She will need outpatient MRI to further evaluate renal lesions. Should be orchestrated by her PCP. Need CB, if hemoglobin stable ok to resume plavix in 2 weeks per GI.    Discharge Diagnoses:    Upper GI bleeding, Bleeding duodenal ulcer   Acute blood loss anemia   COPD (chronic obstructive pulmonary disease)   Hyperlipidemia   HTN (hypertension)   Protein-calorie malnutrition, severe   Symptomatic anemia   Heme positive stool   Chronic respiratory failure   Gastritis   Discharge Condition: Stable.   Diet recommendation: Heart Healthy  Filed Weights   05/13/13 1031 05/13/13 1708  Weight: 57.153 kg (126 lb) 52.1 kg (114 lb 13.8 oz)    History of present illness:  Jackie Walls is a 68 y.o. female who was recently discharged from the hospital after being treated for perforated gastric ulcer. She did have significant anemia during the hospitalization and required blood transfusions. Hemoglobin on discharge was noted to be 9.9. The patient was discharged to a skilled nursing facility. Unfortunately, her family was unhappy with the care the patient was receiving at that facility and decided to discharge her home. She is brought to the emergency room today with complaints of shortness of breath. She reports onset of symptoms occurring yesterday morning. This progressively got worse which has prompted her ER visit. She denies any chest pain or abdominal pain. Denies any fever or cough. Shortness of breath occurred while at rest and has been persistent. She's not had any wheezing. She describes his stool as dark green. She reports  having 3-4 bowel movements per day. ER notes indicate that patient was found to have black colored stool which tested heme positive. Lab work in the emergency room indicated a hemoglobin of 6.2. BUN was also elevated at 41. She was initially tachycardic on admission which since improved. The patient is being admitted for further evaluation.   Hospital Course:  1- upper GI bleed/Bleeding duodenal ulcer and gastritis  Secondary to bleeding duodenal ulcer and gastritis noted on upper endoscopy 05/15/2013. Patient was recently hospitalized and treated for perforated gastric ulcer from 4/21 to 05/02/2013. H. pylori serologies from last hospitalization was negative. Patient's hemoglobin on admission was 6.2. Patient status post 4 units packed red blood cells and hemoglobin currently at 9.3.  She received  Protonix drip for 72 hours until 5-15. and then PPI twice a day for 3 months and then once a daily indefinitely. Biopsies pending. No more NSAIDS/ASA forever. Serial CBCs. GI ff and appreciate input and rxcs.   -2 acute blood loss anemia/symptomatic anemia  Secondary to problem #1. Patient on admission had a hemoglobin of 6.2. Patient's last hemoglobin from her last hospitalization was 9.9 on 04/30/2013. Patient is status post 4 units packed red blood cells and repeat hemoglobin is currently at 9.2. Follow H&H. Transfusion threshold hemoglobin less than 7 or active bleeding.   3 COPD and chronic respiratory failure  Stable. Continue bronchodilators. Continue oxygen, Tessalon Perles. Follow.   4 hypertension  Continue with Norvasc. Resume cozaar.   5 history of CVA status post right carotid endarterectomy  Patient was on Plavix for probable secondary stroke prevention. Plavix has been held secondary to problem #  1. GI to determine when plavix may be resumed.  Resume plavix in 2 weeks if hb stable. See GI recommendations.   6 tobacco abuse  Tobacco cessation. Continue nicotine patch.  #7 hypokalemia   Replete.  #8 prophylaxis  PPI for GI prophylaxis. SCDs for DVT prophylaxis   Procedures: Endoscopy; 5-12: ENDOSCOPIC IMPRESSION: 1. MELENA DUE TO BLEEDING DUODENAL ULCERS 2. MODERTAE Erosive gastritis 3. ONE CLEAN-BASED DUODENAL ULCER  Consultations:  GI  Surgery  Discharge Exam: Filed Vitals:   05/17/13 1500  BP: 151/52  Pulse: 86  Temp: 97.8 F (36.6 C)  Resp: 20    General: No distress.  Cardiovascular: S 1, S 2 RRR Respiratory: CTA Abdomen with clean dressing, soft, NT  Discharge Instructions You were cared for by a hospitalist during your hospital stay. If you have any questions about your discharge medications or the care you received while you were in the hospital after you are discharged, you can call the unit and asked to speak with the hospitalist on call if the hospitalist that took care of you is not available. Once you are discharged, your primary care physician will handle any further medical issues. Please note that NO REFILLS for any discharge medications will be authorized once you are discharged, as it is imperative that you return to your primary care physician (or establish a relationship with a primary care physician if you do not have one) for your aftercare needs so that they can reassess your need for medications and monitor your lab values.  Discharge Instructions   Diet - low sodium heart healthy    Complete by:  As directed      Increase activity slowly    Complete by:  As directed             Medication List    STOP taking these medications       clopidogrel 75 MG tablet  Commonly known as:  PLAVIX     meloxicam 7.5 MG tablet  Commonly known as:  MOBIC     omeprazole 20 MG capsule  Commonly known as:  PRILOSEC      TAKE these medications       albuterol-ipratropium 18-103 MCG/ACT inhaler  Commonly known as:  COMBIVENT  Inhale 1 puff into the lungs every 6 (six) hours as needed for wheezing or shortness of breath.      alendronate 70 MG tablet  Commonly known as:  FOSAMAX  Take 70 mg by mouth once a week. Take with a full glass of water on an empty stomach.     amLODipine 5 MG tablet  Commonly known as:  NORVASC  Take 5 mg by mouth every morning.     atorvastatin 40 MG tablet  Commonly known as:  LIPITOR  Take 40 mg by mouth daily.     benzonatate 100 MG capsule  Commonly known as:  TESSALON  Take 1 capsule (100 mg total) by mouth 3 (three) times daily.     BREO ELLIPTA 100-25 MCG/INH Aepb  Generic drug:  Fluticasone Furoate-Vilanterol  Inhale 1 Inhaler into the lungs daily.     budesonide-formoterol 160-4.5 MCG/ACT inhaler  Commonly known as:  SYMBICORT  Inhale 2 puffs into the lungs 2 (two) times daily.     calcium-vitamin D 500-200 MG-UNIT per tablet  Commonly known as:  OSCAL WITH D  Take 1 tablet by mouth 2 (two) times daily.     cetirizine 10 MG tablet  Commonly known as:  ZYRTEC  Take 10 mg by mouth every morning.     cholecalciferol 1000 UNITS tablet  Commonly known as:  VITAMIN D  Take 1,000 Units by mouth every evening.     doxazosin 4 MG tablet  Commonly known as:  CARDURA  Take 4 mg by mouth every morning.     feeding supplement (RESOURCE BREEZE) Liqd  Take 1 Container by mouth 2 (two) times daily between meals.     HEMATINIC PLUS VIT/MINERALS PO  Take 1 tablet by mouth 2 (two) times daily.     losartan 50 MG tablet  Commonly known as:  COZAAR  Take 100 mg by mouth every morning.     multivitamin with minerals Tabs tablet  Take 1 tablet by mouth daily.     pantoprazole 40 MG tablet  Commonly known as:  PROTONIX  Take 40 mg BID for 3 Month, then 40 mg daily.       Allergies  Allergen Reactions  . Ibuprofen     History of gastric ulcers.       Follow-up Information   Follow up with Jamesetta So, MD. Schedule an appointment as soon as possible for a visit on 05/31/2013.   Specialty:  General Surgery   Contact information:   [address] [address] [phone]       Follow up with [facility].   Contact information:   [address] [place] [address] [phone]       Follow up with Sherrie Mustache, MD In 2 weeks.   Specialty:  Family Medicine   Contact information:   [place] [address] [phone]        The results of significant diagnostics from this hospitalization (including imaging, microbiology, ancillary and laboratory) are listed below for reference.    Significant Diagnostic Studies: Ct Abdomen Pelvis W Contrast  04/25/2013   CLINICAL DATA:  Abdominal pain  EXAM: CT ABDOMEN AND PELVIS WITH CONTRAST  TECHNIQUE: Multidetector CT imaging of the abdomen and pelvis was performed using the standard protocol following bolus administration of intravenous contrast.  CONTRAST:  78mL OMNIPAQUE IOHEXOL 300 MG/ML SOLN, 113mL OMNIPAQUE IOHEXOL 300 MG/ML SOLN  COMPARISON:  DG PELVIS 1-2V dated 03/06/2013; CT ANGIO ABD-PELV W/ CM dated 10/13/2012  FINDINGS: A moderate amount of free intraperitoneal gas is present in the nondependent abdomen. The exact source of the gas is not known. Bowel rupture is suspected.  Diffuse hepatic steatosis  Gallbladder, spleen, pancreas are within normal limits. Prominence of the left adrenal gland is stable. Right adrenal gland is unremarkable  Stable low-density lesions in the kidneys. Chronic changes of the kidneys.  Maximal transverse diameter of the abdominal aortic aneurysm is 4.0 cm. Previously, this was 3.8 cm.  There is a small amount of free fluid layering in the pelvis and adjacent to the tip of the liver.  Moderate hiatal hernia.  Advanced atherosclerosis of the aorta, visceral vasculature, and iliac vessels is not significantly changed.  The T11 compression fracture has worsened. Slight retropulsion of the superior endplate is present.  IMPRESSION: There is free intraperitoneal gas suggesting bowel rupture. The exact  source is not clearly identified. Critical Value/emergent results were called by telephone at the time of interpretation on 04/25/2013 at 1:42 AM to Dr. Veryl Speak , who verbally acknowledged these results.  T11 compression deformity has progressed.  Abdominal aortic aneurysm has increased from 3.8 cm to 4.0 cm in maximal caliber. Recommend follow up by [place] in  1year. This recommendation follows ACR consensus guidelines: White Paper of the ACR Incidental Findings Committee II on Vascular Findings. Joellyn Rued [id number].   Electronically Signed   By: Maryclare Bean M.D.   On: 04/25/2013 01:44   Dg Chest Portable 1 View  05/13/2013   CLINICAL DATA:  Shortness of breath, loss of appetite, weakness  EXAM: PORTABLE CHEST - 1 VIEW  COMPARISON:  DG CHEST 1V PORT dated 04/25/2013; DG CHEST 1V PORT dated 03/04/2013; DG CHEST 1V dated 01/31/2013  FINDINGS: The heart size and mediastinal contours are within normal limits. Both lungs are clear. The visualized skeletal structures are unremarkable.  IMPRESSION: No active disease.   Electronically Signed   By: Skipper Cliche M.D.   On: 05/13/2013 11:09   Dg Chest Port 1 View  04/25/2013   CLINICAL DATA:  Abdominal pain.  EXAM: PORTABLE CHEST - 1 VIEW  COMPARISON:  DG CHEST 1V PORT dated 03/04/2013; CT CHEST W/O CM dated 01/29/2013; DG CHEST 1V dated 01/31/2013; CT ABD - PELV W/ CM dated 04/25/2013  FINDINGS: Free intra-abdominal air is demonstrated under the right hemidiaphragm, correlating with known free air seen at previous CT scan. Mild cardiac enlargement with normal pulmonary vascularity. Shallow inspiration. No focal airspace disease or consolidation. No pneumothorax.  IMPRESSION: Cardiac enlargement. No evidence of active pulmonary disease. Known free intraperitoneal air is demonstrated.   Electronically Signed   By: Lucienne Capers M.D.   On: 04/25/2013 02:40   Dg Foot Complete Left  04/28/2013   CLINICAL DATA:  Left foot pain.  EXAM: LEFT FOOT - COMPLETE 3+ VIEW   COMPARISON:  None.  FINDINGS: The bones are osteopenic. There is no evidence of acute fracture or dislocation. Joint spaces are preserved. An oblong, peripherally sclerotic focus in the distal phalanx of the great toe measures approximately 1.2 cm with sharply defined margins and a narrow zone of transition. A small linear focus of soft tissue calcification is noted within the plantar aspect of the midfoot.  IMPRESSION: 1. No evidence of acute osseous abnormality. 2. Osteopenia. 3. Sclerotic lesion in the distal phalanx of the great toe with benign appearance.   Electronically Signed   By: Logan Bores   On: 04/28/2013 11:52   Ct Angio Abd/pel W/ And/or W/o  05/14/2013   CLINICAL DATA:  Abdominal aortic aneurysm with melena. Evaluate for an aorta-enteric fistula.  EXAM: CT ANGIOGRAPHY ABDOMEN AND PELVIS  TECHNIQUE: Multidetector CT imaging of the abdomen and pelvis was performed using the standard protocol during bolus administration of intravenous contrast. Multiplanar reconstructed images including MIPs were obtained and reviewed to evaluate the vascular anatomy.  CONTRAST:  100 mL Omnipaque 350  COMPARISON:  04/25/2013 and 10/13/2012  FINDINGS: ARTERIAL FINDINGS:  Aorta: The infrarenal abdominal aortic aneurysm measures up to 4 cm and stable. There is circumferential mural thrombus in the aortic aneurysm which is similar to the previous examination. The duodenum is draped over the abdominal aortic aneurysm but there is no evidence for arterial flow within the duodenum or a fistula.  Celiac axis: Dilatation of the proximal celiac artery measuring up to 1.0 cm. The splenic artery is heavily calcified without significant dilatation. There is a critical stenosis involving the origin of celiac trunk with calcified plaque.  Superior mesenteric: Ectasia of the proximal SMA, measuring up to 1.0 cm. Mild narrowing of the proximal SMA just beyond the origin.  Left renal: There are 3 left renal arteries. Critical  stenosis involving the most posterior of the  left renal arteries.  Right renal: The origin of the right renal artery is heavily calcified and suspect at least moderate stenosis.  Inferior mesenteric: Origin of the IMA is occluded but there is distal reconstitution.  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 There is flow in the distal left common iliac artery, measuring up to 1.7 cm, and unchanged. There is flow in the left external and internal iliac arteries. Atherosclerotic disease in left external iliac artery and left common femoral artery. There is flow in the proximal left femoral arteries.  Right iliac: Again noted is an enlarged right lumbar artery which is likely providing collateral flow. There is high-grade stenosis involving the proximal right common iliac artery which is unchanged. 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 There is flow in the right external and internal iliac arteries with atherosclerotic disease. Flow in the proximal right femoral arteries.  Venous findings: The portal venous system and IVC are patent. Flow in the renal veins.  Review of the MIP images confirms the above findings.  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 Lung bases are clear without pleural fluid. No evidence for free air. There is a suspicious exophytic lesion in the left kidney upper pole measuring 0.8 cm and minimally changed from 10/13/2012. This is a complex left renal lesion which is indeterminate. Hounsfield units roughly measure 67 on this postcontrast examination. There is another complex lesion in the posterior left kidney mid pole. Again noted is mild atrophy in the left kidney with a few renal cortical cysts. Stable low-density structure in the right kidney cortex is suggestive for a cortical cyst. No gross abnormality to the adrenal glands, liver, gallbladder,  pancreas or spleen. No significant free fluid or lymphadenopathy. Fluid in the urinary bladder. No gross abnormality to the uterus. No gross abnormality to the small or large bowel. There are old bilateral pubic rami fractures. There are bilateral sacral fractures with diffuse sclerosis. Increased sclerosis on the right side of the sacrum compared to the recent comparison examination.  IMPRESSION: The abdominal aortic aneurysm is stable, measuring up to 4 cm. No evidence for an aortic-enteric fistula.  There are two indeterminate complex lesions involving the left kidney. Small renal cell carcinomas cannot be excluded. The left renal lesions could be better characterized with an MRI (pre and post contrast).  Severe atherosclerotic disease throughout the aorta, visceral arteries and iliac arteries.  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  Old fractures involving the bilateral pubic rami and both sides of the sacrum.  These results will be called to the ordering clinician or representative by the Radiologist Assistant, and communication documented in the PACS Dashboard.   Electronically Signed   By: Markus Daft M.D.   On: 05/14/2013 15:27    Microbiology: No results found for this or any previous visit (from the past 240 hour(s)).   Labs: Basic Metabolic Panel:  Recent Labs Lab 05/13/13 1053 05/14/13 0519 05/15/13 0523 05/16/13 0500  NA 136* 139 140 141  K 3.7 3.6* 3.0* 4.0  CL 100 109 108 111  CO2 25 21 22 22   GLUCOSE 121* 96 86 95  BUN 41*  40* 17 14  CREATININE 0.54 0.43* 0.46* 0.44*  CALCIUM 8.7 7.8* 8.0* 8.4   Liver Function Tests:  Recent Labs Lab 05/13/13 1053  AST 18  ALT 16  ALKPHOS 100  BILITOT <0.2*  PROT 5.1*  ALBUMIN 2.4*   No results found  for this basename: LIPASE, AMYLASE,  in the last 168 hours No results found for this basename: AMMONIA,  in the last 168 hours CBC:  Recent Labs Lab 05/13/13 1053  05/14/13 0519 05/14/13 2135 05/15/13 0523 05/15/13 1620 05/16/13 0500 05/17/13 0511  WBC 9.4  < > 6.9  --  6.4 7.7 7.6 7.9  NEUTROABS 8.0*  --   --   --   --   --   --   --   HGB 6.2*  < > 6.3* 9.7* 9.2* 9.7* 9.3* 9.3*  HCT 18.2*  < > 18.0* 27.1* 25.9* 27.0* 26.6* 27.5*  MCV 91.5  < > 86.5  --  88.4 89.1 90.5 91.4  PLT 398  < > 281  --  290 290 299 305  < > = values in this interval not displayed. Cardiac Enzymes:  Recent Labs Lab 05/13/13 1053  TROPONINI <0.30   BNP: BNP (last 3 results)  Recent Labs  05/13/13 1053  PROBNP 847.5*   CBG: No results found for this basename: GLUCAP,  in the last 168 hours     Signed:  Cimberly Stoffel A Anella Nakata  [facility] 05/18/2013, 9:34 AM

## 2013-05-21 NOTE — Progress Notes (Signed)
UR chart review completed.  

## 2013-05-22 NOTE — Progress Notes (Signed)
Quick Note:  Patient aware. ______ 

## 2013-05-30 ENCOUNTER — Telehealth: Payer: Self-pay | Admitting: Gastroenterology

## 2013-05-30 NOTE — Telephone Encounter (Signed)
Please follow with PCP. Recommendations from recent hospital stay.  1. "Patient with significant atherosclerotic disease as noted on CTA. Recommend she follow up with vascular specialist. 2. She will need outpatient MRI to further evaluate renal lesions. Should be orchestrated by her PCP at discharge.

## 2013-05-31 NOTE — Telephone Encounter (Signed)
I called the phone number listed in chart. A man answered and said that was the wrong residence. I am mailing a letter for pt to call.

## 2013-06-04 NOTE — Telephone Encounter (Signed)
Pt received letter and called. I updated her phone number in the chart [phone]).  I made her aware of Leslie's recommendations and she was not scheduled for MRI at her discharge.

## 2013-06-05 NOTE — Telephone Encounter (Signed)
I called and informed pt. She said OK to schedule the MRI.

## 2013-06-05 NOTE — Telephone Encounter (Signed)
She needs to follow up with whoever is monitoring her AAA (?Dr. Tinnie Gens) regarding abnormal CTA (occlusive arterial disease).  We can order the MRI Abd with and without contrast.  Dx: two indeterminate complex lesions of left kidney. Creatinine 0.44 on 05/16/13.  Please let patient know her ulcers were related to NSAIDs.   Needs follow up OV with Dr. Gala Romney in 3 months.

## 2013-06-06 ENCOUNTER — Other Ambulatory Visit: Payer: Self-pay | Admitting: Gastroenterology

## 2013-06-06 ENCOUNTER — Encounter: Payer: Self-pay | Admitting: Gastroenterology

## 2013-06-06 DIAGNOSIS — N289 Disorder of kidney and ureter, unspecified: Secondary | ICD-10-CM

## 2013-06-06 NOTE — Telephone Encounter (Signed)
Mri is scheduled for Tues June 9th at 9:00 am, patient is to be NPO 4 to 5 hrs prior and she needs to arrive at 8:45 at [facility]. I have LMOM for a return call from Jackie Walls

## 2013-06-06 NOTE — Telephone Encounter (Signed)
I have also mailed her a letter with date & time

## 2013-06-12 ENCOUNTER — Encounter (HOSPITAL_COMMUNITY): Payer: Self-pay

## 2013-06-12 ENCOUNTER — Ambulatory Visit (HOSPITAL_COMMUNITY)
Admission: RE | Admit: 2013-06-12 | Discharge: 2013-06-12 | Disposition: A | Discharge status: Home / Self Care | Payer: Medicare Other | Source: Ambulatory Visit | Attending: Gastroenterology | Admitting: Gastroenterology

## 2013-06-12 DIAGNOSIS — Q619 Cystic kidney disease, unspecified: Secondary | ICD-10-CM | POA: Insufficient documentation

## 2013-06-12 DIAGNOSIS — I714 Abdominal aortic aneurysm, without rupture, unspecified: Secondary | ICD-10-CM | POA: Insufficient documentation

## 2013-06-12 DIAGNOSIS — N289 Disorder of kidney and ureter, unspecified: Secondary | ICD-10-CM

## 2013-06-12 MED ORDER — GADOBENATE DIMEGLUMINE 529 MG/ML IV SOLN
10.0000 mL | Freq: Once | INTRAVENOUS | Status: AC | PRN
  Administered 2013-06-12: 10 mL via INTRAVENOUS

## 2013-06-12 MED ORDER — SODIUM CHLORIDE 0.9 % IV SOLN
INTRAVENOUS | Status: AC
  Filled 2013-06-12: qty 200

## 2013-06-21 NOTE — Progress Notes (Signed)
Quick Note:  Please let patient know she needs to f/u with her vascular specialist re: AAA. Her renal lesions are benign cysts and require no further w/u. OV as scheduled for next month. ______

## 2013-06-26 NOTE — Progress Notes (Signed)
Quick Note:  I called and informed pt. ______

## 2013-07-12 ENCOUNTER — Ambulatory Visit (HOSPITAL_COMMUNITY)
Admission: RE | Admit: 2013-07-12 | Discharge: 2013-07-12 | Disposition: A | Discharge status: Home / Self Care | Payer: Medicare Other | Source: Ambulatory Visit | Attending: Adult Health Nurse Practitioner | Admitting: Adult Health Nurse Practitioner

## 2013-07-12 ENCOUNTER — Other Ambulatory Visit (HOSPITAL_COMMUNITY): Payer: Self-pay | Admitting: Adult Health Nurse Practitioner

## 2013-07-12 DIAGNOSIS — M79609 Pain in unspecified limb: Secondary | ICD-10-CM | POA: Insufficient documentation

## 2013-07-12 DIAGNOSIS — R609 Edema, unspecified: Secondary | ICD-10-CM

## 2013-07-12 DIAGNOSIS — M7989 Other specified soft tissue disorders: Secondary | ICD-10-CM | POA: Insufficient documentation

## 2013-07-12 DIAGNOSIS — M79672 Pain in left foot: Secondary | ICD-10-CM

## 2013-07-14 ENCOUNTER — Emergency Department (HOSPITAL_COMMUNITY): Payer: Medicare Other

## 2013-07-14 ENCOUNTER — Emergency Department (HOSPITAL_COMMUNITY)
Admission: EM | Admit: 2013-07-14 | Discharge: 2013-07-14 | Disposition: A | Discharge status: Home / Self Care | Payer: Medicare Other | Attending: Emergency Medicine | Admitting: Emergency Medicine

## 2013-07-14 ENCOUNTER — Encounter (HOSPITAL_COMMUNITY): Payer: Self-pay | Admitting: Emergency Medicine

## 2013-07-14 DIAGNOSIS — Z8659 Personal history of other mental and behavioral disorders: Secondary | ICD-10-CM | POA: Insufficient documentation

## 2013-07-14 DIAGNOSIS — IMO0002 Reserved for concepts with insufficient information to code with codable children: Secondary | ICD-10-CM | POA: Insufficient documentation

## 2013-07-14 DIAGNOSIS — J441 Chronic obstructive pulmonary disease with (acute) exacerbation: Secondary | ICD-10-CM | POA: Insufficient documentation

## 2013-07-14 DIAGNOSIS — F17200 Nicotine dependence, unspecified, uncomplicated: Secondary | ICD-10-CM | POA: Insufficient documentation

## 2013-07-14 DIAGNOSIS — E785 Hyperlipidemia, unspecified: Secondary | ICD-10-CM | POA: Insufficient documentation

## 2013-07-14 DIAGNOSIS — Z792 Long term (current) use of antibiotics: Secondary | ICD-10-CM | POA: Insufficient documentation

## 2013-07-14 DIAGNOSIS — I10 Essential (primary) hypertension: Secondary | ICD-10-CM | POA: Insufficient documentation

## 2013-07-14 DIAGNOSIS — Z7983 Long term (current) use of bisphosphonates: Secondary | ICD-10-CM | POA: Insufficient documentation

## 2013-07-14 DIAGNOSIS — Z79899 Other long term (current) drug therapy: Secondary | ICD-10-CM | POA: Insufficient documentation

## 2013-07-14 MED ORDER — PREDNISONE 10 MG PO TABS: 20.0000 mg | ORAL_TABLET | Freq: Every day | ORAL | Status: DC

## 2013-07-14 MED ORDER — DOXYCYCLINE HYCLATE 100 MG PO CAPS: 100.0000 mg | ORAL_CAPSULE | Freq: Two times a day (BID) | ORAL | Status: DC

## 2013-07-14 MED ORDER — METHYLPREDNISOLONE SODIUM SUCC 125 MG IJ SOLR
60.0000 mg | Freq: Once | INTRAMUSCULAR | Status: AC
  Administered 2013-07-14: 60 mg via INTRAVENOUS
  Filled 2013-07-14: qty 2

## 2013-07-14 MED ORDER — ALBUTEROL SULFATE (2.5 MG/3ML) 0.083% IN NEBU
2.5000 mg | INHALATION_SOLUTION | Freq: Once | RESPIRATORY_TRACT | Status: AC
  Administered 2013-07-14: 2.5 mg via RESPIRATORY_TRACT
  Filled 2013-07-14: qty 3

## 2013-07-14 MED ORDER — IPRATROPIUM-ALBUTEROL 0.5-2.5 (3) MG/3ML IN SOLN
3.0000 mL | Freq: Once | RESPIRATORY_TRACT | Status: AC
  Administered 2013-07-14: 3 mL via RESPIRATORY_TRACT
  Filled 2013-07-14: qty 3

## 2013-07-14 NOTE — ED Provider Notes (Signed)
CSN: [id number]     Arrival date & time 07/14/13  1201 History   First MD Initiated Contact with Patient 07/14/13 1209     Chief Complaint  Patient presents with  . Shortness of Breath     (Consider location/radiation/quality/duration/timing/severity/associated sxs/prior Treatment) HPI... patient with long-standing COPD/cigarette smoker presents with wheezing, coughing, shortness of breath since last night. No fever, chills, rusty sputum. She has a home nebulizer machine. Has been on prednisone in the past. Severity is moderate. No chest pain. Exertion makes symptoms worse  Past Medical History  Diagnosis Date  . Hypertension   . Hyperlipidemia   . ALLERGIC RHINITIS   . Bronchitis   . COPD (chronic obstructive pulmonary disease)   . Abdominal aortic aneurysm   . Anxiety    Past Surgical History  Procedure Laterality Date  . Cesarean section    . Carotid endarterectomy Right   . Laparotomy N/A 04/25/2013    Procedure: EXPLORATORY LAPAROTOMY;  Surgeon: Jamesetta So, MD;  Location: [facility];  Service: General;  Laterality: N/A;  . Gastrorrhaphy N/A 04/25/2013    Procedure: Toniann Ket;  Surgeon: Jamesetta So, MD;  Location: [facility];  Service: General;  Laterality: N/A;  . Esophagogastroduodenoscopy N/A 05/15/2013    Procedure: ESOPHAGOGASTRODUODENOSCOPY (EGD);  Surgeon: Danie Binder, MD;  Location: [facility];  Service: Endoscopy;  Laterality: N/A;   Family History  Problem Relation Age of Onset  . Cancer Sister   . Colon cancer Neg Hx    History  Substance Use Topics  . Smoking status: Current Every Day Smoker -- 0.10 packs/day    Types: Cigarettes    Start date: 01/05/1948  . Smokeless tobacco: Not on file  . Alcohol Use: No   OB History   Grav Para Term Preterm Abortions TAB SAB Ect Mult Living                 Review of Systems  All other systems reviewed and are negative.     Allergies  Ibuprofen  Home Medications   Prior to Admission medications    Medication Sig Start Date End Date Taking? Authorizing Provider  albuterol-ipratropium (COMBIVENT) 18-103 MCG/ACT inhaler Inhale 1 puff into the lungs every 6 (six) hours as needed for wheezing or shortness of breath. 05/18/13  Yes Belkys A Regalado, MD  alendronate (FOSAMAX) 70 MG tablet Take 70 mg by mouth once a week. Take with a full glass of water on an empty stomach. Takes on Sat.   Yes Historical Provider, MD  amLODipine (NORVASC) 5 MG tablet Take 5 mg by mouth every morning.    Yes Historical Provider, MD  atorvastatin (LIPITOR) 40 MG tablet Take 40 mg by mouth daily.   Yes Historical Provider, MD  B Complex-C-Min-Fe-FA (HEMATINIC PLUS VIT/MINERALS PO) Take 1 tablet by mouth 2 (two) times daily.    Yes Historical Provider, MD  budesonide-formoterol (SYMBICORT) 160-4.5 MCG/ACT inhaler Inhale 2 puffs into the lungs 2 (two) times daily.     Yes Historical Provider, MD  calcium-vitamin D (OSCAL WITH D) 500-200 MG-UNIT per tablet Take 1 tablet by mouth 2 (two) times daily.   Yes Historical Provider, MD  cetirizine (ZYRTEC) 10 MG tablet Take 10 mg by mouth every morning.    Yes Historical Provider, MD  cholecalciferol (VITAMIN D) 1000 UNITS tablet Take 1,000 Units by mouth every evening.   Yes Historical Provider, MD  doxazosin (CARDURA) 4 MG tablet Take 4 mg by mouth every morning.  Yes Historical Provider, MD  feeding supplement, RESOURCE BREEZE, (RESOURCE BREEZE) LIQD Take 1 Container by mouth 2 (two) times daily between meals. 05/18/13  Yes Belkys A Regalado, MD  Fluticasone Furoate-Vilanterol (BREO ELLIPTA) 100-25 MCG/INH AEPB Inhale 1 Inhaler into the lungs daily.   Yes Historical Provider, MD  HYDROcodone-acetaminophen (NORCO/VICODIN) 5-325 MG per tablet Take 1 tablet by mouth every 4 (four) hours as needed for moderate pain.  07/10/13  Yes Historical Provider, MD  losartan (COZAAR) 50 MG tablet Take 100 mg by mouth every morning.    Yes Historical Provider, MD  pantoprazole (PROTONIX) 40 MG  tablet Take 40 mg by mouth 2 (two) times daily. 05/18/13  Yes Belkys A Regalado, MD  doxycycline (VIBRAMYCIN) 100 MG capsule Take 1 capsule (100 mg total) by mouth 2 (two) times daily. 07/14/13   Nat Christen, MD  predniSONE (DELTASONE) 10 MG tablet Take 2 tablets (20 mg total) by mouth daily. 07/14/13   Nat Christen, MD   BP 152/77  Pulse 91  Temp(Src) 98.1 F (36.7 C) (Oral)  Resp 20  Ht 5\' 5"  (1.651 m)  Wt 114 lb (51.71 kg)  BMI 18.97 kg/m2  SpO2 95% Physical Exam  Nursing note and vitals reviewed. Constitutional: She is oriented to person, place, and time. She appears well-developed and well-nourished.  No obvious dyspnea  HENT:  Head: Normocephalic and atraumatic.  Eyes: Conjunctivae and EOM are normal. Pupils are equal, round, and reactive to light.  Neck: Normal range of motion. Neck supple.  Cardiovascular: Normal rate, regular rhythm and normal heart sounds.   Pulmonary/Chest: Effort normal.  Bilateral expiratory wheezes  Abdominal: Soft. Bowel sounds are normal.  Musculoskeletal: Normal range of motion.  Neurological: She is alert and oriented to person, place, and time.  Skin: Skin is warm and dry.  Psychiatric: She has a normal mood and affect. Her behavior is normal.    ED Course  Procedures (including critical care time) Labs Review Labs Reviewed - No data to display  Imaging Review Dg Chest 2 View  07/14/2013   CLINICAL DATA:  Shortness of breath.  EXAM: CHEST  2 VIEW  COMPARISON:  Chest x-ray 05/13/2013.  FINDINGS: Mediastinum and hilar structures are normal. Mild pleural parenchymal scarring left lung base. No pleural effusion or pneumothorax. Heart size stable. Degenerative changes thoracic spine.  IMPRESSION: Mild pleural parenchymal scarring left lung base. No acute cardiopulmonary disease.   Electronically Signed   By: Marcello Moores  Register   On: 07/14/2013 14:21     EKG Interpretation   Date/Time:  Saturday July 14 2013 12:17:01 EDT Ventricular Rate:  105 PR  Interval:  125 QRS Duration: 82 QT Interval:  337 QTC Calculation: 445 R Axis:   56 Text Interpretation:  Sinus tachycardia Left ventricular hypertrophy  Borderline T abnormalities, lateral leads Confirmed by Lacinda Axon  MD, Nailah Luepke  ([id number]) on 07/14/2013 12:42:44 PM      MDM   Final diagnoses:  COPD exacerbation    Chest x-ray shows no pneumonia. Patient feels better after Albuterol/Atrovent breathing treatment, IV steroids.   Discharge medications doxycycline and prednisone.    Nat Christen, MD 07/14/13 1524

## 2013-07-14 NOTE — Discharge Instructions (Signed)
Chest x-ray shows no obvious pneumonia. Prescription for antibiotic and prednisone. Stop smoking. Use your inhaler. Followup your primary care Dr.

## 2013-07-14 NOTE — ED Notes (Signed)
Pt c/o SOB that began this morning. Pt reports hx of COPD. Pt states she had breathing tx at home this morning but denies relief. Pt denies chest pain, dizziness.

## 2013-07-14 NOTE — ED Notes (Signed)
Pt c/o sob that became worse during the night, has hx of copd and states that she woke up having trouble breathing, denies any pain, denies any change in cough,

## 2013-07-23 ENCOUNTER — Other Ambulatory Visit: Payer: Self-pay | Admitting: *Deleted

## 2013-07-23 DIAGNOSIS — M79672 Pain in left foot: Secondary | ICD-10-CM

## 2013-07-24 ENCOUNTER — Encounter: Payer: Self-pay | Admitting: Gastroenterology

## 2013-07-24 ENCOUNTER — Other Ambulatory Visit: Payer: Self-pay | Admitting: Internal Medicine

## 2013-07-24 ENCOUNTER — Ambulatory Visit (INDEPENDENT_AMBULATORY_CARE_PROVIDER_SITE_OTHER): Payer: Medicare Other | Admitting: Gastroenterology

## 2013-07-24 VITALS — BP 138/76 | HR 103 | Temp 97.4°F | Resp 16 | Ht 65.0 in | Wt 109.0 lb

## 2013-07-24 DIAGNOSIS — R195 Other fecal abnormalities: Secondary | ICD-10-CM

## 2013-07-24 DIAGNOSIS — K269 Duodenal ulcer, unspecified as acute or chronic, without hemorrhage or perforation: Secondary | ICD-10-CM

## 2013-07-24 NOTE — Patient Instructions (Signed)
Please complete the stool samples as soon as possible and return to the lab.   Please have blood work done today.   You have been scheduled for a colonoscopy and possible upper endoscopy with Dr. Gala Romney in the near future.   Start taking a probiotic daily. We have provided samples. Some examples are [employer]age, [employer], [employer] brand, Align, and Restora.

## 2013-07-24 NOTE — Progress Notes (Deleted)
Diarrhea since May 2015. Significant weight loss noted. Postprandial urgency. Incontinence. No prior colonoscopy. Prior to hospitalization would have BM once a day. Has bowel movements at night as well. No abdominal pain. States stool is black. Appetite is better. Feels weak, tired.   No recent abx.  Need to see vascular specialist.   +DOE, no chest pain, +palpitations. +back pain/hip pain.

## 2013-07-26 ENCOUNTER — Encounter (HOSPITAL_COMMUNITY): Payer: Self-pay | Admitting: Pharmacy Technician

## 2013-07-26 ENCOUNTER — Encounter: Payer: Self-pay | Admitting: Gastroenterology

## 2013-07-26 NOTE — Progress Notes (Signed)
Referring Provider: Dione Housekeeper, MD Primary Care Physician:  Sherrie Mustache, MD Primary GI: Dr. Gala Romney   Chief Complaint  Patient presents with  . Follow-up    HPI:   68 year old female with recent hospitalization in May due to acute on chronic anemia, evidence of duodenal ulceration, visible oozing vessel s/p bleeding control therapy on EGD. 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 H. Pylori serology negative. In April actually hospitalized with perforated duodenal ulcer s/p laparotomy. 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 Returns stating diarrhea since May 2015. Significant weight loss noted from the 120s to low 100s over past several months. Postprandial urgency. Incontinence. No prior colonoscopy. Prior to hospitalization would have BM once a day. Has bowel movements at night as well. No abdominal pain. States stool is black. Appetite is better. Feels weak, tired.   No recent abx. Need to see vascular specialist due to severe atherosclerotic disease.   Past Medical History  Diagnosis Date  . Hypertension   . Hyperlipidemia   . ALLERGIC RHINITIS   . Bronchitis   . COPD (chronic obstructive pulmonary disease)   . Abdominal aortic aneurysm   . Anxiety     Past Surgical History  Procedure Laterality Date  . Cesarean section    . Carotid endarterectomy Right   . Laparotomy N/A 04/25/2013    Procedure: EXPLORATORY LAPAROTOMY;  Surgeon: Jamesetta So, MD;  Location: [facility];  Service: General;  Laterality: N/A;  . Gastrorrhaphy N/A 04/25/2013    Procedure: Toniann Ket;  Surgeon: Jamesetta So, MD;  Location: [facility];  Service: General;  Laterality: N/A;  . Esophagogastroduodenoscopy N/A 05/15/2013    Dr. Oneida Alar: moderate erosive gastritis, duodenal ulcers, visible vessel with active oozing s/p bleeding control therapy, NSAID induced gastritis    Current Outpatient Prescriptions  Medication Sig Dispense Refill  . albuterol-ipratropium (COMBIVENT) 18-103  MCG/ACT inhaler Inhale 1 puff into the lungs every 6 (six) hours as needed for wheezing or shortness of breath.  1 Inhaler  0  . alendronate (FOSAMAX) 70 MG tablet Take 70 mg by mouth once a week. Take with a full glass of water on an empty stomach. Takes on Sat.      Marland Kitchen amLODipine (NORVASC) 5 MG tablet Take 5 mg by mouth every morning.       Marland Kitchen atorvastatin (LIPITOR) 40 MG tablet Take 40 mg by mouth daily.      . benzonatate (TESSALON) 100 MG capsule Take 100 mg by mouth 3 (three) times daily as needed for cough.      . budesonide-formoterol (SYMBICORT) 160-4.5 MCG/ACT inhaler Inhale 2 puffs into the lungs 2 (two) times daily.        . calcium-vitamin D (OSCAL WITH D) 500-200 MG-UNIT per tablet Take 1 tablet by mouth 2 (two) times daily.      . cetirizine (ZYRTEC) 10 MG tablet Take 10 mg by mouth every morning.       . cholecalciferol (VITAMIN D) 1000 UNITS tablet Take 1,000 Units by mouth every evening.      Marland Kitchen doxazosin (CARDURA) 4 MG tablet Take 4 mg by mouth every morning.       . Fluticasone Furoate-Vilanterol (BREO ELLIPTA) 100-25 MCG/INH AEPB Inhale 1 Inhaler into the lungs daily.      Marland Kitchen losartan (COZAAR) 50 MG tablet Take 100 mg by mouth every morning.       . pantoprazole (PROTONIX) 40 MG tablet Take 40 mg by mouth 2 (two) times daily.  No current facility-administered medications for this visit.    Allergies as of 07/24/2013 - Review Complete 07/24/2013  Allergen Reaction Noted  . Ibuprofen Other (See Comments) 05/15/2013    Family History  Problem Relation Age of Onset  . Cancer Sister   . Colon cancer Neg Hx     History   Social History  . Marital Status: Married    Spouse Name: N/A    Number of Children: N/A  . Years of Education: N/A   Social History Main Topics  . Smoking status: Current Every Day Smoker -- 0.50 packs/day    Types: Cigarettes    Start date: 01/05/1948  . Smokeless tobacco: None  . Alcohol Use: No  . Drug Use: No  . Sexual Activity: None    Other Topics Concern  . None   Social History Narrative   Lives with her husband at home.    Review of Systems:  Gen: see HPI CV: +palpitations Resp: +DOE GI: +back pain/hip pain Derm: Denies rash, itching, dry skin Psych: Denies depression, anxiety, memory loss, confusion. No homicidal or suicidal ideation.  Heme: Denies bruising, bleeding, and enlarged lymph nodes.  Physical Exam: BP 138/76  Pulse 103  Temp(Src) 97.4 F (36.3 C) (Oral)  Resp 16  Ht 5\' 5"  (1.651 m)  Wt 109 lb (49.442 kg)  BMI 18.14 kg/m2 General:   Alert and oriented. No distress noted. Thin Head:  Normocephalic and atraumatic. Eyes:  Conjuctiva clear without scleral icterus. Mouth:  Oral mucosa pink and moist.  Heart:  S1, S2 present without murmurs Lungs: Wheezing bilaterally but without distress Abdomen:  +BS, soft, non-tender and non-distended. No rebound or guarding. No HSM or masses noted. Msk:  Symmetrical without gross deformities. Normal posture. Extremities:  Without edema. Neurologic:  Alert and  oriented x4;  grossly normal neurologically. Skin:  Intact without significant lesions or rashes. Psych:  Alert and cooperative. Normal mood and affect.  Lab Results  Component Value Date   WBC 7.9 05/17/2013   HGB 9.3* 05/17/2013   HCT 27.5* 05/17/2013   MCV 91.4 05/17/2013   PLT 305 05/17/2013

## 2013-07-29 DIAGNOSIS — K269 Duodenal ulcer, unspecified as acute or chronic, without hemorrhage or perforation: Secondary | ICD-10-CM | POA: Insufficient documentation

## 2013-07-29 DIAGNOSIS — R195 Other fecal abnormalities: Secondary | ICD-10-CM | POA: Insufficient documentation

## 2013-07-29 NOTE — Assessment & Plan Note (Signed)
68 year old female with notable change in bowel habits, now with diarrhea, postprandial urgency, incontinence, and associated weight loss. No prior colonoscopy. Ultimately needs lower GI evaluation in way of colonoscopy; will proceed with stool studies in interim.   Proceed with TCS with Dr. Gala Romney in near future: the risks, benefits, and alternatives have been discussed with the patient in detail. The patient states understanding and desires to proceed. Stool studies Add probiotic

## 2013-07-29 NOTE — Assessment & Plan Note (Signed)
Hx of perforated duodenal ulcer s/p laparotomy in April 2015, with recent hospitalization in May due to acute on chronic anemia, evidence of duodenal ulceration, visible oozing vessel s/p bleeding control therapy on EGD. 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 H. Pylori serology negative. Ulcer secondary to NSAIDs. Possible report of recent black stool. Doubt true melena. CBC now. Possible EGD at time of TCS if evidence of persistent anemia or any further possibility of melena.

## 2013-07-31 LAB — CLOSTRIDIUM DIFFICILE BY PCR: CDIFFPCR: NOT DETECTED

## 2013-07-31 NOTE — Progress Notes (Signed)
cc'd to pcp 

## 2013-08-01 LAB — GIARDIA ANTIGEN: Giardia Screen (EIA): NEGATIVE

## 2013-08-04 LAB — STOOL CULTURE

## 2013-08-07 LAB — CBC WITH DIFFERENTIAL/PLATELET
BASOS PCT: 0 % (ref 0–1)
Basophils Absolute: 0 10*3/uL (ref 0.0–0.1)
EOS ABS: 0.1 10*3/uL (ref 0.0–0.7)
Eosinophils Relative: 1 % (ref 0–5)
HCT: 35.1 % — ABNORMAL LOW (ref 36.0–46.0)
Hemoglobin: 11.9 g/dL — ABNORMAL LOW (ref 12.0–15.0)
Lymphocytes Relative: 18 % (ref 12–46)
Lymphs Abs: 1.4 10*3/uL (ref 0.7–4.0)
MCH: 30.7 pg (ref 26.0–34.0)
MCHC: 33.9 g/dL (ref 30.0–36.0)
MCV: 90.5 fL (ref 78.0–100.0)
MONO ABS: 0.5 10*3/uL (ref 0.1–1.0)
Monocytes Relative: 6 % (ref 3–12)
NEUTROS PCT: 75 % (ref 43–77)
Neutro Abs: 5.6 10*3/uL (ref 1.7–7.7)
Platelets: 309 10*3/uL (ref 150–400)
RBC: 3.88 MIL/uL (ref 3.87–5.11)
RDW: 15.3 % (ref 11.5–15.5)
WBC: 7.5 10*3/uL (ref 4.0–10.5)

## 2013-08-07 NOTE — Progress Notes (Signed)
Quick Note:  Hgb improved from admission. 11.9 now.  Negative stool studies.  TCS+/-EGD as planned. ______

## 2013-08-13 ENCOUNTER — Encounter: Payer: Self-pay | Admitting: Vascular Surgery

## 2013-08-13 ENCOUNTER — Ambulatory Visit (HOSPITAL_COMMUNITY)
Admission: RE | Admit: 2013-08-13 | Discharge: 2013-08-13 | Disposition: A | Discharge status: Home / Self Care | Payer: Medicare Other | Source: Ambulatory Visit | Attending: Internal Medicine | Admitting: Internal Medicine

## 2013-08-13 ENCOUNTER — Encounter (HOSPITAL_COMMUNITY)
Admission: RE | Disposition: A | Discharge status: Home / Self Care | Payer: Self-pay | Source: Ambulatory Visit | Attending: Internal Medicine

## 2013-08-13 ENCOUNTER — Encounter (HOSPITAL_COMMUNITY): Payer: Self-pay | Admitting: *Deleted

## 2013-08-13 DIAGNOSIS — J309 Allergic rhinitis, unspecified: Secondary | ICD-10-CM | POA: Diagnosis not present

## 2013-08-13 DIAGNOSIS — Z79899 Other long term (current) drug therapy: Secondary | ICD-10-CM | POA: Insufficient documentation

## 2013-08-13 DIAGNOSIS — Z86010 Personal history of colonic polyps: Secondary | ICD-10-CM

## 2013-08-13 DIAGNOSIS — F411 Generalized anxiety disorder: Secondary | ICD-10-CM | POA: Insufficient documentation

## 2013-08-13 DIAGNOSIS — Z886 Allergy status to analgesic agent status: Secondary | ICD-10-CM | POA: Insufficient documentation

## 2013-08-13 DIAGNOSIS — K5289 Other specified noninfective gastroenteritis and colitis: Secondary | ICD-10-CM | POA: Diagnosis not present

## 2013-08-13 DIAGNOSIS — J449 Chronic obstructive pulmonary disease, unspecified: Secondary | ICD-10-CM | POA: Diagnosis not present

## 2013-08-13 DIAGNOSIS — I714 Abdominal aortic aneurysm, without rupture, unspecified: Secondary | ICD-10-CM | POA: Diagnosis not present

## 2013-08-13 DIAGNOSIS — F17200 Nicotine dependence, unspecified, uncomplicated: Secondary | ICD-10-CM | POA: Diagnosis not present

## 2013-08-13 DIAGNOSIS — I10 Essential (primary) hypertension: Secondary | ICD-10-CM | POA: Insufficient documentation

## 2013-08-13 DIAGNOSIS — J4489 Other specified chronic obstructive pulmonary disease: Secondary | ICD-10-CM | POA: Insufficient documentation

## 2013-08-13 DIAGNOSIS — R197 Diarrhea, unspecified: Secondary | ICD-10-CM | POA: Diagnosis present

## 2013-08-13 DIAGNOSIS — K269 Duodenal ulcer, unspecified as acute or chronic, without hemorrhage or perforation: Secondary | ICD-10-CM

## 2013-08-13 DIAGNOSIS — E785 Hyperlipidemia, unspecified: Secondary | ICD-10-CM | POA: Diagnosis not present

## 2013-08-13 DIAGNOSIS — R195 Other fecal abnormalities: Secondary | ICD-10-CM

## 2013-08-13 DIAGNOSIS — D126 Benign neoplasm of colon, unspecified: Secondary | ICD-10-CM | POA: Diagnosis not present

## 2013-08-13 HISTORY — PX: ESOPHAGOGASTRODUODENOSCOPY: SHX5428

## 2013-08-13 HISTORY — PX: COLONOSCOPY: SHX5424

## 2013-08-13 SURGERY — COLONOSCOPY
Anesthesia: Moderate Sedation

## 2013-08-13 MED ORDER — MEPERIDINE HCL 100 MG/ML IJ SOLN
INTRAMUSCULAR | Status: DC | PRN
  Administered 2013-08-13 (×3): 25 mg via INTRAVENOUS

## 2013-08-13 MED ORDER — SODIUM CHLORIDE 0.9 % IV SOLN
INTRAVENOUS | Status: DC
  Administered 2013-08-13: 1000 mL via INTRAVENOUS

## 2013-08-13 MED ORDER — LIDOCAINE VISCOUS 2 % MT SOLN
OROMUCOSAL | Status: DC | PRN
  Administered 2013-08-13: 1 via OROMUCOSAL

## 2013-08-13 MED ORDER — MIDAZOLAM HCL 5 MG/5ML IJ SOLN
INTRAMUSCULAR | Status: DC | PRN
Start: 2013-08-13 — End: 2013-08-13
  Administered 2013-08-13: 1 mg via INTRAVENOUS
  Administered 2013-08-13 (×2): 2 mg via INTRAVENOUS
  Administered 2013-08-13: 1 mg via INTRAVENOUS

## 2013-08-13 MED ORDER — ONDANSETRON HCL 4 MG/2ML IJ SOLN
INTRAMUSCULAR | Status: DC | PRN
  Administered 2013-08-13: 4 mg via INTRAVENOUS

## 2013-08-13 MED ORDER — MIDAZOLAM HCL 5 MG/5ML IJ SOLN
INTRAMUSCULAR | Status: AC
  Filled 2013-08-13: qty 10

## 2013-08-13 MED ORDER — LIDOCAINE VISCOUS 2 % MT SOLN
OROMUCOSAL | Status: AC
  Filled 2013-08-13: qty 15

## 2013-08-13 MED ORDER — MEPERIDINE HCL 100 MG/ML IJ SOLN
INTRAMUSCULAR | Status: AC
  Filled 2013-08-13: qty 2

## 2013-08-13 MED ORDER — STERILE WATER FOR IRRIGATION IR SOLN
Status: DC | PRN
  Administered 2013-08-13: 11:00:00

## 2013-08-13 MED ORDER — ONDANSETRON HCL 4 MG/2ML IJ SOLN
INTRAMUSCULAR | Status: AC
  Filled 2013-08-13: qty 2

## 2013-08-13 NOTE — Interval H&P Note (Signed)
History and Physical Interval Note:  08/13/2013 11:17 AM  [name]  has presented today for surgery, with the diagnosis of LOOSE STOOLS AND DUODENAL ULCER  The various methods of treatment have been discussed with the patient and family. After consideration of risks, benefits and other options for treatment, the patient has consented to  Procedure(s) with comments: COLONOSCOPY (N/A) - 10;00 ESOPHAGOGASTRODUODENOSCOPY (EGD) (N/A) as a surgical intervention .  The patient's history has been reviewed, patient examined, no change in status, stable for surgery.  I have reviewed the patient's chart and labs.  Questions were answered to the patient's satisfaction.     Jackie Walls  Stool studies negative. Hemoglobin 11.9. No report of melena. Colonoscopy today for loose stools and in part for screening.  @LOGO @   Primary Care Physician:  Sherrie Mustache, MD Primary Gastroenterologist:  Dr.   Pre-Procedure History & Physical: HPI:  Jackie Walls is a 68 y.o. female is here for a screening colonoscopy.   Past Medical History  Diagnosis Date  . Hypertension   . Hyperlipidemia   . ALLERGIC RHINITIS   . Bronchitis   . COPD (chronic obstructive pulmonary disease)   . Abdominal aortic aneurysm   . Anxiety     Past Surgical History  Procedure Laterality Date  . Cesarean section    . Carotid endarterectomy Right   . Laparotomy N/A 04/25/2013    Procedure: EXPLORATORY LAPAROTOMY;  Surgeon: Jamesetta So, MD;  Location: [facility];  Service: General;  Laterality: N/A;  . Gastrorrhaphy N/A 04/25/2013    Procedure: Toniann Ket;  Surgeon: Jamesetta So, MD;  Location: [facility];  Service: General;  Laterality: N/A;  . Esophagogastroduodenoscopy N/A 05/15/2013    Dr. Oneida Alar: moderate erosive gastritis, duodenal ulcers, visible vessel with active oozing s/p bleeding control therapy, NSAID induced gastritis    Prior to Admission medications   Medication Sig Start Date End Date Taking?  Authorizing Provider  alendronate (FOSAMAX) 70 MG tablet Take 70 mg by mouth once a week. Take with a full glass of water on an empty stomach. Takes on Sat.   Yes Historical Provider, MD  amLODipine (NORVASC) 5 MG tablet Take 5 mg by mouth every morning.    Yes Historical Provider, MD  atorvastatin (LIPITOR) 40 MG tablet Take 40 mg by mouth daily.   Yes Historical Provider, MD  budesonide-formoterol (SYMBICORT) 160-4.5 MCG/ACT inhaler Inhale 2 puffs into the lungs 2 (two) times daily.     Yes Historical Provider, MD  calcium-vitamin D (OSCAL WITH D) 500-200 MG-UNIT per tablet Take 1 tablet by mouth 2 (two) times daily.   Yes Historical Provider, MD  cetirizine (ZYRTEC) 10 MG tablet Take 10 mg by mouth every morning.    Yes Historical Provider, MD  cholecalciferol (VITAMIN D) 1000 UNITS tablet Take 1,000 Units by mouth every evening.   Yes Historical Provider, MD  doxazosin (CARDURA) 4 MG tablet Take 4 mg by mouth every morning.    Yes Historical Provider, MD  Fluticasone Furoate-Vilanterol (BREO ELLIPTA) 100-25 MCG/INH AEPB Inhale 1 Inhaler into the lungs daily.   Yes Historical Provider, MD  losartan (COZAAR) 50 MG tablet Take 100 mg by mouth every morning.    Yes Historical Provider, MD  pantoprazole (PROTONIX) 40 MG tablet Take 40 mg by mouth 2 (two) times daily. 05/18/13  Yes Belkys A Regalado, MD  albuterol-ipratropium (COMBIVENT) 18-103 MCG/ACT inhaler Inhale 1 puff into the lungs every 6 (six) hours as needed for wheezing or shortness of  breath. 05/18/13   Belkys A Regalado, MD  benzonatate (TESSALON) 100 MG capsule Take 100 mg by mouth 3 (three) times daily as needed for cough.    Historical Provider, MD    Allergies as of 07/24/2013 - Review Complete 07/24/2013  Allergen Reaction Noted  . Ibuprofen Other (See Comments) 05/15/2013    Family History  Problem Relation Age of Onset  . Cancer Sister   . Colon cancer Neg Hx     History   Social History  . Marital Status: Married     Spouse Name: N/A    Number of Children: N/A  . Years of Education: N/A   Occupational History  . Not on file.   Social History Main Topics  . Smoking status: Current Every Day Smoker -- 0.25 packs/day    Types: Cigarettes    Start date: 01[birth year]05/1948  . Smokeless tobacco: Not on file  . Alcohol Use: No  . Drug Use: No  . Sexual Activity: Not on file   Other Topics Concern  . Not on file   Social History Narrative   Lives with her husband at home.    Review of Systems: See HPI, otherwise negative ROS  Physical Exam: BP 186/94  Pulse 95  Temp(Src) 97.8 F (36.6 C) (Oral)  Resp 21  SpO2 98% General:   Alert,  Well-developed, well-nourished, pleasant and cooperative in NAD Head:  Normocephalic and atraumatic. Eyes:  Sclera clear, no icterus.   Conjunctiva pink. Ears:  Normal auditory acuity. Nose:  No deformity, discharge,  or lesions. Mouth:  No deformity or lesions, dentition normal. Neck:  Supple; no masses or thyromegaly. Lungs:  Clear throughout to auscultation.   No wheezes, crackles, or rhonchi. No acute distress. Heart:  Regular rate and rhythm; no murmurs, clicks, rubs,  or gallops. Abdomen:  Soft, nontender and nondistended. No masses, hepatosplenomegaly or hernias noted. Normal bowel sounds, without guarding, and without rebound.   Msk:  Symmetrical without gross deformities. Normal posture. Pulses:  Normal pulses noted. Extremities:  Without clubbing or edema. Neurologic:  Alert and  oriented x4;  grossly normal neurologically. Skin:  Intact without significant lesions or rashes. Cervical Nodes:  No significant cervical adenopathy. Psych:  Alert and cooperative. Normal mood and affect.  Impression/Plan: [name] is now here to undergo a screening colonoscopy.  Risks, benefits, limitations, imponderables and alternatives regarding colonoscopy have been reviewed with the patient. Questions have been answered. All parties agreeable.     Notice:  This  dictation was prepared with Dragon dictation along with smaller phrase technology. Any transcriptional errors that result from this process are unintentional and may not be corrected upon review.

## 2013-08-13 NOTE — Discharge Instructions (Addendum)
°Colonoscopy °Discharge Instructions ° °Read the instructions outlined below and refer to this sheet in the next few weeks. These discharge instructions provide you with general information on caring for yourself after you leave the hospital. Your doctor may also give you specific instructions. While your treatment has been planned according to the most current medical practices available, unavoidable complications occasionally occur. If you have any problems or questions after discharge, call Dr. Rourk at [phone]. °ACTIVITY °· You may resume your regular activity, but move at a slower pace for the next 24 hours.  °· Take frequent rest periods for the next 24 hours.  °· Walking will help get rid of the air and reduce the bloated feeling in your belly (abdomen).  °· No driving for 24 hours (because of the medicine (anesthesia) used during the test).   °· Do not sign any important legal documents or operate any machinery for 24 hours (because of the anesthesia used during the test).  °NUTRITION °· Drink plenty of fluids.  °· You may resume your normal diet as instructed by your doctor.  °· Begin with a light meal and progress to your normal diet. Heavy or fried foods are harder to digest and may make you feel sick to your stomach (nauseated).  °· Avoid alcoholic beverages for 24 hours or as instructed.  °MEDICATIONS °· You may resume your normal medications unless your doctor tells you otherwise.  °WHAT YOU CAN EXPECT TODAY °· Some feelings of bloating in the abdomen.  °· Passage of more gas than usual.  °· Spotting of blood in your stool or on the toilet paper.  °IF YOU HAD POLYPS REMOVED DURING THE COLONOSCOPY: °· No aspirin products for 7 days or as instructed.  °· No alcohol for 7 days or as instructed.  °· Eat a soft diet for the next 24 hours.  °FINDING OUT THE RESULTS OF YOUR TEST °Not all test results are available during your visit. If your test results are not back during the visit, make an appointment  with your caregiver to find out the results. Do not assume everything is normal if you have not heard from your caregiver or the medical facility. It is important for you to follow up on all of your test results.  °SEEK IMMEDIATE MEDICAL ATTENTION IF: °· You have more than a spotting of blood in your stool.  °· Your belly is swollen (abdominal distention).  °· You are nauseated or vomiting.  °· You have a temperature over 101.  °· You have abdominal pain or discomfort that is severe or gets worse throughout the day.  ° ° °Polyp information provided ° °Further recommendations to follow pending review of pathology report ° °Colon Polyps °Polyps are lumps of extra tissue growing inside the body. Polyps can grow in the large intestine (colon). Most colon polyps are noncancerous (benign). However, some colon polyps can become cancerous over time. Polyps that are larger than a pea may be harmful. To be safe, caregivers remove and test all polyps. °CAUSES  °Polyps form when mutations in the genes cause your cells to grow and divide even though no more tissue is needed. °RISK FACTORS °There are a number of risk factors that can increase your chances of getting colon polyps. They include: °· Being older than 50 years. °· Family history of colon polyps or colon cancer. °· Long-term colon diseases, such as colitis or Crohn disease. °· Being overweight. °· Smoking. °· Being inactive. °· Drinking too much alcohol. °SYMPTOMS  °  Most small polyps do not cause symptoms. If symptoms are present, they may include: °· Blood in the stool. The stool may look dark red or black. °· Constipation or diarrhea that lasts longer than 1 week. °DIAGNOSIS °People often do not know they have polyps until their caregiver finds them during a regular checkup. Your caregiver can use 4 tests to check for polyps: °· Digital rectal exam. The caregiver wears gloves and feels inside the rectum. This test would find polyps only in the rectum. °· Barium  enema. The caregiver puts a liquid called barium into your rectum before taking X-rays of your colon. Barium makes your colon look white. Polyps are dark, so they are easy to see in the X-ray pictures. °· Sigmoidoscopy. A thin, flexible tube (sigmoidoscope) is placed into your rectum. The sigmoidoscope has a light and tiny camera in it. The caregiver uses the sigmoidoscope to look at the last third of your colon. °· Colonoscopy. This test is like sigmoidoscopy, but the caregiver looks at the entire colon. This is the most common method for finding and removing polyps. °TREATMENT  °Any polyps will be removed during a sigmoidoscopy or colonoscopy. The polyps are then tested for cancer. °PREVENTION  °To help lower your risk of getting more colon polyps: °· Eat plenty of fruits and vegetables. Avoid eating fatty foods. °· Do not smoke. °· Avoid drinking alcohol. °· Exercise every day. °· Lose weight if recommended by your caregiver. °· Eat plenty of calcium and folate. Foods that are rich in calcium include milk, cheese, and broccoli. Foods that are rich in folate include chickpeas, kidney beans, and spinach. °HOME CARE INSTRUCTIONS °Keep all follow-up appointments as directed by your caregiver. You may need periodic exams to check for polyps. °SEEK MEDICAL CARE IF: °You notice bleeding during a bowel movement. °Document Released: 09/17/2003 Document Revised: 03/15/2011 Document Reviewed: 03/02/2011 °ExitCare® Patient Information ©2015 [employer]. This information is not intended to replace advice given to you by your health care provider. Make sure you discuss any questions you have with your health care provider. ° °

## 2013-08-13 NOTE — Op Note (Signed)
[facility] [address] [place], [address]   COLONOSCOPY PROCEDURE REPORT  PATIENT: Walls, Jackie  MR#:         [id number] BIRTHDATE: 26-Aug-1945 , 45  yrs. old GENDER: Female ENDOSCOPIST: R.  Garfield Cornea, MD FACP FACG REFERRED BY:  Dione Housekeeper, M.D. PROCEDURE DATE:  08/13/2013 PROCEDURE:     Ileocolonoscopy with segmental biopsy  INDICATIONS: Chronic diarrhea; first ever colorectal cancer screening examination  INFORMED CONSENT:  The risks, benefits, alternatives and imponderables including but not limited to bleeding, perforation as well as the possibility of a missed lesion have been reviewed.  The potential for biopsy, lesion removal, etc. have also been discussed.  Questions have been answered.  All parties agreeable. Please see the history and physical in the medical record for more information.  MEDICATIONS: Versed 6 mg IV and Demerol 75 mg IV in divided doses. Zofran 4 mg IV.  DESCRIPTION OF PROCEDURE:  After a digital rectal exam was performed, the [id number] ([id number])  colonoscope was advanced from the anus through the rectum and colon to the area of the cecum, ileocecal valve and appendiceal orifice.  The cecum was deeply intubated.  These structures were well-seen and photographed for the record.  From the level of the cecum and ileocecal valve, the scope was slowly and cautiously withdrawn.  The mucosal surfaces were carefully surveyed utilizing scope tip deflection to facilitate fold flattening as needed.  The scope was pulled down into the rectum where a thorough examination including retroflexion was performed.    FINDINGS:  Adequate preparation. Normal rectum. Slightly granular colonic mucosa diffusely. No ulcer or erosion. (2) diminutive polyps in the mid ascending segment; otherwise, mucosa appeared unremarkable. The distal 5 cm of terminal ileal mucosa also appeared normal.  THERAPEUTIC / DIAGNOSTIC MANEUVERS PERFORMED:   the 2 above-mentioned polyps for cold biopsy removed. Segmental biopsies of the ascending and descending/sigmoid segments taken for histologic study  COMPLICATIONS: none  CECAL WITHDRAWAL TIME:  11 minutes  IMPRESSION: Colonic polyps-removed as described above. Subtly abnormal colonic mucosa-status post segmental biopsy.   EGD not needed and not done.  RECOMMENDATIONS: Followup on pathology.   _______________________________ eSigned:  R. Garfield Cornea, MD FACP [facility] 08/13/2013 11:55 AM   CC:

## 2013-08-13 NOTE — H&P (View-Only) (Signed)
Referring Provider: Dione Housekeeper, MD Primary Care Physician:  Sherrie Mustache, MD Primary GI: Dr. Gala Romney   Chief Complaint  Patient presents with  . Follow-up    HPI:   68 year old female with recent hospitalization in May due to acute on chronic anemia, evidence of duodenal ulceration, visible oozing vessel s/p bleeding control therapy on EGD. 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 H. Pylori serology negative. In April actually hospitalized with perforated duodenal ulcer s/p laparotomy. 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 Returns stating diarrhea since May 2015. Significant weight loss noted from the 120s to low 100s over past several months. Postprandial urgency. Incontinence. No prior colonoscopy. Prior to hospitalization would have BM once a day. Has bowel movements at night as well. No abdominal pain. States stool is black. Appetite is better. Feels weak, tired.   No recent abx. Need to see vascular specialist due to severe atherosclerotic disease.   Past Medical History  Diagnosis Date  . Hypertension   . Hyperlipidemia   . ALLERGIC RHINITIS   . Bronchitis   . COPD (chronic obstructive pulmonary disease)   . Abdominal aortic aneurysm   . Anxiety     Past Surgical History  Procedure Laterality Date  . Cesarean section    . Carotid endarterectomy Right   . Laparotomy N/A 04/25/2013    Procedure: EXPLORATORY LAPAROTOMY;  Surgeon: Jamesetta So, MD;  Location: [facility];  Service: General;  Laterality: N/A;  . Gastrorrhaphy N/A 04/25/2013    Procedure: Toniann Ket;  Surgeon: Jamesetta So, MD;  Location: [facility];  Service: General;  Laterality: N/A;  . Esophagogastroduodenoscopy N/A 05/15/2013    Dr. Oneida Alar: moderate erosive gastritis, duodenal ulcers, visible vessel with active oozing s/p bleeding control therapy, NSAID induced gastritis    Current Outpatient Prescriptions  Medication Sig Dispense Refill  . albuterol-ipratropium (COMBIVENT) 18-103  MCG/ACT inhaler Inhale 1 puff into the lungs every 6 (six) hours as needed for wheezing or shortness of breath.  1 Inhaler  0  . alendronate (FOSAMAX) 70 MG tablet Take 70 mg by mouth once a week. Take with a full glass of water on an empty stomach. Takes on Sat.      Marland Kitchen amLODipine (NORVASC) 5 MG tablet Take 5 mg by mouth every morning.       Marland Kitchen atorvastatin (LIPITOR) 40 MG tablet Take 40 mg by mouth daily.      . benzonatate (TESSALON) 100 MG capsule Take 100 mg by mouth 3 (three) times daily as needed for cough.      . budesonide-formoterol (SYMBICORT) 160-4.5 MCG/ACT inhaler Inhale 2 puffs into the lungs 2 (two) times daily.        . calcium-vitamin D (OSCAL WITH D) 500-200 MG-UNIT per tablet Take 1 tablet by mouth 2 (two) times daily.      . cetirizine (ZYRTEC) 10 MG tablet Take 10 mg by mouth every morning.       . cholecalciferol (VITAMIN D) 1000 UNITS tablet Take 1,000 Units by mouth every evening.      Marland Kitchen doxazosin (CARDURA) 4 MG tablet Take 4 mg by mouth every morning.       . Fluticasone Furoate-Vilanterol (BREO ELLIPTA) 100-25 MCG/INH AEPB Inhale 1 Inhaler into the lungs daily.      Marland Kitchen losartan (COZAAR) 50 MG tablet Take 100 mg by mouth every morning.       . pantoprazole (PROTONIX) 40 MG tablet Take 40 mg by mouth 2 (two) times daily.  No current facility-administered medications for this visit.    Allergies as of 07/24/2013 - Review Complete 07/24/2013  Allergen Reaction Noted  . Ibuprofen Other (See Comments) 05/15/2013    Family History  Problem Relation Age of Onset  . Cancer Sister   . Colon cancer Neg Hx     History   Social History  . Marital Status: Married    Spouse Name: N/A    Number of Children: N/A  . Years of Education: N/A   Social History Main Topics  . Smoking status: Current Every Day Smoker -- 0.50 packs/day    Types: Cigarettes    Start date: 01/05/1948  . Smokeless tobacco: None  . Alcohol Use: No  . Drug Use: No  . Sexual Activity: None    Other Topics Concern  . None   Social History Narrative   Lives with her husband at home.    Review of Systems:  Gen: see HPI CV: +palpitations Resp: +DOE GI: +back pain/hip pain Derm: Denies rash, itching, dry skin Psych: Denies depression, anxiety, memory loss, confusion. No homicidal or suicidal ideation.  Heme: Denies bruising, bleeding, and enlarged lymph nodes.  Physical Exam: BP 138/76  Pulse 103  Temp(Src) 97.4 F (36.3 C) (Oral)  Resp 16  Ht 5\' 5"  (1.651 m)  Wt 109 lb (49.442 kg)  BMI 18.14 kg/m2 General:   Alert and oriented. No distress noted. Thin Head:  Normocephalic and atraumatic. Eyes:  Conjuctiva clear without scleral icterus. Mouth:  Oral mucosa pink and moist.  Heart:  S1, S2 present without murmurs Lungs: Wheezing bilaterally but without distress Abdomen:  +BS, soft, non-tender and non-distended. No rebound or guarding. No HSM or masses noted. Msk:  Symmetrical without gross deformities. Normal posture. Extremities:  Without edema. Neurologic:  Alert and  oriented x4;  grossly normal neurologically. Skin:  Intact without significant lesions or rashes. Psych:  Alert and cooperative. Normal mood and affect.  Lab Results  Component Value Date   WBC 7.9 05/17/2013   HGB 9.3* 05/17/2013   HCT 27.5* 05/17/2013   MCV 91.4 05/17/2013   PLT 305 05/17/2013

## 2013-08-14 ENCOUNTER — Encounter: Payer: Medicare Other | Admitting: Vascular Surgery

## 2013-08-14 ENCOUNTER — Encounter (HOSPITAL_COMMUNITY): Payer: Medicare Other

## 2013-08-15 ENCOUNTER — Encounter: Payer: Self-pay | Admitting: Internal Medicine

## 2013-08-15 ENCOUNTER — Telehealth: Payer: Self-pay

## 2013-08-15 NOTE — Telephone Encounter (Signed)
Letter from: Daneil Dolin    Reason for Letter: Results Review    Send letter to patient.  Send copy of letter with path to referring provider and PCP.   Needs office visit with extender in 6-8 weeks   Need to start Entocort 6 mg daily 30 days worth with one refill that's (2) 3 mg tablets daily)

## 2013-08-17 ENCOUNTER — Encounter (HOSPITAL_COMMUNITY): Payer: Self-pay | Admitting: Internal Medicine

## 2013-08-17 NOTE — Telephone Encounter (Signed)
Tried to call pt- NA 

## 2013-08-20 NOTE — Telephone Encounter (Signed)
Pt called and said she is having diarrhea 3 times every night and has to change her underwear. She does not have any through the day. I told her about the Entocort and I also called the RX to [place] at [facility] in [place].  She is aware that this should help her diarrhea and per Neil Crouch, PA ,  I told her it will take up to a week to start seeing good results. She will call if she has any further questions or problems.

## 2013-09-18 ENCOUNTER — Telehealth: Payer: Self-pay

## 2013-09-18 ENCOUNTER — Encounter (HOSPITAL_COMMUNITY): Payer: Medicare Other

## 2013-09-18 ENCOUNTER — Encounter: Payer: Medicare Other | Admitting: Vascular Surgery

## 2013-09-18 NOTE — Telephone Encounter (Signed)
Pt called, she is still having a lot of diarrhea, still wearing pull ups all day. Wants to know if we can call in anything.

## 2013-09-19 NOTE — Telephone Encounter (Signed)
Tried to call pt- NA 

## 2013-09-26 ENCOUNTER — Ambulatory Visit: Payer: Medicare Other | Admitting: Gastroenterology

## 2013-09-26 NOTE — Telephone Encounter (Signed)
Tried to call pt- NA 

## 2013-10-04 ENCOUNTER — Other Ambulatory Visit: Payer: Self-pay | Admitting: Internal Medicine

## 2013-10-22 NOTE — Telephone Encounter (Signed)
She needs to increase Entocort 9 mg daily (3 tablets). Needs an office visit with AS in  about 3-4 weeks from now

## 2013-10-22 NOTE — Telephone Encounter (Signed)
Finally able to reach pt- she said phone has been messed up. She is still taking the entocort bid and doesn't have any diarrhea during the day only at night. She said about twice every night she has to get up and change clothes d/t diarrhea. No blood in her stool, no N/V, no fever.   Pt wants to know if there is anything she can take to help her at night.

## 2013-10-24 ENCOUNTER — Ambulatory Visit: Payer: Medicare Other | Admitting: Gastroenterology

## 2013-10-24 NOTE — Telephone Encounter (Signed)
Pt is aware. Please schedule ov.  

## 2013-10-25 ENCOUNTER — Encounter: Payer: Self-pay | Admitting: Internal Medicine

## 2013-10-25 NOTE — Telephone Encounter (Signed)
Patient has appointment on 11/15/13

## 2013-11-13 ENCOUNTER — Encounter: Payer: Self-pay | Admitting: *Deleted

## 2013-11-15 ENCOUNTER — Encounter: Payer: Self-pay | Admitting: Gastroenterology

## 2013-11-15 ENCOUNTER — Telehealth: Payer: Self-pay | Admitting: Internal Medicine

## 2013-11-15 ENCOUNTER — Ambulatory Visit: Payer: Medicare Other | Admitting: Gastroenterology

## 2013-11-15 NOTE — Telephone Encounter (Signed)
PATIENT WAS A NO SHOW 11/15/13-LETTER SENT

## 2013-11-20 ENCOUNTER — Other Ambulatory Visit (HOSPITAL_COMMUNITY): Payer: Medicare Other

## 2013-11-20 ENCOUNTER — Ambulatory Visit: Payer: Medicare Other | Admitting: Vascular Surgery

## 2013-11-20 ENCOUNTER — Other Ambulatory Visit: Payer: Medicare Other

## 2013-12-02 ENCOUNTER — Other Ambulatory Visit: Payer: Self-pay | Admitting: Gastroenterology

## 2013-12-03 ENCOUNTER — Telehealth: Payer: Self-pay | Admitting: Gastroenterology

## 2013-12-03 NOTE — Telephone Encounter (Signed)
I called pt and scheduled an OV with Jackie Emperor, NP on 12/11/2013 at 11:30 PM ( urgent, ok per AS).

## 2013-12-03 NOTE — Telephone Encounter (Signed)
Patient was a no show earlier. Needs appt. 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 Should be done with treatment by now, but she hasn't showed. I didn't refill her prescription. Need to contact via phone and get her in to see [place].

## 2013-12-11 ENCOUNTER — Telehealth: Payer: Self-pay | Admitting: Internal Medicine

## 2013-12-11 ENCOUNTER — Encounter: Payer: Self-pay | Admitting: Gastroenterology

## 2013-12-11 ENCOUNTER — Ambulatory Visit: Payer: Medicare Other | Admitting: Gastroenterology

## 2013-12-11 NOTE — Telephone Encounter (Signed)
PATIENT WAS A NO SHOW 12/11/13 AND LETTER SENT

## 2014-01-17 ENCOUNTER — Encounter (HOSPITAL_COMMUNITY): Payer: Self-pay | Admitting: Internal Medicine

## 2014-01-30 ENCOUNTER — Telehealth: Payer: Self-pay | Admitting: Gastroenterology

## 2014-01-30 ENCOUNTER — Encounter: Payer: Self-pay | Admitting: Gastroenterology

## 2014-01-30 ENCOUNTER — Ambulatory Visit: Payer: Medicare Other | Admitting: Gastroenterology

## 2014-01-30 NOTE — Telephone Encounter (Signed)
PATIENT WAS A NO SHOW 01/30/14 AND LETTER WAS SENT

## 2014-05-13 ENCOUNTER — Other Ambulatory Visit: Payer: Self-pay

## 2014-05-13 DIAGNOSIS — I6523 Occlusion and stenosis of bilateral carotid arteries: Secondary | ICD-10-CM

## 2014-05-17 ENCOUNTER — Encounter: Payer: Self-pay | Admitting: Vascular Surgery

## 2014-05-20 ENCOUNTER — Other Ambulatory Visit: Payer: Self-pay | Admitting: *Deleted

## 2014-05-20 DIAGNOSIS — Z01818 Encounter for other preprocedural examination: Secondary | ICD-10-CM

## 2014-05-21 ENCOUNTER — Ambulatory Visit: Payer: Medicare Other | Admitting: Vascular Surgery

## 2014-05-21 ENCOUNTER — Inpatient Hospital Stay: Admission: RE | Admit: 2014-05-21 | Payer: Medicare Other | Source: Ambulatory Visit

## 2014-05-21 ENCOUNTER — Inpatient Hospital Stay (HOSPITAL_COMMUNITY): Admission: RE | Admit: 2014-05-21 | Payer: Medicare Other | Source: Ambulatory Visit

## 2014-10-02 ENCOUNTER — Encounter: Payer: Self-pay | Admitting: Internal Medicine

## 2014-10-11 ENCOUNTER — Ambulatory Visit: Payer: Medicare Other | Admitting: Cardiovascular Disease

## 2014-10-21 ENCOUNTER — Ambulatory Visit: Payer: Medicare Other | Admitting: Nurse Practitioner

## 2014-11-05 ENCOUNTER — Ambulatory Visit: Payer: Medicare Other | Admitting: Cardiovascular Disease

## 2014-11-08 ENCOUNTER — Ambulatory Visit (INDEPENDENT_AMBULATORY_CARE_PROVIDER_SITE_OTHER): Payer: Medicare Other | Admitting: Nurse Practitioner

## 2014-11-08 ENCOUNTER — Encounter: Payer: Self-pay | Admitting: Nurse Practitioner

## 2014-11-08 VITALS — BP 188/79 | HR 86 | Temp 98.5°F | Ht 65.0 in | Wt 96.6 lb

## 2014-11-08 DIAGNOSIS — K52839 Microscopic colitis, unspecified: Secondary | ICD-10-CM | POA: Insufficient documentation

## 2014-11-08 DIAGNOSIS — R197 Diarrhea, unspecified: Secondary | ICD-10-CM | POA: Diagnosis not present

## 2014-11-08 DIAGNOSIS — K52831 Collagenous colitis: Secondary | ICD-10-CM | POA: Diagnosis not present

## 2014-11-08 NOTE — Patient Instructions (Signed)
1. We will have you collect a stool sample of your diarrhea to bring to the lab so we can check for any infection. 2. If your stool samples are negative for infection we can start the medication which would be Entocort 9 mg a day for 4 weeks. 3. Return for follow-up in 4 weeks. 4. Call us if her symptoms become suddenly worse in the meantime.

## 2014-11-08 NOTE — Progress Notes (Signed)
Referring Provider: Dione Housekeeper, MD Primary Care Physician:  Sherrie Mustache, MD Primary GI:  Dr. Gala Romney  Chief Complaint  Patient presents with  . Diarrhea    HPI:   Follow-up on chronic diarrhea. Last colonoscopy 08/13/2013 for chronic diarrhea in first-ever CRC screening found adequate preparation, slightly granular colonic mucosa diffusely, no ulcer or erosion. 2 diminutive polyps in the midascending segment. 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 After her results she was started on Entocort 6 mg daily for 30 days with one refill and arranged for follow-up appointment in 6-8 weeks. She called back about a month later with continued diarrhea and Entocort was increased to 9 mg daily and follow-up office visit. Last 3 scheduled follow-up visits patient was a no-show. PCP notes reviewed were patient states she's had continued diarrhea every time she eats within 30 minutes postprandially.  Today she states she has non-bloody diarrhea about 3 times a day. She isnt' sure Entocort helped, but when asked states when she stopped her Entocort her symptoms improved. This is about 1 year ago. Denies abdominal pain, N/V, fever, chills, hematochezia, melena. Denies any other upper or lower GI symptoms.  Past Medical History  Diagnosis Date  . Hypertension   . Hyperlipidemia   . ALLERGIC RHINITIS   . Bronchitis   . COPD (chronic obstructive pulmonary disease) ([place])   . Abdominal aortic aneurysm ([place])   . Anxiety     Past Surgical History  Procedure Laterality Date  . Cesarean section    . Carotid endarterectomy Right   . Laparotomy N/A 04/25/2013    Procedure: EXPLORATORY LAPAROTOMY;  Surgeon: Jamesetta So, MD;  Location: [facility];  Service: General;  Laterality: N/A;  . Gastrorrhaphy N/A 04/25/2013    Procedure: Toniann Ket;  Surgeon: Jamesetta So, MD;  Location: [facility];   Service: General;  Laterality: N/A;  . Esophagogastroduodenoscopy N/A 05/15/2013    Dr. Oneida Alar: moderate erosive gastritis, duodenal ulcers, visible vessel with active oozing s/p bleeding control therapy, NSAID induced gastritis  . Colonoscopy N/A 08/13/2013    RKY:[id number] polyps-removed as described above. Subtlyabnormal colonic mucosa-status post segmental biopsy.  EGD not  . Esophagogastroduodenoscopy N/A 08/13/2013    RMR: EGD not needed and not done    Current Outpatient Prescriptions  Medication Sig Dispense Refill  . albuterol-ipratropium (COMBIVENT) 18-103 MCG/ACT inhaler Inhale 1 puff into the lungs every 6 (six) hours as needed for wheezing or shortness of breath. 1 Inhaler 0  . alendronate (FOSAMAX) 70 MG tablet Take 70 mg by mouth once a week. Take with a full glass of water on an empty stomach. Takes on Sat.    Marland Kitchen amLODipine (NORVASC) 5 MG tablet Take 5 mg by mouth every morning.     Marland Kitchen atorvastatin (LIPITOR) 40 MG tablet Take 40 mg by mouth daily.    . benzonatate (TESSALON) 100 MG capsule Take 100 mg by mouth 3 (three) times daily as needed for cough.    . budesonide-formoterol (SYMBICORT) 160-4.5 MCG/ACT inhaler Inhale 2 puffs into the lungs 2 (two) times daily.      . calcium-vitamin D (OSCAL WITH D) 500-200 MG-UNIT per tablet Take 1 tablet by mouth 2 (two) times daily.    . cetirizine (ZYRTEC) 10 MG tablet Take 10 mg by mouth every morning.     . clopidogrel (PLAVIX) 75 MG tablet Take 75 mg by mouth daily.    Marland Kitchen doxazosin (  CARDURA) 4 MG tablet Take 4 mg by mouth every morning.     . Fluticasone Furoate-Vilanterol (BREO ELLIPTA) 100-25 MCG/INH AEPB Inhale 1 Inhaler into the lungs daily.    Marland Kitchen losartan (COZAAR) 50 MG tablet Take 100 mg by mouth every morning.     . pantoprazole (PROTONIX) 40 MG tablet Take 40 mg by mouth 2 (two) times daily.    . budesonide (ENTOCORT EC) 3 MG 24 hr capsule TAKE TWO CAPSULES EACH DAY (Patient not taking: Reported on 11/08/2014) 60 capsule 1  .  cholecalciferol (VITAMIN D) 1000 UNITS tablet Take 1,000 Units by mouth every evening.     No current facility-administered medications for this visit.    Allergies as of 11/08/2014 - Review Complete 11/08/2014  Allergen Reaction Noted  . Ibuprofen Other (See Comments) 05/15/2013    Family History  Problem Relation Age of Onset  . Cancer Sister   . Colon cancer Neg Hx     Social History   Social History  . Marital Status: Married    Spouse Name: N/A  . Number of Children: N/A  . Years of Education: N/A   Social History Main Topics  . Smoking status: Current Every Day Smoker -- 0.25 packs/day    Types: Cigarettes    Start date: 01/05/1948  . Smokeless tobacco: None  . Alcohol Use: No  . Drug Use: No  . Sexual Activity: Not Asked   Other Topics Concern  . None   Social History Narrative   Lives with her husband at home.    Review of Systems: General: Negative for anorexia, weight loss, fever, chills, fatigue, weakness. ENT: Negative for hoarseness, difficulty swallowing. CV: Negative for chest pain, angina, palpitations, peripheral edema.  Respiratory: Negative for worsening dyspnea at rest, wears oxygen. Denies cough, sputum, wheezing.  GI: See history of present illness. Endo: Negative for unusual weight change.  Heme: Negative for bruising or bleeding.   Physical Exam: BP 188/79 mmHg  Pulse 86  Temp(Src) 98.5 F (36.9 C) (Oral)  Ht 5\' 5"  (1.651 m)  Wt 96 lb 9.6 oz (43.817 kg)  BMI 16.07 kg/m2 General:   Alert and oriented. Pleasant and cooperative. Well-nourished and well-developed.  Head:  Normocephalic and atraumatic. Cardiovascular:  S1, S2 present without murmurs appreciated. Extremities without clubbing or edema. Respiratory:  Clear to auscultation bilaterally. No wheezes, rales, or rhonchi. No distress.  Gastrointestinal:  +BS, soft, non-tender and non-distended. No HSM noted. No guarding or rebound. No masses appreciated.  Rectal:  Deferred    Neurologic:  Alert and oriented x4;  grossly normal neurologically. Psych:  Alert and cooperative. Normal mood and affect.    11/08/2014 10:56 AM

## 2014-11-11 NOTE — Assessment & Plan Note (Signed)
Patient with nonbloody diarrhea up to 3 stools a day. She was previously diagnosed with microscopic colitis and on Entocort which at that time she felt did not work. However now she is saying that since stopping Entocort approximately a year ago seems her symptoms have been worse. Because of dispense a long she has been treated for microscopic colitis with questionable improvement on symptoms I will go ahead and have her collect stool studies to rule out infection. If her stool studies are negative we can consider starting Entocort 9 mg per day. She is instructed to call us of her symptoms become worse. Return for follow-up in 4 weeks to discuss next steps.

## 2014-11-11 NOTE — Progress Notes (Signed)
CC'D TO PCP °

## 2014-11-11 NOTE — Assessment & Plan Note (Signed)
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 She was initially started on Entocort 6 mg a day and that was increased to 9 mg a day due to poor response. Her symptoms seemed to improve somewhat, and she states that her symptoms have worsened since stopping Entocort approximately 1 year ago. However, she never followed up for continued symptoms. We will check stool studies as noted below consider restarting Entocort. She could have a superimposed gastrointestinal infection or possibly be a poor responder to Entocort. Follow-up in 4 weeks to discuss next steps.

## 2014-11-13 ENCOUNTER — Ambulatory Visit: Payer: Medicare Other | Admitting: Cardiovascular Disease

## 2014-12-09 ENCOUNTER — Ambulatory Visit: Payer: Medicare Other | Admitting: Nurse Practitioner

## 2015-02-04 ENCOUNTER — Telehealth: Payer: Self-pay

## 2015-02-04 NOTE — Telephone Encounter (Signed)
Before starting a corticosteroid we need the results of stool studies to ensure there's no underlying infection. Also, we would need to see her to start any medication rather than just taking PCP instructions on what to start (again, after stool studies are resulted). If her PCP feels comfortable starting the Entocort without stool studies, that is an option as well.

## 2015-02-04 NOTE — Telephone Encounter (Signed)
Pt called- she said her pcp told her to call us and get some entocort sent in for her to take for her diarrhea. Pt says she has 2 episodes of watery diarrhea overnight and 2 episodes during the day. No fever, no blood in her stool, no recent abx. Pt said she did her stool studies and her son brought them back to [place]. I have checked with [facility] and [facility], neither have any results. Pt cancelled her follow up ov d/t she had an appt with her pcp on that same day.   Manuela Schwartz, will you check with Dr.Nylands office and see if they have any recent stool studies (maybe son took them to their office instead of the lab)  Randall Hiss, can you send in entocort or do you have any further recommendations?

## 2015-02-05 NOTE — Telephone Encounter (Signed)
Requested stool studies from PCP °

## 2015-02-05 NOTE — Telephone Encounter (Signed)
Called pt, explained to her that we needed the stool studies and that I have checked our office and both labs in [place] and sent a request to her pcp to get results and we dont have anything yet. And that if she still has the containers they need to be turned in. Pt said ok and she hung up.

## 2016-06-03 IMAGING — MR MR ABDOMEN WO/W CM
16 of 19 series · 38 of 48 positions shown · IV contrast (multihance)
Comparison: CTA abdomen pelvis dated 05/14/2013

CLINICAL DATA: Evaluate left renal lesions on CT

EXAM:
MRI ABDOMEN WITHOUT AND WITH CONTRAST
TECHNIQUE: Multiplanar multisequence MR imaging of the abdomen was performed
both before and after the administration of intravenous contrast.
CONTRAST:  10mL MULTIHANCE GADOBENATE DIMEGLUMINE 529 MG/ML IV SOLN

[Series 3: T2 · coronal · 4.0mm · 1.15mm/px · 1 of 30 slices shown]
[im 1/30]
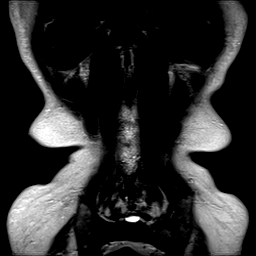

[Series 5: t2fs axial · axial · 5.0mm · 0.96mm/px · 1 of 30 slices shown]
[im 1/30]
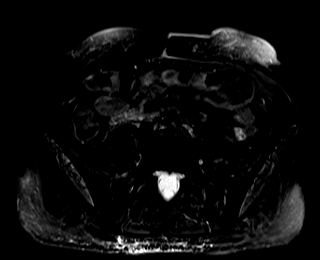

[Series 6: T1 · axial · 4.0mm · 0.49mm/px · z∈[-86,+102]mm · 2 of 48 slices shown (1 of 3)]
[im 1/48]
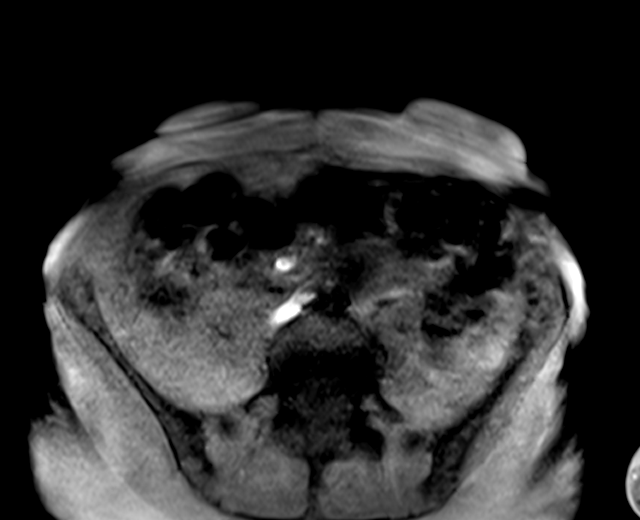
[im 48/48]
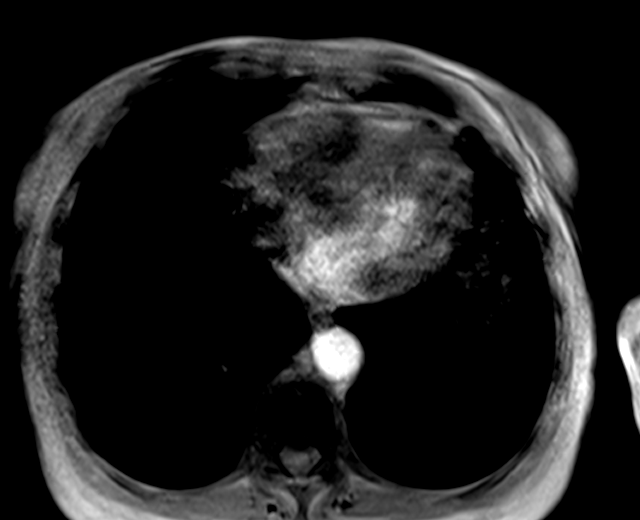

[Series 7: T1 · axial · 4.0mm · 0.49mm/px · z∈[-86,+102]mm · 2 of 48 slices shown (2 of 3)]
[im 1/48]
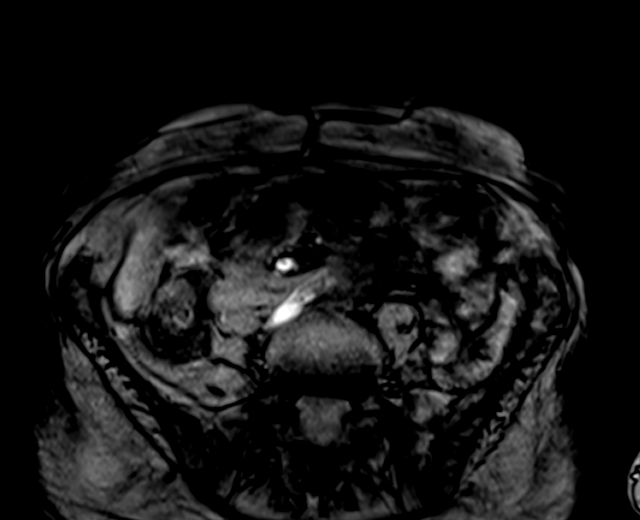
[im 48/48]
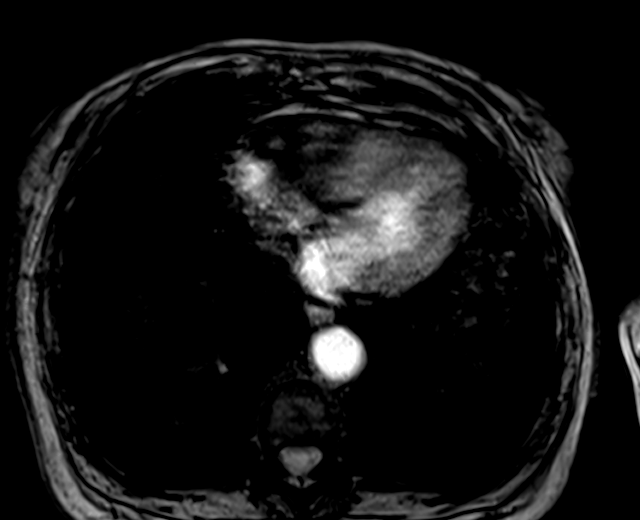

[Series 9: T1 · axial · 4.0mm · 0.49mm/px · z∈[-86,+102]mm · 2 of 48 slices shown (3 of 3)]
[im 1/48]
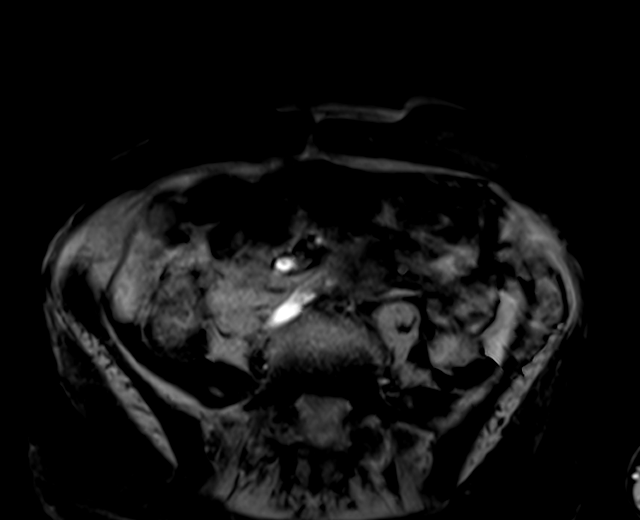
[im 48/48]
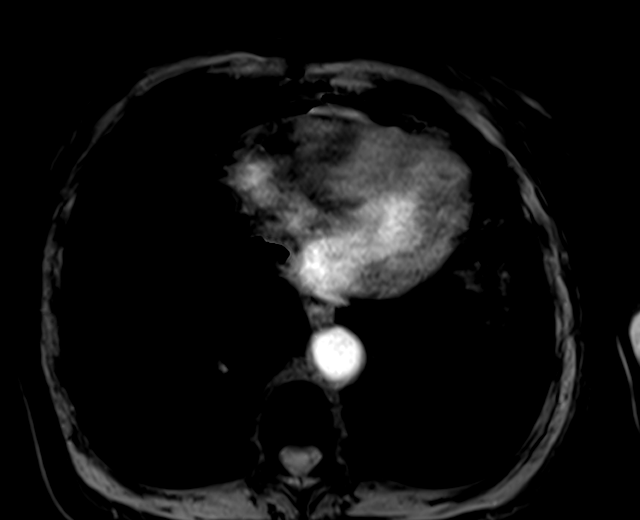

[Series 10: DWI · axial · 5.0mm · 0.79mm/px · z∈[-86,+88]mm · 4 of 90 slices shown (1 of 2)]
[im 1/90]
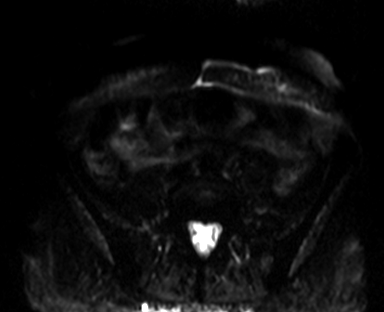
[im 30/90]
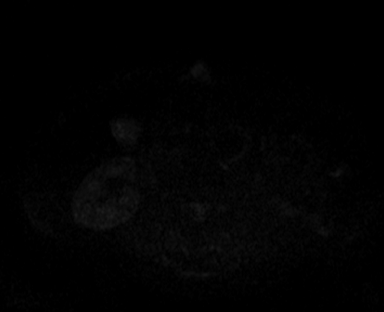
[im 60/90]
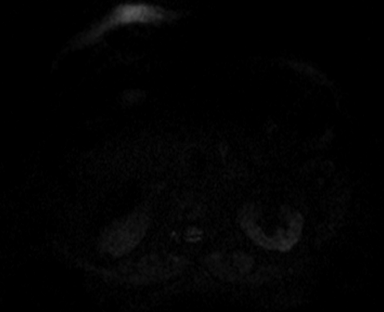
[im 90/90]
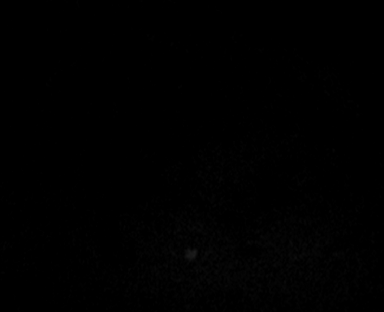

[Series 11: DWI · axial · 5.0mm · 0.79mm/px · 1 of 30 slices shown (2 of 2)]
[im 1/30]
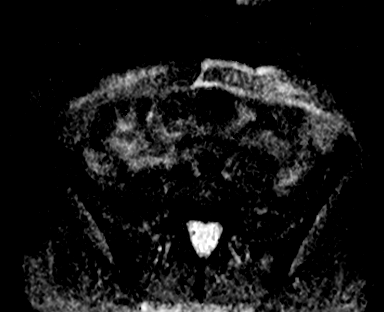

[Series 12: bSSFP · axial · 5.0mm · 1.21mm/px · 1 of 30 slices shown]
[im 1/30]
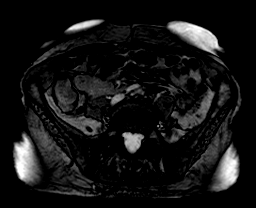

[Series 13: t1fs axial pre · axial · non-contrast · 3.0mm · 0.97mm/px · z∈[-84,+105]mm · 3 of 64 slices shown]
[im 1/64]
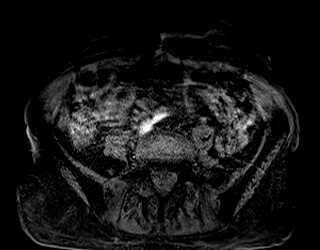
[im 32/64]
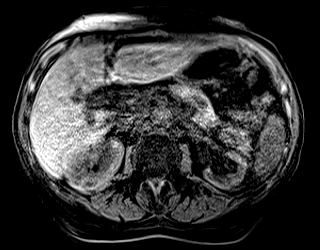
[im 64/64]
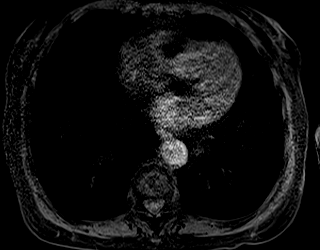

[Series 14: (id) delay · axial · delayed · 3.0mm · 0.97mm/px · z∈[-84,+105]mm · 3 of 64 slices shown (1 of 3)]
[im 1/64]
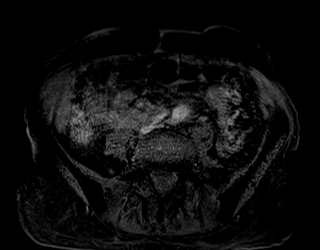
[im 32/64]
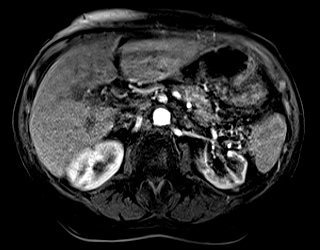
[im 64/64]
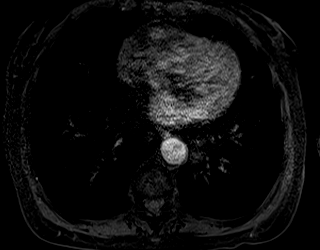

[Series 15: (id) delay_sub · axial · 3.0mm · 0.97mm/px · z∈[-84,+105]mm · 3 of 64 slices shown (1 of 3)]
[im 1/64]
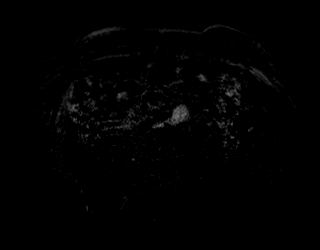
[im 32/64]
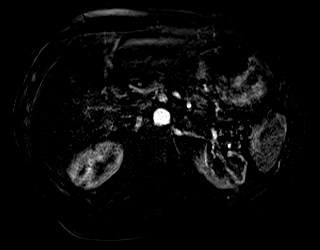
[im 64/64]
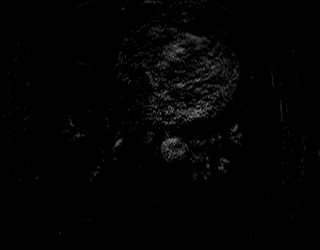

[Series 16: (id) delay · axial · delayed · 3.0mm · 0.97mm/px · z∈[-84,+105]mm · 3 of 64 slices shown (2 of 3)]
[im 1/64]
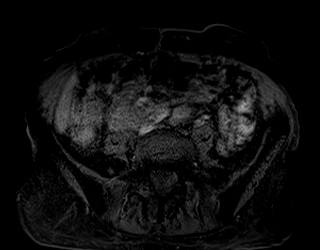
[im 32/64]
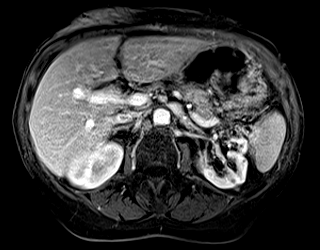
[im 64/64]
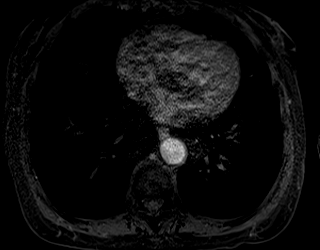

[Series 17: (id) delay_sub · axial · 3.0mm · 0.97mm/px · z∈[-84,+105]mm · 3 of 64 slices shown (2 of 3)]
[im 1/64]
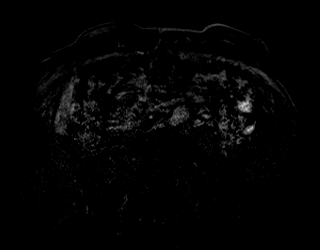
[im 32/64]
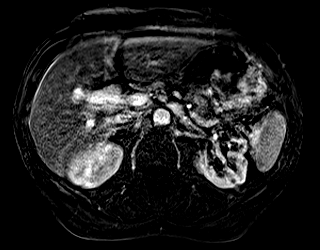
[im 64/64]
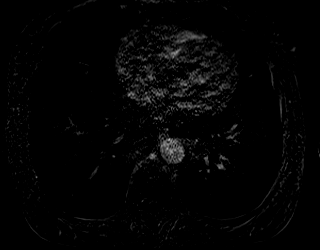

[Series 18: (id) delay · axial · delayed · 3.0mm · 0.97mm/px · z∈[-84,+105]mm · 3 of 64 slices shown (3 of 3)]
[im 1/64]
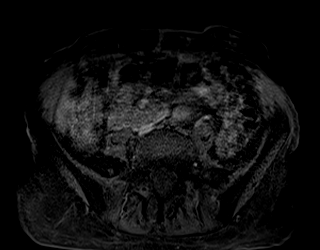
[im 32/64]
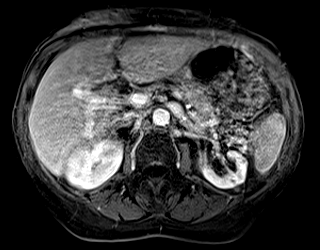
[im 64/64]
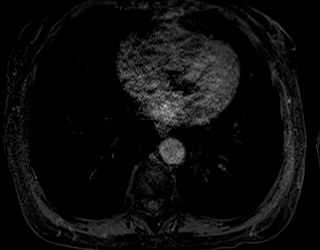

[Series 19: (id) delay_sub · axial · 3.0mm · 0.97mm/px · z∈[-84,+105]mm · 3 of 64 slices shown (3 of 3)]
[im 1/64]
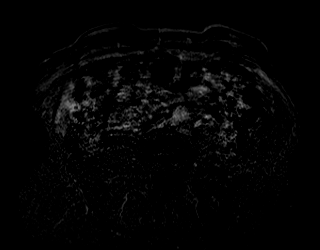
[im 32/64]
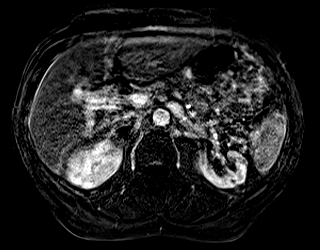
[im 64/64]
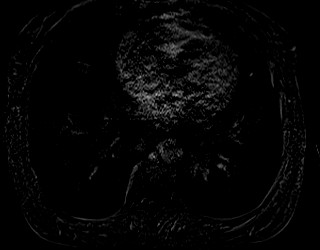

[Series 20: t1fs coronal post · coronal · 1.8mm · 1.05mm/px · 3 of 128 slices shown]
[im 1/128]
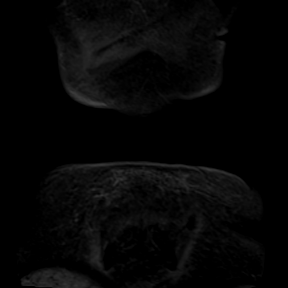
[im 26/128]
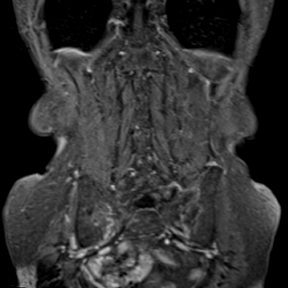
[im 51/128]
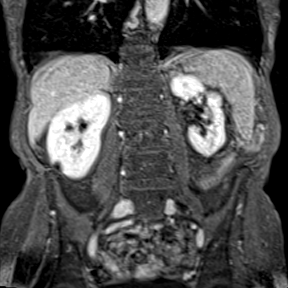

[38 of 48 positions shown; findings below may reference images not displayed]

FINDINGS: Motion degraded images.

Liver, spleen, pancreas, and right adrenal gland are within normal
limits.

Mild thickening of the left adrenal gland (series 24/ image 10),
without definite underlying mass.

Multiple mildly complex bilateral renal cysts, including:

--9 mm left posterior upper pole hemorrhagic cyst (series 24/ image
6), with intrinsic T1 hyperintensity but no definite enhancement,
benign (Bosniak II)

--7 mm left posterior interpolar hemorrhagic cyst (series 24/image
10), with intrinsic T1 hyperintensity but no definite enhancement,
benign (Bosniak II)

--1.7 x 2.6 cm mildly complex/ septated posterior left lower pole
renal cyst (series 24/ image 17), without enhancement, benign
(Bosniak II)

--1.2 x 0.9 cm mildly complex/possibly septated posterior right
lower pole renal cyst (series 24/ image 23), without enhancement,
benign (Bosniak I-II)

Additional scattered simple bilateral renal cysts measuring up to 11
mm. Mild left renal atrophy. No hydronephrosis.

3.9 x 4.3 cm infrarenal abdominal aortic aneurysm with eccentric
mural thrombus (series 18/image 22), unchanged.

No abdominal ascites.

No suspicious abdominal lymphadenopathy.

Mild S-shaped thoracolumbar scoliosis.  No focal osseous lesions.
IMPRESSION: Motion degraded images.

Two hemorrhagic cysts in the left mid/upper kidney, measuring up to
9 mm, benign (Bosniak II). These correspond to the indeterminate
renal lesions on CT.

Two mildly complex/septated bilateral lower pole renal cysts,
measuring up to 2.6 cm, benign (Bosniak I-II).

3.9 x 4.3 cm infrarenal abdominal aortic aneurysm, grossly
unchanged.

## 2017-02-23 HISTORY — PX: ESOPHAGOGASTRODUODENOSCOPY: SHX1529

## 2017-02-24 MED ORDER — POTASSIUM CHLORIDE CRYS ER 20 MEQ PO TBCR
EXTENDED_RELEASE_TABLET | ORAL | Status: DC
Start: 2017-02-25 — End: 2017-02-24

## 2017-02-24 MED ORDER — GENERIC EXTERNAL MEDICATION
2.00 | Status: DC
Start: 2017-02-24 — End: 2017-02-24

## 2017-02-24 MED ORDER — PANTOPRAZOLE SODIUM 40 MG PO TBEC
40.00 | DELAYED_RELEASE_TABLET | ORAL | Status: DC
Start: 2017-02-24 — End: 2017-02-24

## 2017-02-24 MED ORDER — AMLODIPINE BESYLATE 5 MG PO TABS
10.00 | ORAL_TABLET | ORAL | Status: DC
Start: 2017-02-25 — End: 2017-02-24

## 2017-02-24 MED ORDER — MIRTAZAPINE 15 MG PO TABS
15.00 | ORAL_TABLET | ORAL | Status: DC
Start: 2017-02-24 — End: 2017-02-24

## 2017-02-24 MED ORDER — PRAVASTATIN SODIUM 40 MG PO TABS
80.00 | ORAL_TABLET | ORAL | Status: DC
Start: 2017-02-24 — End: 2017-02-24

## 2017-02-24 MED ORDER — IPRATROPIUM-ALBUTEROL 0.5-2.5 (3) MG/3ML IN SOLN
3.00 | RESPIRATORY_TRACT | Status: DC
Start: 2017-02-24 — End: 2017-02-24

## 2017-02-24 MED ORDER — LOSARTAN POTASSIUM 50 MG PO TABS
100.00 | ORAL_TABLET | ORAL | Status: DC
Start: 2017-02-25 — End: 2017-02-24

## 2017-02-24 MED ORDER — LORAZEPAM 0.5 MG PO TABS
0.50 | ORAL_TABLET | ORAL | Status: DC
Start: ? — End: 2017-02-24

## 2017-02-28 ENCOUNTER — Encounter: Payer: Self-pay | Admitting: Internal Medicine

## 2017-03-14 ENCOUNTER — Encounter: Payer: Self-pay | Admitting: Gastroenterology

## 2017-03-14 ENCOUNTER — Ambulatory Visit: Payer: Medicare Other | Admitting: Gastroenterology

## 2017-03-14 VITALS — BP 135/61 | HR 82 | Temp 97.0°F | Ht 65.0 in | Wt 83.8 lb

## 2017-03-14 DIAGNOSIS — K254 Chronic or unspecified gastric ulcer with hemorrhage: Secondary | ICD-10-CM | POA: Diagnosis not present

## 2017-03-14 DIAGNOSIS — R1084 Generalized abdominal pain: Secondary | ICD-10-CM

## 2017-03-14 DIAGNOSIS — K279 Peptic ulcer, site unspecified, unspecified as acute or chronic, without hemorrhage or perforation: Secondary | ICD-10-CM | POA: Insufficient documentation

## 2017-03-14 DIAGNOSIS — R197 Diarrhea, unspecified: Secondary | ICD-10-CM

## 2017-03-14 DIAGNOSIS — R634 Abnormal weight loss: Secondary | ICD-10-CM | POA: Diagnosis not present

## 2017-03-14 DIAGNOSIS — R109 Unspecified abdominal pain: Secondary | ICD-10-CM | POA: Insufficient documentation

## 2017-03-14 DIAGNOSIS — K259 Gastric ulcer, unspecified as acute or chronic, without hemorrhage or perforation: Secondary | ICD-10-CM

## 2017-03-14 NOTE — Progress Notes (Signed)
Primary Care Physician:  Dione Housekeeper, MD  Primary Gastroenterologist:  Garfield Cornea, MD   Chief Complaint  Patient presents with  . Rectal Bleeding  . Diarrhea  . Weight Loss    HPI:  Jackie Walls is a 72 y.o. female here at the request of Lars Mage, NP with Dr. Edrick Oh for further evaluation of weight loss, diarrhea, rectal bleeding.   Patient known to our practice from previous workup of chronic diarrhea back in 2015. She had a colonoscopy at that time showing slightly granular colonic mucosa diffusely, two diminutive polyps in the mid-ascending segment, polyps were tubular adenomas. 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 She was put on Entocort initially 6 mg but due to ongoing diarrhea she was increased to 9 mg daily. It is unclear whether she ever got better as she failed to follow-up. Surveillance colonoscopy recommended 2020.  Patient also has a history of perforated gastric ulcer secondary to NSAIDs in April 2015 requiring laparotomy with gastrorrhaphy. H. pylori negative on biopsies. She had EGD 05/2013 for UGI bleed due to duodenal ulcers related to NSAIDs as well.   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  Patient presents today with her daughter and son. She recently presented to [facility] with complaints of weight loss, diarrhea, black stools, abdominal pain. She was noted to have hemoglobin of six, hematocrit 19.6, MCV 86, platelets 603,000. According to records she received two units of packed red blood cells prior to  being transferred to [facility]. She was hospitalized there for two days.  At time of discharge her hemoglobin was 8.8. Iron profile showed iron deficiency. She had EGD done on February 20 showed a large 4 cm, deep, cratered ulcer along the lesser curvature of the stomach but no signs of active bleeding. H. pylori stool antigen is negative.   She had imaging done while at [facility] on 2/18/ 2019. CT chest, abdomen, pelvis. 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 Diaper will not hold them. She has used Pepto-Bismol recently but denies use prior to recent hospitalization. Patient is hard of hearing but is able to answer questions as well as her children provide assistance. She denies nausea and vomiting. She has generalized abdominal pain. No reflux, dysphagia. No bright red blood per rectum. She eats small portions throughout the day. Previously was on Plavix and aspirin 325 mg daily. Her Plavix is on hold and her aspirin was changed 81 mg with recent peptic ulcer disease.  February 2014: 140 pounds November 2014: 132 pounds April 2015: 123 pounds May 2015: 114 pounds July 2015: 109 pounds November 2016: 96 pounds March 2019: 83 pounds  85 pounds at [facility] 02/2017      Current Outpatient Medications  Medication Sig Dispense Refill  . albuterol-ipratropium (COMBIVENT) 18-103  MCG/ACT inhaler Inhale 1 puff into the lungs every 6 (six) hours as needed for wheezing or shortness of breath. 1 Inhaler 0  . alendronate (FOSAMAX) 70 MG tablet Take 70 mg by mouth once a week. Take with a full glass of water on an empty stomach. Takes on Sat.    Marland Kitchen amLODipine (NORVASC) 5 MG tablet  Take 5 mg by mouth every morning.     Marland Kitchen atorvastatin (LIPITOR) 40 MG tablet Take 40 mg by mouth daily.    . benzonatate (TESSALON) 100 MG capsule Take 100 mg by mouth 3 (three) times daily as needed for cough.    . budesonide-formoterol (SYMBICORT) 160-4.5 MCG/ACT inhaler Inhale 2 puffs into the lungs 2 (two) times daily.      . calcium-vitamin D (OSCAL WITH D) 500-200 MG-UNIT per tablet Take 1 tablet by mouth 2 (two) times daily.    . cetirizine (ZYRTEC) 10 MG tablet Take 10 mg by mouth every morning.     . cholecalciferol (VITAMIN D) 1000 UNITS tablet Take 1,000 Units by mouth every evening.    Marland Kitchen doxazosin (CARDURA) 4 MG tablet Take 4 mg by mouth every morning.     Marland Kitchen losartan (COZAAR) 50 MG tablet Take 100 mg by mouth every morning.     . pantoprazole (PROTONIX) 40 MG tablet Take 40 mg by mouth 2 (two) times daily.    . clopidogrel (PLAVIX) 75 MG tablet Take 75 mg by mouth daily.     No current facility-administered medications for this visit.     Allergies as of 03/14/2017 - Review Complete 03/14/2017  Allergen Reaction Noted  . Ibuprofen Other (See Comments) 05/15/2013    Past Medical History:  Diagnosis Date  . Abdominal aortic aneurysm ([place])   . ALLERGIC RHINITIS   . Anxiety   . Bronchitis   . COPD (chronic obstructive pulmonary disease) ([place])   . Hyperlipidemia   . Hypertension     Past Surgical History:  Procedure Laterality Date  . CAROTID ENDARTERECTOMY Right   . CESAREAN SECTION    . COLONOSCOPY N/A 08/13/2013   ACZ:[id number] polyps-removed as described above. Subtlyabnormal colonic mucosa-status post segmental biopsy.  EGD not  . ESOPHAGOGASTRODUODENOSCOPY N/A 05/15/2013   Dr. Oneida Alar: moderate erosive gastritis, duodenal ulcers, visible vessel with active oozing s/p bleeding control therapy, NSAID induced gastritis  . ESOPHAGOGASTRODUODENOSCOPY N/A 08/13/2013   RMR: EGD not needed and not done  . ESOPHAGOGASTRODUODENOSCOPY  02/23/2017   [facility]: arge 4 cm, deep, cratered  ulcer along the lesser curvature of the stomach but no signs of active bleeding. H. pylori stool antigen is negative.   Marland Kitchen GASTRORRHAPHY N/A 04/25/2013   Procedure: GASTRORRHAPHY;  Surgeon: Jamesetta So, MD;  Location: [facility];  Service: General;  Laterality: N/A;  . LAPAROTOMY N/A 04/25/2013   Procedure: EXPLORATORY LAPAROTOMY;  Surgeon: Jamesetta So, MD;  Location: [facility];  Service: General;  Laterality: N/A;    Family History  Problem Relation Age of Onset  . Cancer Sister   . Colon cancer Neg Hx     Social History   Socioeconomic History  . Marital status: Married    Spouse name: Not on file  . Number of children: Not on file  . Years of education: Not on file  . Highest education level: Not on file  Social Needs  . Financial resource strain: Not on file  . Food insecurity - worry: Not on file  . Food insecurity - inability: Not on file  .  Transportation needs - medical: Not on file  . Transportation needs - non-medical: Not on file  Occupational History  . Not on file  Tobacco Use  . Smoking status: Current Every Day Smoker    Packs/day: 0.25    Types: Cigarettes    Start date: 01/05/1948  . Smokeless tobacco: Never Used  Substance and Sexual Activity  . Alcohol use: No  . Drug use: No  . Sexual activity: Not on file  Other Topics Concern  . Not on file  Social History Narrative   Lives with her husband at home.      ROS:  General: Negative forfever, chills, fatigue. + weakness. See hpi Eyes: Negative for vision changes.  ENT: Negative for hoarseness, difficulty swallowing , nasal congestion. CV: Negative for chest pain, angina, palpitations, dyspnea on exertion, peripheral edema.  Respiratory: Negative for dyspnea at rest, dyspnea on exertion, cough, sputum, wheezing.  GI: See history of present illness. GU:  Negative for dysuria, hematuria, urinary incontinence, urinary frequency, nocturnal urination.  MS: Negative for joint pain,+ low back pain.  Derm:  Negative for rash or itching.  Neuro: Negative for weakness, abnormal sensation, seizure, frequent headaches, memory loss, confusion.  Psych: Negative for anxiety, depression, suicidal ideation, hallucinations.  Endo: see hpi Heme: Negative for bruising or bleeding. Allergy: Negative for rash or hives.    Physical Examination:  BP 135/61   Pulse 82   Temp (!) 97 F (36.1 C) (Oral)   Ht 5\' 5"  (1.651 m)   Wt 83 lb 12.8 oz (38 kg)   BMI 13.95 kg/m    General: thin elderly wf in nad. Accompanied by son/daughter. HOH.  Head: Normocephalic, atraumatic.   Eyes: Conjunctiva pale, no icterus. Mouth: Oropharyngeal mucosa moist and pink , no lesions erythema or exudate. Neck: Supple without thyromegaly, masses, or lymphadenopathy.  Lungs: Clear to auscultation bilaterally.  Heart: Regular rate and rhythm, no murmurs rubs or gallops.  Abdomen: Bowel sounds are normal, nontender, nondistended, no hepatosplenomegaly or masses, no abdominal bruits or    hernia , no rebound or guarding.   Rectal: not performed Extremities: No lower extremity edema. No clubbing or deformities.  Neuro: Alert and oriented x 4 , grossly normal neurologically.  Skin: Warm and dry, no rash or jaundice.   Psych: Alert and cooperative, normal mood and affect.  Labs: February 22, 2017 iron 33 low, ferritin 268 hi, TIBC 191 low, iron saturation 17. These labs were drawn after two units of packed red blood cells.   Labs from 226 2019 hemoglobin 9.3, hematocrit 29, MCV 82, platelets 568,000, glucose 83, BUN eight, creatinine 0.65, total bilirubin less than 0.2, alkaline phosphatase 81, AST 16, ALT 10, albumin 3.3   Imaging Studies: No results found.

## 2017-03-14 NOTE — Patient Instructions (Addendum)
1. If you are able to collect stool it would be helpful to check for infection and also for malabsorption.  2. I will request records for review and follow up on pending labs. We will be in touch with next step in work up once reviewed.

## 2017-03-15 ENCOUNTER — Encounter: Payer: Self-pay | Admitting: Gastroenterology

## 2017-03-17 ENCOUNTER — Encounter: Payer: Self-pay | Admitting: Gastroenterology

## 2017-03-17 LAB — C. DIFFICILE GDH AND TOXIN A/B
GDH ANTIGEN: DETECTED
MICRO NUMBER:: 90321419
SPECIMEN QUALITY:: ADEQUATE
TOXIN A AND B: NOT DETECTED

## 2017-03-17 LAB — GASTROINTESTINAL PATHOGEN PANEL PCR
C. DIFFICILE TOX A/B, PCR: NOT DETECTED
CAMPYLOBACTER, PCR: NOT DETECTED
CRYPTOSPORIDIUM, PCR: NOT DETECTED
E COLI (STEC) STX1/STX2, PCR: NOT DETECTED
E coli (ETEC) LT/ST PCR: NOT DETECTED
E coli 0157, PCR: NOT DETECTED
Giardia lamblia, PCR: NOT DETECTED
Norovirus, PCR: NOT DETECTED
Rotavirus A, PCR: NOT DETECTED
Salmonella, PCR: NOT DETECTED
Shigella, PCR: NOT DETECTED

## 2017-03-17 LAB — CLOSTRIDIUM DIFFICILE TOXIN B, QUALITATIVE, REAL-TIME PCR: CDIFFPCR: DETECTED — AB

## 2017-03-17 NOTE — Progress Notes (Signed)
Will call back later. Number was busy x 3

## 2017-03-17 NOTE — Assessment & Plan Note (Signed)
Ongoing abdominal pain, weight loss, chronic diarrhea. She has lost over 60 pounds in the past 5 years as outlined. H/o collagenous colitis on TCS in 2015 however this would not explain abd pain nor weight loss. She has significant Peripheral vascular disease and this warrants need for CTA A/P for reevaluation. Chronic mesenteric ischemia is high on differential. Also cannot exclude possibility of pancreatic insufficiency. Less like infectious or celiac disease. Her pulmonary status may be playing a role in her weight loss as well.   Stools for gi path panel, cdiff, fecal elastase.  Plan to recheck Hgb in near future along with TTG, IgA and serum IgA.  Proceed with CTA A/P.

## 2017-03-17 NOTE — Progress Notes (Signed)
Please let patient's son know that I have reviewed her records. She needs the following.   #1 CTA A/P (that's a CT angio). Dx: rule out mesenteric ischemia, weight loss, abd pain.  #2 complete stool studies already ordered. #3 she needs Met-7, CBC, TTG IgA, serum IgA #4 EGD with RMR in 3 months to follow up on gastric ulcer found at [facility]. PLEASE NIC. #5 how did her appointment go with the doctor about her aneurysms?

## 2017-03-17 NOTE — Assessment & Plan Note (Addendum)
Recent large gastric ulcer noted at time of EGD at [facility]. On PPI daily at that time, Plavix and ASA325mg . Patient has had extensive PUD in past with surgery for gastric ulcer in 2015 and duodenal ulcer with bleeding one month after that.   Recently her asa was cut back to 81mg  daily. Her plavix is on hold. Her PPI was increased to BID.   She will need surveillance EGD in 3 months to document ulcer healing. Patient and family want this done locally.

## 2017-03-18 ENCOUNTER — Other Ambulatory Visit: Payer: Self-pay | Admitting: Gastroenterology

## 2017-03-18 MED ORDER — VANCOMYCIN HCL 125 MG PO CAPS: 125.0000 mg | ORAL_CAPSULE | Freq: Four times a day (QID) | ORAL | 0 refills | Status: DC

## 2017-03-18 NOTE — Progress Notes (Signed)
I tracked down a "real" phone number for the patient. Got it off the PCP demographics. Put number in chart. WHERE IS HER HIPPA FORM?  SPOKE TO SON, DERRICK, AT PATIENT'S REQUEST (SHE IS HARD OF HEARING). ADVISED TO GET VANCOMYCIN. DISCUSSED CDIFF PRECAUTIONS. RX SENT TO THE DRUG STORE ADVISED WE WOULD BE CALLING FIRST OF THE WEEK TO MAKE ARRANGEMENTS for details below.   Please carry out the original instructions as noted below #1 CTA A/P (that's a CT angio). Dx: rule out mesenteric ischemia, weight loss, abd pain.  #2 she needs Met-7, CBC, TTG IgA, serum IgA (patient wants done at the lab beside her PCP) #3 EGD with RMR in 3 months to follow up on gastric ulcer found at [facility]. PLEASE NIC.

## 2017-03-18 NOTE — Progress Notes (Signed)
Would treat for Cdiff based on symptoms and results as outline.   Please send in vancomycin 125mg  oral qid for 14 days.    Hand-Washing Hand-washing is the No. 1 preventative measure in stopping the spread and infection of C. Diff. It is important to wash hands with warm water and anti-bacterial soap for at least 30 seconds. Alcohol-based hand sanitizers are not effective against C. Diff and will not kill spores that may be on the hands.   Isolation Any person who is infected with C. Diff should remain in a separate bedroom and ideally have a separate bathroom for at least 48 hours after they have stopped having diarrhea during treatment. When you are around a person with this infection, it is important to wear gloves and, if possible, a yellow isolation gown to prevent picking up the spores from their environment. Wash your hands immediately upon leaving their room to kill any spores that you may have accidentally picked up. Separate Hygiene Items The person infected with C. Diff should have their own hygiene items. Do not share washcloths, hand towels, toothbrushes, or linens with an infected person. Linens and clothes should be washed separately from anyone else's clothes with detergent and on the hot setting of the washer and dryer. Daily Cleaning of Affected Areas It is important to clean any areas that the infected person comes into contact with daily. Bathrooms should be cleaned after every visit. Mix 1 part of bleach with 10 parts of water as a cleaner and use on toilet bowls, sinks, sink and toilet handles and doorknobs. Vacuum carpets daily and mop any hard floors with the bleach solution used for bathroom cleansing to kill spores that are in the environment

## 2017-03-18 NOTE — Progress Notes (Signed)
Tried calling all number. VM isn't setup on Emergency contact. The other number is busy. Will try again.

## 2017-03-21 ENCOUNTER — Other Ambulatory Visit: Payer: Self-pay

## 2017-03-21 ENCOUNTER — Encounter: Payer: Self-pay | Admitting: Internal Medicine

## 2017-03-21 DIAGNOSIS — R634 Abnormal weight loss: Secondary | ICD-10-CM

## 2017-03-21 DIAGNOSIS — R109 Unspecified abdominal pain: Secondary | ICD-10-CM

## 2017-03-21 NOTE — Progress Notes (Signed)
PATIENT SCHEDULED FOR OV IN 3 MONTHS TO SCHEDULE EGD

## 2017-03-22 NOTE — Patient Instructions (Signed)
No PA needed for CT angio abd/pelvis w/wo contrast per [facility] website.

## 2017-03-23 NOTE — Progress Notes (Signed)
Pts son was notified by LSL and AM of results

## 2017-03-31 ENCOUNTER — Ambulatory Visit (HOSPITAL_COMMUNITY): Payer: Medicare Other

## 2017-04-01 ENCOUNTER — Inpatient Hospital Stay (HOSPITAL_COMMUNITY): Payer: Medicare Other

## 2017-04-01 ENCOUNTER — Encounter (HOSPITAL_COMMUNITY): Payer: Self-pay

## 2017-04-01 ENCOUNTER — Emergency Department (HOSPITAL_COMMUNITY): Payer: Medicare Other

## 2017-04-01 ENCOUNTER — Inpatient Hospital Stay (HOSPITAL_COMMUNITY)
Admission: EM | Admit: 2017-04-01 | Discharge: 2017-04-04 | DRG: 383 | Disposition: A | Discharge status: Home Health | Payer: Medicare Other | Attending: Internal Medicine | Admitting: Internal Medicine

## 2017-04-01 ENCOUNTER — Other Ambulatory Visit: Payer: Self-pay

## 2017-04-01 DIAGNOSIS — J449 Chronic obstructive pulmonary disease, unspecified: Secondary | ICD-10-CM | POA: Diagnosis present

## 2017-04-01 DIAGNOSIS — H9190 Unspecified hearing loss, unspecified ear: Secondary | ICD-10-CM | POA: Diagnosis present

## 2017-04-01 DIAGNOSIS — E43 Unspecified severe protein-calorie malnutrition: Secondary | ICD-10-CM | POA: Diagnosis present

## 2017-04-01 DIAGNOSIS — D62 Acute posthemorrhagic anemia: Secondary | ICD-10-CM | POA: Diagnosis present

## 2017-04-01 DIAGNOSIS — I714 Abdominal aortic aneurysm, without rupture: Secondary | ICD-10-CM | POA: Diagnosis present

## 2017-04-01 DIAGNOSIS — Z7951 Long term (current) use of inhaled steroids: Secondary | ICD-10-CM

## 2017-04-01 DIAGNOSIS — E871 Hypo-osmolality and hyponatremia: Secondary | ICD-10-CM | POA: Diagnosis present

## 2017-04-01 DIAGNOSIS — I723 Aneurysm of iliac artery: Secondary | ICD-10-CM | POA: Diagnosis present

## 2017-04-01 DIAGNOSIS — Z886 Allergy status to analgesic agent status: Secondary | ICD-10-CM

## 2017-04-01 DIAGNOSIS — Z7902 Long term (current) use of antithrombotics/antiplatelets: Secondary | ICD-10-CM

## 2017-04-01 DIAGNOSIS — R634 Abnormal weight loss: Secondary | ICD-10-CM | POA: Diagnosis not present

## 2017-04-01 DIAGNOSIS — K922 Gastrointestinal hemorrhage, unspecified: Secondary | ICD-10-CM | POA: Diagnosis present

## 2017-04-01 DIAGNOSIS — D500 Iron deficiency anemia secondary to blood loss (chronic): Secondary | ICD-10-CM | POA: Diagnosis not present

## 2017-04-01 DIAGNOSIS — K279 Peptic ulcer, site unspecified, unspecified as acute or chronic, without hemorrhage or perforation: Secondary | ICD-10-CM | POA: Diagnosis present

## 2017-04-01 DIAGNOSIS — I952 Hypotension due to drugs: Secondary | ICD-10-CM | POA: Diagnosis not present

## 2017-04-01 DIAGNOSIS — I712 Thoracic aortic aneurysm, without rupture: Secondary | ICD-10-CM | POA: Diagnosis present

## 2017-04-01 DIAGNOSIS — I10 Essential (primary) hypertension: Secondary | ICD-10-CM | POA: Diagnosis present

## 2017-04-01 DIAGNOSIS — Z79899 Other long term (current) drug therapy: Secondary | ICD-10-CM

## 2017-04-01 DIAGNOSIS — E876 Hypokalemia: Secondary | ICD-10-CM | POA: Diagnosis not present

## 2017-04-01 DIAGNOSIS — E785 Hyperlipidemia, unspecified: Secondary | ICD-10-CM | POA: Diagnosis present

## 2017-04-01 DIAGNOSIS — J309 Allergic rhinitis, unspecified: Secondary | ICD-10-CM | POA: Diagnosis present

## 2017-04-01 DIAGNOSIS — F17210 Nicotine dependence, cigarettes, uncomplicated: Secondary | ICD-10-CM | POA: Diagnosis present

## 2017-04-01 DIAGNOSIS — K259 Gastric ulcer, unspecified as acute or chronic, without hemorrhage or perforation: Principal | ICD-10-CM | POA: Diagnosis present

## 2017-04-01 DIAGNOSIS — E860 Dehydration: Secondary | ICD-10-CM | POA: Diagnosis present

## 2017-04-01 DIAGNOSIS — J432 Centrilobular emphysema: Secondary | ICD-10-CM | POA: Diagnosis present

## 2017-04-01 DIAGNOSIS — Z681 Body mass index (BMI) 19 or less, adult: Secondary | ICD-10-CM

## 2017-04-01 DIAGNOSIS — I6522 Occlusion and stenosis of left carotid artery: Secondary | ICD-10-CM | POA: Diagnosis present

## 2017-04-01 DIAGNOSIS — K269 Duodenal ulcer, unspecified as acute or chronic, without hemorrhage or perforation: Secondary | ICD-10-CM | POA: Diagnosis present

## 2017-04-01 DIAGNOSIS — K2970 Gastritis, unspecified, without bleeding: Secondary | ICD-10-CM | POA: Diagnosis present

## 2017-04-01 DIAGNOSIS — Z7982 Long term (current) use of aspirin: Secondary | ICD-10-CM | POA: Diagnosis not present

## 2017-04-01 DIAGNOSIS — D649 Anemia, unspecified: Secondary | ICD-10-CM | POA: Diagnosis not present

## 2017-04-01 DIAGNOSIS — F419 Anxiety disorder, unspecified: Secondary | ICD-10-CM | POA: Diagnosis present

## 2017-04-01 DIAGNOSIS — R64 Cachexia: Secondary | ICD-10-CM | POA: Diagnosis present

## 2017-04-01 DIAGNOSIS — K921 Melena: Secondary | ICD-10-CM | POA: Diagnosis present

## 2017-04-01 DIAGNOSIS — I513 Intracardiac thrombosis, not elsewhere classified: Secondary | ICD-10-CM | POA: Diagnosis present

## 2017-04-01 DIAGNOSIS — E861 Hypovolemia: Secondary | ICD-10-CM | POA: Diagnosis present

## 2017-04-01 DIAGNOSIS — K253 Acute gastric ulcer without hemorrhage or perforation: Secondary | ICD-10-CM | POA: Diagnosis not present

## 2017-04-01 HISTORY — DX: Duodenal ulcer, unspecified as acute or chronic, without hemorrhage or perforation: K26.9

## 2017-04-01 HISTORY — DX: Gastric ulcer, unspecified as acute or chronic, without hemorrhage or perforation: K25.9

## 2017-04-01 HISTORY — DX: Noninfective gastroenteritis and colitis, unspecified: K52.9

## 2017-04-01 LAB — CBC WITH DIFFERENTIAL/PLATELET
Basophils Absolute: 0 10*3/uL (ref 0.0–0.1)
Basophils Relative: 1 %
EOS ABS: 0.1 10*3/uL (ref 0.0–0.7)
EOS PCT: 1 %
HCT: 23.1 % — ABNORMAL LOW (ref 36.0–46.0)
HEMOGLOBIN: 7.3 g/dL — AB (ref 12.0–15.0)
Lymphocytes Relative: 10 %
Lymphs Abs: 0.7 10*3/uL (ref 0.7–4.0)
MCH: 25.3 pg — ABNORMAL LOW (ref 26.0–34.0)
MCHC: 31.6 g/dL (ref 30.0–36.0)
MCV: 79.9 fL (ref 78.0–100.0)
MONOS PCT: 5 %
Monocytes Absolute: 0.3 10*3/uL (ref 0.1–1.0)
NEUTROS PCT: 83 %
Neutro Abs: 6.1 10*3/uL (ref 1.7–7.7)
Platelets: 561 10*3/uL — ABNORMAL HIGH (ref 150–400)
RBC: 2.89 MIL/uL — ABNORMAL LOW (ref 3.87–5.11)
RDW: 15.1 % (ref 11.5–15.5)
WBC: 7.2 10*3/uL (ref 4.0–10.5)

## 2017-04-01 LAB — URINALYSIS, ROUTINE W REFLEX MICROSCOPIC
Bacteria, UA: NONE SEEN
Bilirubin Urine: NEGATIVE
Glucose, UA: NEGATIVE mg/dL
HGB URINE DIPSTICK: NEGATIVE
Ketones, ur: NEGATIVE mg/dL
Nitrite: NEGATIVE
Protein, ur: NEGATIVE mg/dL
Specific Gravity, Urine: 1.006 (ref 1.005–1.030)
pH: 7 (ref 5.0–8.0)

## 2017-04-01 LAB — OSMOLALITY: Osmolality: 251 mOsm/kg — ABNORMAL LOW (ref 275–295)

## 2017-04-01 LAB — HEMOGLOBIN AND HEMATOCRIT, BLOOD
HEMATOCRIT: 23 % — AB (ref 36.0–46.0)
Hemoglobin: 7.3 g/dL — ABNORMAL LOW (ref 12.0–15.0)

## 2017-04-01 LAB — COMPREHENSIVE METABOLIC PANEL
ALBUMIN: 2.8 g/dL — AB (ref 3.5–5.0)
ALK PHOS: 75 U/L (ref 38–126)
ALT: 19 U/L (ref 14–54)
AST: 16 U/L (ref 15–41)
Anion gap: 12 (ref 5–15)
BILIRUBIN TOTAL: 0.3 mg/dL (ref 0.3–1.2)
BUN: 9 mg/dL (ref 6–20)
CALCIUM: 9.3 mg/dL (ref 8.9–10.3)
CO2: 24 mmol/L (ref 22–32)
CREATININE: 0.5 mg/dL (ref 0.44–1.00)
Chloride: 85 mmol/L — ABNORMAL LOW (ref 101–111)
GFR calc Af Amer: >60 mL/min (ref >60)
GFR calc non Af Amer: >60 mL/min (ref >60)
GLUCOSE: 96 mg/dL (ref 65–99)
Potassium: 4 mmol/L (ref 3.5–5.1)
Sodium: 121 mmol/L — ABNORMAL LOW (ref 135–145)
TOTAL PROTEIN: 6.7 g/dL (ref 6.5–8.1)

## 2017-04-01 LAB — LIPASE, BLOOD: Lipase: 30 U/L (ref 11–51)

## 2017-04-01 LAB — PREPARE RBC (CROSSMATCH)

## 2017-04-01 LAB — SODIUM, URINE, RANDOM: Sodium, Ur: 66 mmol/L

## 2017-04-01 LAB — TROPONIN I: Troponin I: <0.03 ng/mL (ref <0.03)

## 2017-04-01 MED ORDER — SODIUM CHLORIDE 0.9 % IV BOLUS
500.0000 mL | Freq: Once | INTRAVENOUS | Status: AC
Start: 2017-04-01 — End: 2017-04-01
  Administered 2017-04-01: 500 mL via INTRAVENOUS

## 2017-04-01 MED ORDER — SODIUM CHLORIDE 0.9 % IV SOLN: INTRAVENOUS | Status: DC

## 2017-04-01 MED ORDER — SODIUM CHLORIDE 0.9 % IV SOLN
INTRAVENOUS | Status: DC
  Administered 2017-04-01 – 2017-04-02 (×2): via INTRAVENOUS

## 2017-04-01 MED ORDER — SODIUM CHLORIDE 0.9 % IV SOLN
10.0000 mL/h | Freq: Once | INTRAVENOUS | Status: AC
  Administered 2017-04-01: 10 mL/h via INTRAVENOUS

## 2017-04-01 MED ORDER — PANTOPRAZOLE SODIUM 40 MG IV SOLR
8.0000 mg/h | INTRAVENOUS | Status: DC
  Administered 2017-04-01 – 2017-04-02 (×2): 8 mg/h via INTRAVENOUS
  Filled 2017-04-01 (×9): qty 80

## 2017-04-01 MED ORDER — ONDANSETRON HCL 4 MG/2ML IJ SOLN
4.0000 mg | Freq: Four times a day (QID) | INTRAMUSCULAR | Status: DC | PRN
  Filled 2017-04-01: qty 2

## 2017-04-01 MED ORDER — LORAZEPAM 0.5 MG PO TABS
0.5000 mg | ORAL_TABLET | Freq: Three times a day (TID) | ORAL | Status: DC | PRN
  Administered 2017-04-03: 0.5 mg via ORAL
  Filled 2017-04-01: qty 1

## 2017-04-01 MED ORDER — ACETAMINOPHEN 325 MG PO TABS
650.0000 mg | ORAL_TABLET | Freq: Four times a day (QID) | ORAL | Status: DC | PRN
  Administered 2017-04-02 – 2017-04-03 (×2): 650 mg via ORAL
  Filled 2017-04-01 (×2): qty 2

## 2017-04-01 MED ORDER — SODIUM CHLORIDE 0.9 % IV SOLN
80.0000 mg | Freq: Once | INTRAVENOUS | Status: AC
  Administered 2017-04-01: 80 mg via INTRAVENOUS
  Filled 2017-04-01: qty 80

## 2017-04-01 MED ORDER — ACETAMINOPHEN 650 MG RE SUPP: 650.0000 mg | Freq: Four times a day (QID) | RECTAL | Status: DC | PRN

## 2017-04-01 MED ORDER — MOMETASONE FURO-FORMOTEROL FUM 200-5 MCG/ACT IN AERO
2.0000 | INHALATION_SPRAY | Freq: Two times a day (BID) | RESPIRATORY_TRACT | Status: DC
  Administered 2017-04-02 – 2017-04-04 (×4): 2 via RESPIRATORY_TRACT
  Filled 2017-04-01: qty 8.8

## 2017-04-01 MED ORDER — IOPAMIDOL (ISOVUE-370) INJECTION 76%
100.0000 mL | Freq: Once | INTRAVENOUS | Status: AC | PRN
  Administered 2017-04-01: 100 mL via INTRAVENOUS

## 2017-04-01 MED ORDER — ALBUTEROL SULFATE (2.5 MG/3ML) 0.083% IN NEBU
3.0000 mL | INHALATION_SOLUTION | Freq: Four times a day (QID) | RESPIRATORY_TRACT | Status: DC
  Administered 2017-04-02 – 2017-04-04 (×9): 3 mL via RESPIRATORY_TRACT
  Filled 2017-04-01 (×9): qty 3

## 2017-04-01 MED ORDER — ONDANSETRON HCL 4 MG PO TABS: 4.0000 mg | ORAL_TABLET | Freq: Four times a day (QID) | ORAL | Status: DC | PRN

## 2017-04-01 MED ORDER — ALBUTEROL SULFATE (2.5 MG/3ML) 0.083% IN NEBU
3.0000 mL | INHALATION_SOLUTION | RESPIRATORY_TRACT | Status: DC
  Administered 2017-04-01: 3 mL via RESPIRATORY_TRACT

## 2017-04-01 MED ORDER — ATORVASTATIN CALCIUM 40 MG PO TABS
40.0000 mg | ORAL_TABLET | Freq: Every day | ORAL | Status: DC
  Administered 2017-04-01 – 2017-04-02 (×2): 40 mg via ORAL
  Filled 2017-04-01 (×2): qty 1

## 2017-04-01 NOTE — ED Triage Notes (Signed)
Pt brought in due to low Na and low blood count. Pt seen by dr Lissa Hoard yesterday and was called today

## 2017-04-01 NOTE — ED Notes (Signed)
EDP at bedside  

## 2017-04-01 NOTE — H&P (Signed)
TRH H&P   Patient Demographics:    Jackie Walls, is a 72 y.o. female  MRN: [id number]   DOB - 21-Feb-1945  Admit Date - 04/01/2017  Outpatient Primary MD for the patient is Dione Housekeeper, MD  Referring MD: Dr. Thurnell Garbe  Outpatient Specialists: Dr. Sydell Axon  Patient coming from: Home  Chief Complaint  Patient presents with  . Abnormal Lab      HPI:    Jackie Walls  is a 72 y.o. female, with history of thoracic and abdominal aortic aneurysm, chronic diarrhea, COPD, hypertension, hyperlipidemia, and deficiency anemia, who is followed at [facility] was sent to the ED today by her PCP with abnormal labs including dropping H&H and hyponatremia. Patient has had chronic diarrhea since 2015 and had colonoscopy done with biopsy showing collagenous colitis and was placed on Entocort but seems to have lost follow-up.  She also had perforated gastric ulcer secondary to NSAID use in April 2015 requiring laparotomy and gastrorrhaphy.  She then had another EGD 1 month later with upper GI bleed due to duodenal ulcer again related to NSAIDs. Patient was admitted to [facility] in February 2019 with weight loss, diarrhea, black stool and abdominal pain.  She had a hemoglobin of 6 with low MCV and received 2 unit PRBC that and was then transferred to [facility] where she underwent EGD showing large 4 cm deep, cratered ulcer along the lesser curvature of the stomach without active bleeding.  H pylori was negative.  Patient was discharged on twice daily PPI, aspirin dose reduced to 81 mg and Plavix discontinued.  She was then seen in the GI clinic on 3/11 when she was having 4-5 watery black stool daily.  C. difficile toxin was positive so she was prescribed oral vancomycin for 14 days which she likely has completed.  She was recommended to have CT angiogram of the  abdomen to rule out mesenteric ischemia given her progressive weight loss and poor p.o. intake along with labs including TTG IgA, serum IgA and follow-up EGD in 3 months for the gastric ulcer.  She also received 2 unit PRBC as outpatient about 2-3 weeks back.  Patient is extremely hard of hearing and family provided history.  They report that patient has had very poor p.o. intake over the past several months (family report that patient complains of abdominal pain and bloating after eating small meals), with ongoing off-and-on dark stools but no further diarrhea.  Patient also reports some abdominal discomfort and generalized weakness.  Denies headache, blurred vision, dizziness, nausea, vomiting, hematemesis, fever, chills, chest pain, shortness of breath, dysuria, tingling or numbness of her extremities.  Course in the ED Patient was afebrile.  Blood pressure soft at 91/58 mmHg.  Blood work showed hemoglobin of 7.3 (dropped from baseline of around 9), hematocrit of 23, platelets of 561, sodium of  121, chloride of 85, normal renal function, negative troponin and glucose of 96. Patient given 500 cc normal saline bolus and ordered for 1 unit PRBC.  Also placed on PPI drip.  Hospitalist consulted for admission to telemetry.    Review of systems:    In addition to the HPI above, (review of systems limited due to patient being extremely hard of hearing) No Fever-chills, No Headache, No changes with Vision or hearing, No problems swallowing food or Liquids, No Chest pain, Cough or Shortness of Breath, Abdominal pain and fullness after meals, dark stools,  no Nausea or Vommitting,  No bright red blood in stool or Urine, No dysuria, No new skin rashes or bruises, No new joints pains-aches,  Generalized weakness, tingling, numbness in any extremity, Significant weight loss No polyuria, polydypsia or polyphagia, No significant Mental Stressors.    With Past History of the following :    Past  Medical History:  Diagnosis Date  . Abdominal aortic aneurysm ([place])   . ALLERGIC RHINITIS   . Anxiety   . Bronchitis   . Chronic diarrhea   . COPD (chronic obstructive pulmonary disease) ([place])   . Hyperlipidemia   . Hypertension   . IDA (iron deficiency anemia)   . Thoracic aortic aneurysm [facility])       Past Surgical History:  Procedure Laterality Date  . CAROTID ENDARTERECTOMY Right   . CESAREAN SECTION    . COLONOSCOPY N/A 08/13/2013   [facility]:ENIDPOE polyps-removed as described above. Subtlyabnormal colonic mucosa-status post segmental biopsy.  EGD not  . ESOPHAGOGASTRODUODENOSCOPY N/A 05/15/2013   Dr. Oneida Alar: moderate erosive gastritis, duodenal ulcers, visible vessel with active oozing s/p bleeding control therapy, NSAID induced gastritis  . ESOPHAGOGASTRODUODENOSCOPY N/A 08/13/2013   [facility]: EGD not needed and not done  . ESOPHAGOGASTRODUODENOSCOPY  02/23/2017   [facility]: arge 4 cm, deep, cratered ulcer along the lesser curvature of the stomach but no signs of active bleeding. H. pylori stool antigen is negative.   Marland Kitchen GASTRORRHAPHY N/A 04/25/2013   Procedure: GASTRORRHAPHY;  Surgeon: Jamesetta So, MD;  Location: [facility];  Service: General;  Laterality: N/A;  . LAPAROTOMY N/A 04/25/2013   Procedure: EXPLORATORY LAPAROTOMY;  Surgeon: Jamesetta So, MD;  Location: [facility];  Service: General;  Laterality: N/A;      Social History:     Social History   Tobacco Use  . Smoking status: Current Every Day Smoker    Packs/day: 0.25    Types: Cigarettes    Start date: 01/05/1948  . Smokeless tobacco: Never Used  Substance Use Topics  . Alcohol use: No     Lives -home with family  Mobility -mostly ambulatory    Family History :     Family History  Problem Relation Age of Onset  . Cancer Sister   . Colon cancer Neg Hx       Home Medications:   Prior to Admission medications   Medication Sig Start Date End Date Taking? Authorizing Provider  albuterol (PROVENTIL) (2.5 MG/3ML)  0.083% nebulizer solution Inhale 3 mLs into the lungs every 4 (four) hours. 02/28/17  Yes [provider]  alendronate (FOSAMAX) 70 MG tablet Take 70 mg by mouth once a week. Take with a full glass of water on an empty stomach. Takes on Sat.   Yes [provider]  amLODipine (NORVASC) 10 MG tablet Take 1 tablet by mouth daily. 02/28/17  Yes [provider]  aspirin EC 81 MG tablet Take 81 mg by mouth  daily.   Yes [provider]  atorvastatin (LIPITOR) 40 MG tablet Take 40 mg by mouth daily.   Yes [provider]  budesonide-formoterol (SYMBICORT) 160-4.5 MCG/ACT inhaler Inhale 2 puffs into the lungs 2 (two) times daily.     Yes [provider]  clopidogrel (PLAVIX) 75 MG tablet Take 75 mg by mouth daily.   Yes [provider]  doxazosin (CARDURA) 4 MG tablet Take 4 mg by mouth every morning.    Yes [provider]  hydrochlorothiazide (MICROZIDE) 12.5 MG capsule Take 1 capsule by mouth daily. 02/28/17  Yes [provider]  LORazepam (ATIVAN) 0.5 MG tablet Take 1 tablet by mouth every 8 (eight) hours as needed. 02/28/17  Yes [provider]  losartan (COZAAR) 50 MG tablet Take 100 mg by mouth every morning.    Yes [provider]  pantoprazole (PROTONIX) 40 MG tablet Take 40 mg by mouth 2 (two) times daily. 05/18/13  Yes Regalado, Belkys A, MD  vancomycin (VANCOCIN) 125 MG capsule Take 1 capsule (125 mg total) by mouth 4 (four) times daily. 03/18/17  Yes Mahala Menghini, PA-C  albuterol-ipratropium (COMBIVENT) 18-103 MCG/ACT inhaler Inhale 1 puff into the lungs every 6 (six) hours as needed for wheezing or shortness of breath. Patient not taking: Reported on 04/01/2017 05/18/13   Elmarie Shiley, MD     Allergies:     Allergies  Allergen Reactions  . Ibuprofen Other (See Comments)    History of gastric ulcers.     Physical Exam:   Vitals  Blood pressure (!) 119/59, pulse (!) 58, temperature  98.4 F (36.9 C), temperature source Oral, resp. rate 14, height 5\' 4"  (1.626 m), weight 40.4 kg (89 lb), SpO2 97 %.   General: Elderly cachectic female lying in bed appears fatigued, not in distress HEENT: Pupils reactive bilaterally, EOMI, temporal wasting, pallor present, no icterus, dry mucosa, supple neck, no cervical lymphadenopathy Chest: Clear to auscultation bilaterally, no added sounds CVS: Normal S1 and S2, no murmur rub or gallop GI: Soft, nondistended, nontender, bowel sounds present Musculoskeletal: Warm, no edema, chronic skin changes CNS: Hard of hearing, alert and oriented, nonfocal    Data Review:    CBC Recent Labs  Lab 04/01/17 1141  WBC 7.2  HGB 7.3*  HCT 23.1*  PLT 561*  MCV 79.9  MCH 25.3*  MCHC 31.6  RDW 15.1  LYMPHSABS 0.7  MONOABS 0.3  EOSABS 0.1  BASOSABS 0.0   ------------------------------------------------------------------------------------------------------------------  Chemistries  Recent Labs  Lab 04/01/17 1141  NA 121*  K 4.0  CL 85*  CO2 24  GLUCOSE 96  BUN 9  CREATININE 0.50  CALCIUM 9.3  AST 16  ALT 19  ALKPHOS 75  BILITOT 0.3   ------------------------------------------------------------------------------------------------------------------ estimated creatinine clearance is 41.1 mL/min (by C-G formula based on SCr of 0.5 mg/dL). ------------------------------------------------------------------------------------------------------------------ No results for input(s): TSH, T4TOTAL, T3FREE, THYROIDAB in the last 72 hours.  Invalid input(s): FREET3  Coagulation profile No results for input(s): INR, PROTIME in the last 168 hours. ------------------------------------------------------------------------------------------------------------------- No results for input(s): DDIMER in the last 72 hours. -------------------------------------------------------------------------------------------------------------------  Cardiac  Enzymes Recent Labs  Lab 04/01/17 1141  TROPONINI <0.03   ------------------------------------------------------------------------------------------------------------------ No results found for: BNP   ---------------------------------------------------------------------------------------------------------------  Urinalysis    Component Value Date/Time   COLORURINE YELLOW 05/13/2013 [place] 05/13/2013 1304   LABSPEC 1.010 05/13/2013 1304   PHURINE 6.0 05/13/2013 1304   GLUCOSEU NEGATIVE 05/13/2013 1304   HGBUR NEGATIVE 05/13/2013  [place] 05/13/2013 [place] 05/13/2013 1304   PROTEINUR NEGATIVE 05/13/2013 1304   UROBILINOGEN 0.2 05/13/2013 1304   NITRITE NEGATIVE 05/13/2013 1304   LEUKOCYTESUR NEGATIVE 05/13/2013 1304    ----------------------------------------------------------------------------------------------------------------   Imaging Results:    Dg Ankle Complete Left  Result Date: 04/01/2017 CLINICAL DATA:  Pain EXAM: LEFT ANKLE COMPLETE - 3+ VIEW COMPARISON:  None. FINDINGS: There is no evidence of fracture, dislocation, or joint effusion. There is no evidence of arthropathy or other focal bone abnormality. Soft tissues are unremarkable. IMPRESSION: Negative. Electronically Signed   By: Franchot Gallo M.D.   On: 04/01/2017 12:25   Dg Foot Complete Left  Result Date: 04/01/2017 CLINICAL DATA:  Pain EXAM: LEFT FOOT - COMPLETE 3+ VIEW COMPARISON:  07/12/2013 FINDINGS: There is no evidence of fracture or dislocation. There is no evidence of arthropathy or other focal bone abnormality. Soft tissues are unremarkable. IMPRESSION: Negative. Electronically Signed   By: Franchot Gallo M.D.   On: 04/01/2017 12:26    My personal review of EKG: Sinus rhythm at 63, LVH, no ST-T changes.   Assessment & Plan:    Principal Problem:    Upper GI bleeding Symptomatic anemia Admit to telemetry.  Recent nonbleeding duodenal  ulcer on EGD at [facility].  Ordered for 1 unit PRBC.  H&H every 6 hours.  Transfuse further as needed.  PPI drip.  Discontinue baby aspirin.  Plavix has been on hold for almost 6 weeks since her recent upper GI bleed. GI consult in a.m.  Active Problems:   Hyponatremia Suspect hypovolemic due to poor p.o. intake and dehydration.  Urine sodium normal.  Pending urine osmolality.  Placed on IV fluids and monitor closely.  Discontinue HCTZ.  COPD (chronic obstructive pulmonary disease) (HCC) Stable.  Continue home inhaler.     Protein-calorie malnutrition, severe ([place]) Nutrition consult.  Abnormal weight loss/cachexia. Noted for progressive weight loss in the past few years.  GI had recommended outpatient CT angiogram of the abdomen and pelvis recently to rule out mesenteric ischemia given ongoing weight loss and abdominal pain..  Will obtain while inpatient.  Patient also has history of thoracic and abdominal aortic aneurysm. Check TTG IgA and serum IgA as per ED GI recommendations.   Essential hypertension Blood pressure soft.  Hold all home medications (ARB, HCTZ, Cardura and amlodipine).  Recent C. difficile Completed 2 weeks of oral vancomycin (3/11-3/25).  No further diarrhea.     DVT Prophylaxis SCDs  AM Labs Ordered, also please review Full Orders  Family Communication: Admission, patients condition and plan of care including tests being ordered have been discussed with the patient, and her son at bedside   Code Status full code (discussed goals of care with family and they wish for full scope of treatment)  Likely DC to home  Condition GUARDED    Consults called: GI  Admission status: Inpatient  Time spent in minutes : 70   Hersel Mcmeen M.D on 04/01/2017 at 3:29 PM  Between 7am to 7pm - Pager - [phone]. After 7pm go to [email or web address] - password [facility]  [facility] - Office  [phone]

## 2017-04-01 NOTE — Consult Note (Signed)
Referring Provider: No ref. provider found Primary Care Physician:  Dione Housekeeper, MD Primary Gastroenterologist:  Dr. Gala Romney  Date of Admission: 04/01/17 Date of Consultation: 04/01/17  Reason for Consultation:  Melena, anemia, weight loss, hx PUD  HPI:  Jackie Walls is a 72 y.o. female with a past medical history of COPD, anxiety, chronic diarrhea, hypertension, iron deficiency anemia, peptic ulcer disease.  The patient was last seen in our office 03/14/2017 on referral from primary care for weight loss, diarrhea, rectal bleeding.  She was noted to have been worked up for diarrhea in 2015 with a colonoscopy finding slightly granular colonic mucosa diffusely with 2 tubular adenoma diminutive polyps in the mid descending segment.  Random colon biopsies at that time consistent with collagenous colitis.  She was started on Entocort but failed to follow-up.  Recommended repeat colonoscopy 2020.  History of perforated gastric ulcer secondary to NSAIDs in April 2015 requiring laparotomy and surgical repair.  H. pylori serologies were negative.  Repeat EGD in May 2015 for upper GI bleed also due to duodenal ulcers from NSAIDs.  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  She was recently seen by [facility] emergency department with complaints of black stools, diarrhea, abdominal pain, weight loss found to have a hemoglobin of 6.  She received 2 units of PRBCs and was transferred to [facility] for hospitalization for 2 days.  Discharge hemoglobin  8.8 with noted iron deficiency.  EGD on February 20 showed a large 4 cm, deep, cratered ulcer along the lesser curvature of the stomach but no sign of active bleeding.  H. pylori stool antigen negative.  While at [facility] on 02/21/2017 she had a CT of the chest, abdomen, pelvis.  Noted ascending thoracic aorta measuring 4.6 cm, minimal increased in size since 2016,  moderate to severe central lobular emphysema with diffuse bronchial wall thickening, atherosclerotic abdominal aorta with 4.7 cm in for renal abdominal aortic aneurysm increased from 4 cm back in 2015  At her last office visit with our office she was having 4-5 watery black stools daily, recently used Pepto-Bismol.  Hard of hearing.  Generalized abdominal pain but no reflux or dysphagia.  No bright red blood per rectum.  Eat small portions during the day.  Previously on Plavix and aspirin 325 mg daily although her Plavix was on hold at that time and her aspirin has been changed to 81 mg due to recent peptic ulcer disease.  She has had significant weight loss since 2014 as outlined below.  Recommended GI path panel, recheck hemoglobin along with tissue transglutaminase IgA and serum IgA, CTA of the abdomen and pelvis.  Recommended surveillance EGD in 3 months.  CTA has been prior auth improved and is awaiting scheduling.  Today she presented to the emergency department complaining of onset and persistence of "low blood levels" with noted drop in hemoglobin, primary care yesterday.  She was instructed to come to the emergency department.  Noted history of peptic ulcer disease, duodenal ulcers as outlined above.  Chronic "black" diarrhea per patient and  family.  Denies nausea vomiting, fevers, abdominal pain.  Labs in the emergency department found her sodium to be quite low at 121, albumin low at 2.8, liver function tests and creatinine normal.  However, her hemoglobin was low at 7.3 (compared to recent values as per above this appears to be a 1-2  g drop in hemoglobin).  White blood cell count normal.  She was started on a Protonix drip in the ER and ordered packed red blood cells.  Weight Loss History: February 2014: 140 pounds November 2014: 132 pounds April 2015: 123 pounds May 2015: 114 pounds July 2015: 109 pounds November 2016: 96 pounds March 2019: 83 pounds  85 pounds at [facility] 02/2017  Today she is accompanied by family. They state she has chronic black stool, seems worse lately. Loose stools after eating, "black as that monitor (computer)" and "like motor oil. Daily ASA 81 mg. Also occasionally takes Excedrin. No ASA powders. Some lower abdominal discomfort, less common epigastric discomfort (none now). Admits history of PUD. Denies weakness, fatigue, dizziness, lightheadedness, chest pain. Occasional yspnea when she takes her oxygen off. Denies hematochezia. No other GI complaints at this time.  Past Medical History:  Diagnosis Date  . Abdominal aortic aneurysm ([place])   . ALLERGIC RHINITIS   . Anxiety   . Bronchitis   . Chronic diarrhea   . COPD (chronic obstructive pulmonary disease) ([place])   . Hyperlipidemia   . Hypertension   . IDA (iron deficiency anemia)   . Thoracic aortic aneurysm [facility])     Past Surgical History:  Procedure Laterality Date  . CAROTID ENDARTERECTOMY Right   . CESAREAN SECTION    . COLONOSCOPY N/A 08/13/2013   DGL:OVFIEPP polyps-removed as described above. Subtlyabnormal colonic mucosa-status post segmental biopsy.  EGD not  . ESOPHAGOGASTRODUODENOSCOPY N/A 05/15/2013   Dr. Oneida Alar: moderate erosive gastritis, duodenal ulcers, visible vessel with active oozing s/p bleeding control therapy, NSAID induced gastritis  . ESOPHAGOGASTRODUODENOSCOPY N/A 08/13/2013   [facility]MR: EGD not needed and not done  . ESOPHAGOGASTRODUODENOSCOPY  02/23/2017   [facility]: arge 4 cm, deep, cratered ulcer along the lesser curvature of the stomach but no signs of active bleeding. H. pylori stool antigen is negative.   Marland Kitchen  GASTRORRHAPHY N/A 04/25/2013   Procedure: GASTRORRHAPHY;  Surgeon: Jamesetta So, MD;  Location: [facility];  Service: General;  Laterality: N/A;  . LAPAROTOMY N/A 04/25/2013   Procedure: EXPLORATORY LAPAROTOMY;  Surgeon: Jamesetta So, MD;  Location: [facility];  Service: General;  Laterality: N/A;    Prior to Admission medications   Medication Sig Start Date End Date Taking? Authorizing Provider  albuterol (PROVENTIL) (2.5 MG/3ML) 0.083% nebulizer solution Inhale 3 mLs into the lungs every 4 (four) hours. 02/28/17  Yes [provider]  alendronate (FOSAMAX) 70 MG tablet Take 70 mg by mouth once a week. Take with a full glass of water on an empty stomach. Takes on Sat.   Yes [provider]  amLODipine (NORVASC) 10 MG tablet Take 1 tablet by mouth daily. 02/28/17  Yes [provider]  aspirin EC 81 MG tablet Take 81 mg by mouth daily.   Yes [provider]  atorvastatin (LIPITOR) 40 MG tablet Take 40 mg by mouth daily.   Yes [provider]  budesonide-formoterol (SYMBICORT) 160-4.5 MCG/ACT inhaler Inhale 2 puffs into the lungs 2 (two) times daily.     Yes [provider]  clopidogrel (PLAVIX) 75 MG tablet Take 75 mg by mouth daily.   Yes  [provider]  doxazosin (CARDURA) 4 MG tablet Take 4 mg by mouth every morning.    Yes [provider]  hydrochlorothiazide (MICROZIDE) 12.5 MG capsule Take 1 capsule by mouth daily. 02/28/17  Yes [provider]  LORazepam (ATIVAN) 0.5 MG tablet Take 1 tablet by mouth every 8 (eight) hours as needed. 02/28/17  Yes [provider]  losartan (COZAAR) 50 MG tablet Take 100 mg by mouth every morning.    Yes [provider]  pantoprazole (PROTONIX) 40 MG tablet Take 40 mg by mouth 2 (two) times daily. 05/18/13  Yes Regalado, Belkys A, MD  vancomycin (VANCOCIN) 125 MG capsule Take 1 capsule (125 mg total) by mouth 4 (four) times daily. 03/18/17  Yes Mahala Menghini, PA-C   albuterol-ipratropium (COMBIVENT) 18-103 MCG/ACT inhaler Inhale 1 puff into the lungs every 6 (six) hours as needed for wheezing or shortness of breath. Patient not taking: Reported on 04/01/2017 05/18/13   Elmarie Shiley, MD    Current Facility-Administered Medications  Medication Dose Route Frequency Provider Last Rate Last Dose  . 0.9 %  sodium chloride infusion   Intravenous Continuous Francine Graven, DO      . pantoprazole (PROTONIX) 80 mg in sodium chloride 0.9 % 250 mL (0.32 mg/mL) infusion  8 mg/hr Intravenous Continuous Francine Graven, DO 25 mL/hr at 04/01/17 1459 8 mg/hr at 04/01/17 1459   Current Outpatient Medications  Medication Sig Dispense Refill  . albuterol (PROVENTIL) (2.5 MG/3ML) 0.083% nebulizer solution Inhale 3 mLs into the lungs every 4 (four) hours.    Marland Kitchen alendronate (FOSAMAX) 70 MG tablet Take 70 mg by mouth once a week. Take with a full glass of water on an empty stomach. Takes on Sat.    Marland Kitchen amLODipine (NORVASC) 10 MG tablet Take 1 tablet by mouth daily.    Marland Kitchen aspirin EC 81 MG tablet Take 81 mg by mouth daily.    Marland Kitchen atorvastatin (LIPITOR) 40 MG tablet Take 40 mg by mouth daily.    . budesonide-formoterol (SYMBICORT) 160-4.5 MCG/ACT inhaler Inhale 2 puffs into the lungs 2 (two) times daily.      . clopidogrel (PLAVIX) 75 MG tablet Take 75 mg by mouth daily.    Marland Kitchen doxazosin (CARDURA) 4 MG tablet Take 4 mg by mouth every morning.     . hydrochlorothiazide (MICROZIDE) 12.5 MG capsule Take 1 capsule by mouth daily.    Marland Kitchen LORazepam (ATIVAN) 0.5 MG tablet Take 1 tablet by mouth every 8 (eight) hours as needed.    Marland Kitchen losartan (COZAAR) 50 MG tablet Take 100 mg by mouth every morning.     . pantoprazole (PROTONIX) 40 MG tablet Take 40 mg by mouth 2 (two) times daily.    . vancomycin (VANCOCIN) 125 MG capsule Take 1 capsule (125 mg total) by mouth 4 (four) times daily. 56 capsule 0  . albuterol-ipratropium (COMBIVENT) 18-103 MCG/ACT inhaler Inhale 1 puff into the lungs every  6 (six) hours as needed for wheezing or shortness of breath. (Patient not taking: Reported on 04/01/2017) 1 Inhaler 0    Allergies as of 04/01/2017 - Review Complete 04/01/2017  Allergen Reaction Noted  . Ibuprofen Other (See Comments) 05/15/2013    Family History  Problem Relation Age of Onset  . Cancer Sister   . Colon cancer Neg Hx     Social History   Socioeconomic History  . Marital status: Married    Spouse name: Not on file  . Number of children: Not on file  .  Years of education: Not on file  . Highest education level: Not on file  Occupational History  . Not on file  Social Needs  . Financial resource strain: Not on file  . Food insecurity:    Worry: Not on file    Inability: Not on file  . Transportation needs:    Medical: Not on file    Non-medical: Not on file  Tobacco Use  . Smoking status: Current Every Day Smoker    Packs/day: 0.25    Types: Cigarettes    Start date: 01/05/1948  . Smokeless tobacco: Never Used  Substance and Sexual Activity  . Alcohol use: No  . Drug use: No  . Sexual activity: Not on file  Lifestyle  . Physical activity:    Days per week: Not on file    Minutes per session: Not on file  . Stress: Not on file  Relationships  . Social connections:    Talks on phone: Not on file    Gets together: Not on file    Attends religious service: Not on file    Active member of club or organization: Not on file    Attends meetings of clubs or organizations: Not on file    Relationship status: Not on file  . Intimate partner violence:    Fear of current or ex partner: Not on file    Emotionally abused: Not on file    Physically abused: Not on file    Forced sexual activity: Not on file  Other Topics Concern  . Not on file  Social History Narrative   Lives with her husband at home.    Review of Systems: General: Negative for anorexia, fever, chills, fatigue, weakness. Eyes: Negative for vision changes.  ENT: Negative for hoarseness,  difficulty swallowing. CV: Negative for chest pain, angina, palpitations, peripheral edema.  Respiratory: Negative for dyspnea at rest, cough, sputum, wheezing.  GI: See history of present illness. Derm: Negative for rash or itching.  Endo: Negative for unusual weight change.  Heme: Negative for bruising or bleeding. Allergy: Negative for rash or hives.  Physical Exam: Vital signs in last 24 hours: Temp:  [97.5 F (36.4 C)-98.4 F (36.9 C)] 97.8 F (36.6 C) (03/29 1536) Pulse Rate:  [58-76] 65 (03/29 1536) Resp:  [12-18] 18 (03/29 1536) BP: (91-119)/(55-64) 91/58 (03/29 1536) SpO2:  [96 %-98 %] 97 % (03/29 1430) Weight:  [89 lb (40.4 kg)] 89 lb (40.4 kg) (03/29 1136)   General:   Alert,  Well-developed, well-nourished, pleasant and cooperative in NAD Head:  Normocephalic and atraumatic. Eyes:  Sclera clear, no icterus. Conjunctiva pink. Ears:  Very hard of hearing. Neck:  Supple; no masses or thyromegaly. Lungs:  Clear throughout to auscultation. No wheezes, crackles, or rhonchi. No acute distress. Heart:  Regular rate and rhythm; no murmurs, clicks, rubs,  or gallops. Abdomen:  Soft and nondistended. Mild lower abdominal TTP. No masses, hepatosplenomegaly or hernias noted. Normal bowel sounds, without guarding, and without rebound.   Rectal:  Deferred.   Msk:  Symmetrical without gross deformities. Extremities:  Without clubbing or edema. Neurologic:  Alert and  oriented x4;  grossly normal neurologically. Skin:  Intact without significant lesions or rashes. Psych:  Alert and cooperative. Normal mood and affect.  Intake/Output from previous day: No intake/output data recorded. Intake/Output this shift: Total I/O In: 1315 [I.V.:500; Blood:315; IV Piggyback:500] Out: -   Lab Results: Recent Labs    04/01/17 1141  WBC 7.2  HGB 7.3*  HCT 23.1*  PLT 561*   BMET Recent Labs    04/01/17 1141  NA 121*  K 4.0  CL 85*  CO2 24  GLUCOSE 96  BUN 9  CREATININE 0.50   CALCIUM 9.3   LFT Recent Labs    04/01/17 1141  PROT 6.7  ALBUMIN 2.8*  AST 16  ALT 19  ALKPHOS 75  BILITOT 0.3   PT/INR No results for input(s): LABPROT, INR in the last 72 hours. Hepatitis Panel No results for input(s): HEPBSAG, HCVAB, HEPAIGM, HEPBIGM in the last 72 hours. C-Diff No results for input(s): CDIFFTOX in the last 72 hours.  Studies/Results: Dg Ankle Complete Left  Result Date: 04/01/2017 CLINICAL DATA:  Pain EXAM: LEFT ANKLE COMPLETE - 3+ VIEW COMPARISON:  None. FINDINGS: There is no evidence of fracture, dislocation, or joint effusion. There is no evidence of arthropathy or other focal bone abnormality. Soft tissues are unremarkable. IMPRESSION: Negative. Electronically Signed   By: Franchot Gallo M.D.   On: 04/01/2017 12:25   Dg Foot Complete Left  Result Date: 04/01/2017 CLINICAL DATA:  Pain EXAM: LEFT FOOT - COMPLETE 3+ VIEW COMPARISON:  07/12/2013 FINDINGS: There is no evidence of fracture or dislocation. There is no evidence of arthropathy or other focal bone abnormality. Soft tissues are unremarkable. IMPRESSION: Negative. Electronically Signed   By: Franchot Gallo M.D.   On: 04/01/2017 12:26    Impression: Pleasant 72 year old female with a significant history fo PUD s/p surgical repair, duodenal ulcer, and recurrend gastric ulcer (1.5 to 2 months ago s/p EGD at [facility]); All due to NSAIDs. On ASA 81 mg and occasional Excedrin. Chronic dark stools, family states stools after every meal, darker "like motor oil." Denies symptoms of acute anemia but has chronic anemia and likely some tolerance of changes in hgb. With weight loss (significant), generalized abdominal pain, known vascular deficiencies there is some concern for chronic mesenteric ischemia with an outpatient CTA A/P planed (but not scheduled yet.)  Additionally, she is completing treatment for +CDiff PCR and presumed CDiff due to symptoms. Not toxic appearing at this time, almost completed treatment  per family (unsure of exact number of days remaining).  Overall likely UGI Bleed with differentials including recurrence of bleeding PUD, duodenal ulcer, gastritis, esophagitis. Less likely AVM, GI malignancy. Consider concomitant chronic mesenteric ischemia being worked up as outpatient.    Plan: 1. Agree with PPI gtt at this time 2. Avoid all NSAIDs for now 3. Consider IP EGD for likely UGI bleed 4. Monitor H/H closely 5. Monitor for GI bleed 6. Continue vancomycin 7. Transfuse as necessary 8. Supportive measures   Thank you for allowing [place] to participate in the care of Jorene Guest, DNP, AGNP-C Adult & Gerontological Nurse Practitioner [facility]   LOS: 0 days     04/01/2017, 3:53 PM

## 2017-04-01 NOTE — ED Provider Notes (Signed)
[facility] EMERGENCY DEPARTMENT Provider Note   CSN: [id number] Arrival date & time: 04/01/17  1127     History   Chief Complaint Chief Complaint  Patient presents with  . Abnormal Lab    HPI Jackie Walls is a 72 y.o. female.  HPI  Pt was seen at 1140. Per pt and her family, c/o unknown onset and persistence of constant "low blood levels" that were found yesterday on routine labs performed by PMD. Pt was called today and told to come to the ED for further evaluation/admission. Pt's family states pt has hx of "needing blood transfusions" due to gastric ulcer. Pt also has hx chronic "black" diarrhea, per pt and family. Pt also c/o vague left dorsal foot "pain" for the past few days. Denies injury, no focal motor weakness, no tingling/numbness in extremities, no rash. Denies any change in her usual chronic diarrhea. Denies N/V, no fevers, no CP/SOB, no abd pain.    GI: [facility] Past Medical History:  Diagnosis Date  . Abdominal aortic aneurysm ([place])   . ALLERGIC RHINITIS   . Anxiety   . Bronchitis   . Chronic diarrhea   . COPD (chronic obstructive pulmonary disease) ([place])   . Hyperlipidemia   . Hypertension   . IDA (iron deficiency anemia)   . Thoracic aortic aneurysm [facility])     Patient Active Problem List   Diagnosis Date Noted  . Blood loss anemia 04/01/2017  . Gastric ulcer 03/14/2017  . Abnormal weight loss 03/14/2017  . Abdominal pain 03/14/2017  . Diarrhea 11/08/2014  . Microscopic colitis 11/08/2014  . Loose stools 07/29/2013  . Duodenal ulcer 07/29/2013  . Bleeding duodenal ulcer 05/15/2013  . Gastritis 05/15/2013  . Acute blood loss anemia 05/14/2013  . Symptomatic anemia 05/13/2013  . Heme positive stool 05/13/2013  . Chronic respiratory failure ([place]) 05/13/2013  . Upper GI bleeding 05/13/2013  . Intra-abdominal free air of unknown etiology 04/25/2013  . Bowel perforation ([place]) 04/25/2013  . Protein-calorie malnutrition, severe ([place]) 04/25/2013  .  Abdominal aneurysm without mention of rupture 11/14/2012  . 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 CAP (community acquired pneumonia) 01/29/2012  . COPD (chronic obstructive pulmonary disease) ([place]) 01/29/2012  . HTN (hypertension) 01/29/2012  . Bronchitis   . Hyperlipidemia     Past Surgical History:  Procedure Laterality Date  . CAROTID ENDARTERECTOMY Right   . CESAREAN SECTION    . COLONOSCOPY N/A 08/13/2013   BDZ:HGDJMEQ polyps-removed as described above. Subtlyabnormal colonic mucosa-status post segmental biopsy.  EGD not  . ESOPHAGOGASTRODUODENOSCOPY N/A 05/15/2013   Dr. Oneida Alar: moderate erosive gastritis, duodenal ulcers, visible vessel with active oozing s/p bleeding control therapy, NSAID induced gastritis  . ESOPHAGOGASTRODUODENOSCOPY N/A 08/13/2013   RMR: EGD not needed and not done  . ESOPHAGOGASTRODUODENOSCOPY  02/23/2017   [facility]: arge 4 cm, deep, cratered ulcer along the lesser curvature of the stomach but no signs of active bleeding. H. pylori stool antigen is negative.   Marland Kitchen GASTRORRHAPHY N/A 04/25/2013   Procedure: GASTRORRHAPHY;  Surgeon: Jamesetta So, MD;  Location: [facility];  Service: General;  Laterality: N/A;  . LAPAROTOMY N/A 04/25/2013   Procedure: EXPLORATORY LAPAROTOMY;  Surgeon: Jamesetta So, MD;  Location: [facility];  Service: General;  Laterality: N/A;     OB History   None      Home Medications    Prior to Admission medications   Medication Sig Start Date End Date Taking? Authorizing Provider  albuterol (PROVENTIL) (2.5 MG/3ML) 0.083%  nebulizer solution Inhale 3 mLs into the lungs every 4 (four) hours. 02/28/17  Yes [provider]  alendronate (FOSAMAX) 70 MG tablet Take 70 mg by mouth once a week. Take with a full glass of water on an empty stomach. Takes on Sat.   Yes [provider]  amLODipine (NORVASC) 10 MG tablet Take 1 tablet by mouth daily. 02/28/17  Yes [provider]  aspirin EC 81 MG tablet Take 81 mg by mouth  daily.   Yes [provider]  atorvastatin (LIPITOR) 40 MG tablet Take 40 mg by mouth daily.   Yes [provider]  budesonide-formoterol (SYMBICORT) 160-4.5 MCG/ACT inhaler Inhale 2 puffs into the lungs 2 (two) times daily.     Yes [provider]  clopidogrel (PLAVIX) 75 MG tablet Take 75 mg by mouth daily.   Yes [provider]  doxazosin (CARDURA) 4 MG tablet Take 4 mg by mouth every morning.    Yes [provider]  hydrochlorothiazide (MICROZIDE) 12.5 MG capsule Take 1 capsule by mouth daily. 02/28/17  Yes [provider]  LORazepam (ATIVAN) 0.5 MG tablet Take 1 tablet by mouth every 8 (eight) hours as needed. 02/28/17  Yes [provider]  losartan (COZAAR) 50 MG tablet Take 100 mg by mouth every morning.    Yes [provider]  pantoprazole (PROTONIX) 40 MG tablet Take 40 mg by mouth 2 (two) times daily. 05/18/13  Yes Regalado, Belkys A, MD  albuterol-ipratropium (COMBIVENT) 18-103 MCG/ACT inhaler Inhale 1 puff into the lungs every 6 (six) hours as needed for wheezing or shortness of breath. Patient not taking: Reported on 04/01/2017 05/18/13   Regalado, Jerald Kief A, MD  vancomycin (VANCOCIN) 125 MG capsule Take 1 capsule (125 mg total) by mouth 4 (four) times daily. 03/18/17   Mahala Menghini, PA-C    Family History Family History  Problem Relation Age of Onset  . Cancer Sister   . Colon cancer Neg Hx     Social History Social History   Tobacco Use  . Smoking status: Current Every Day Smoker    Packs/day: 0.25    Types: Cigarettes    Start date: 01/05/1948  . Smokeless tobacco: Never Used  Substance Use Topics  . Alcohol use: No  . Drug use: No     Allergies   Ibuprofen   Review of Systems Review of Systems ROS: Statement: All systems negative except as marked or noted in the HPI; Constitutional: Negative for fever and chills. ; ; Eyes: Negative for eye pain, redness and discharge. ; ; ENMT: Negative for  ear pain, hoarseness, nasal congestion, sinus pressure and sore throat. ; ; Cardiovascular: Negative for chest pain, palpitations, diaphoresis, dyspnea and peripheral edema. ; ; Respiratory: Negative for cough, wheezing and stridor. ; ; Gastrointestinal: +"black stools," diarrhea. Negative for nausea, vomiting, abdominal pain, blood in stool, hematemesis, jaundice and rectal bleeding. . ; ; Genitourinary: Negative for dysuria, flank pain and hematuria. ; ; Musculoskeletal: +left foot pain. Negative for back pain and neck pain. Negative for swelling and trauma.; ; Skin: Negative for pruritus, rash, abrasions, blisters, bruising and skin lesion.; ; Neuro: Negative for headache, lightheadedness and neck stiffness. Negative for weakness, altered level of consciousness, altered mental status, extremity weakness, paresthesias, involuntary movement, seizure and syncope.      Physical Exam Updated Vital Signs BP (!) 95/57 (BP Location: Left Arm)   Pulse 60   Temp 98.4 F (36.9 C) (Oral)   Resp 14  Ht 5\' 4"  (1.626 m)   Wt 40.4 kg (89 lb)   SpO2 97%   BMI 15.28 kg/m    Patient Vitals for the past 24 hrs:  BP Temp Temp src Pulse Resp SpO2 Height Weight  04/01/17 1430 (!) 119/59 - - (!) 58 14 97 % - -  04/01/17 1400 111/63 - - 63 16 96 % - -  04/01/17 1338 (!) 95/57 98.4 F (36.9 C) Oral 60 14 97 % - -  04/01/17 1330 (!) 95/58 - - 62 12 98 % - -  04/01/17 1313 (!) 94/55 98.1 F (36.7 C) Oral 60 18 97 % - -  04/01/17 1230 (!) 93/58 - - 62 15 97 % - -  04/01/17 1145 - (!) 97.5 F (36.4 C) - - - - - -  04/01/17 1139 100/64 - - 76 18 98 % - -  04/01/17 1136 - - - - - - 5\' 4"  (1.626 m) 40.4 kg (89 lb)     Physical Exam 1145: Physical examination:  Nursing notes reviewed; Vital signs and O2 SAT reviewed;  Constitutional: Cachetic. In no acute distress; Head:  Normocephalic, atraumatic; Eyes: EOMI, PERRL, No scleral icterus; ENMT: Mouth and pharynx normal, Mucous membranes dry; Neck: Supple, Full  range of motion, No lymphadenopathy; Cardiovascular: Regular rate and rhythm, No gallop; Respiratory: Breath sounds clear & equal bilaterally, No wheezes.  Speaking full sentences with ease, Normal respiratory effort/excursion; Chest: Nontender, Movement normal; Abdomen: Soft, Nontender, Nondistended, Normal bowel sounds; Genitourinary: No CVA tenderness; Extremities: Peripheral pulses normal, NT left knee/ankle/foot. NMS intact left foot, strong palp pedal pp, LE muscle compartments soft.  No left proximal fibular head tenderness.  No deformity, no ecchymosis, no rash, no open wounds.  +plantarflexion of left foot w/calf squeeze.  No palpable gap left Achilles's tendon. No edema, No calf edema or asymmetry.; Neuro: AA&Ox3, +HOH, otherwise major CN grossly intact. No facial droop. Speech clear. No gross focal motor deficits in extremities.; Skin: Color normal, Warm, Dry.   ED Treatments / Results  Labs (all labs ordered are listed, but only abnormal results are displayed)   EKG EKG Interpretation  Date/Time:  Friday April 01 2017 11:59:56 EDT Ventricular Rate:  63 PR Interval:    QRS Duration: 89 QT Interval:  397 QTC Calculation: 407 R Axis:   27 Text Interpretation:  Sinus rhythm Probable left ventricular hypertrophy Anterior Q waves, possibly due to LVH Baseline wander When compared with ECG of 07/14/2013 Rate slower Confirmed by Francine Graven [phone]) on 04/01/2017 12:09:26 PM   Radiology   Procedures Procedures (including critical care time)  Medications Ordered in ED Medications  0.9 %  sodium chloride infusion (has no administration in time range)  0.9 %  sodium chloride infusion (0 mL/hr Intravenous Paused 04/01/17 1337)     Initial Impression / Assessment and Plan / ED Course  I have reviewed the triage vital signs and the nursing notes.  Pertinent labs & imaging results that were available during my care of the patient were reviewed by me and considered in my medical  decision making (see chart for details).  MDM Reviewed: previous chart, nursing note and vitals Reviewed previous: labs and ECG Interpretation: labs, ECG and x-ray Total time providing critical care: 30-74 minutes. This excludes time spent performing separately reportable procedures and services. Consults: admitting MD   CRITICAL CARE Performed by: Alfonzo Feller Total critical care time: 35 minutes Critical care time was exclusive of separately billable procedures and treating other  patients. Critical care was necessary to treat or prevent imminent or life-threatening deterioration. Critical care was time spent personally by me on the following activities: development of treatment plan with patient and/or surrogate as well as nursing, discussions with consultants, evaluation of patient's response to treatment, examination of patient, obtaining history from patient or surrogate, ordering and performing treatments and interventions, ordering and review of laboratory studies, ordering and review of radiographic studies, pulse oximetry and re-evaluation of patient's condition.   Results for orders placed or performed during the hospital encounter of 04/01/17  Comprehensive metabolic panel  Result Value Ref Range   Sodium 121 (L) 135 - 145 mmol/L   Potassium 4.0 3.5 - 5.1 mmol/L   Chloride 85 (L) 101 - 111 mmol/L   CO2 24 22 - 32 mmol/L   Glucose, Bld 96 65 - 99 mg/dL   BUN 9 6 - 20 mg/dL   Creatinine, Ser 0.50 0.44 - 1.00 mg/dL   Calcium 9.3 8.9 - 10.3 mg/dL   Total Protein 6.7 6.5 - 8.1 g/dL   Albumin 2.8 (L) 3.5 - 5.0 g/dL   AST 16 15 - 41 U/L   ALT 19 14 - 54 U/L   Alkaline Phosphatase 75 38 - 126 U/L   Total Bilirubin 0.3 0.3 - 1.2 mg/dL   GFR calc non Af Amer >60 >60 mL/min   GFR calc Af Amer >60 >60 mL/min   Anion gap 12 5 - 15  Lipase, blood  Result Value Ref Range   Lipase 30 11 - 51 U/L  Troponin I  Result Value Ref Range   Troponin I <0.03 <0.03 ng/mL  CBC with  Differential  Result Value Ref Range   WBC 7.2 4.0 - 10.5 K/uL   RBC 2.89 (L) 3.87 - 5.11 MIL/uL   Hemoglobin 7.3 (L) 12.0 - 15.0 g/dL   HCT 23.1 (L) 36.0 - 46.0 %   MCV 79.9 78.0 - 100.0 fL   MCH 25.3 (L) 26.0 - 34.0 pg   MCHC 31.6 30.0 - 36.0 g/dL   RDW 15.1 11.5 - 15.5 %   Platelets 561 (H) 150 - 400 K/uL   Neutrophils Relative % 83 %   Neutro Abs 6.1 1.7 - 7.7 K/uL   Lymphocytes Relative 10 %   Lymphs Abs 0.7 0.7 - 4.0 K/uL   Monocytes Relative 5 %   Monocytes Absolute 0.3 0.1 - 1.0 K/uL   Eosinophils Relative 1 %   Eosinophils Absolute 0.1 0.0 - 0.7 K/uL   Basophils Relative 1 %   Basophils Absolute 0.0 0.0 - 0.1 K/uL  Type and screen  Result Value Ref Range   ABO/RH(D) A POS    Antibody Screen NEG    Sample Expiration      04/04/2017 Performed at [facility], [address] [place], [place] [address]    Unit Number [id number]    Blood Component Type RED CELLS,LR    Unit division 00    Status of Unit ISSUED    Transfusion Status OK TO TRANSFUSE    Crossmatch Result Compatible    Unit Number [id number]    Blood Component Type RED CELLS,LR    Unit division 00    Status of Unit ALLOCATED    Transfusion Status OK TO TRANSFUSE    Crossmatch Result Compatible   Prepare RBC  Result Value Ref Range   Order Confirmation      ORDER PROCESSED BY BLOOD BANK Performed at [facility], [address] [place],  [place] [address]   BPAM RBC  Result Value Ref Range   ISSUE DATE / TIME [id number]    Blood Product Unit Number [id number]    PRODUCT CODE [id number]    Unit Type and Rh 7048    Blood Product Expiration Date 889169450388    Blood Product Unit Number [id number]    Unit Type and Rh 0569    Blood Product Expiration Date 794801655374    Dg Ankle Complete Left Result Date: 04/01/2017 CLINICAL DATA:  Pain EXAM: LEFT ANKLE COMPLETE - 3+ VIEW COMPARISON:  None. FINDINGS: There is no evidence of fracture, dislocation, or joint effusion. There is no evidence  of arthropathy or other focal bone abnormality. Soft tissues are unremarkable. IMPRESSION: Negative. Electronically Signed   By: Franchot Gallo M.D.   On: 04/01/2017 12:25   Dg Foot Complete Left Result Date: 04/01/2017 CLINICAL DATA:  Pain EXAM: LEFT FOOT - COMPLETE 3+ VIEW COMPARISON:  07/12/2013 FINDINGS: There is no evidence of fracture or dislocation. There is no evidence of arthropathy or other focal bone abnormality. Soft tissues are unremarkable. IMPRESSION: Negative. Electronically Signed   By: Franchot Gallo M.D.   On: 04/01/2017 12:26    Results for MARA, FAVERO (MRN [id number]) as of 04/01/2017 14:00  Ref. Range 05/15/2013 16:20 05/16/2013 05:00 05/17/2013 05:11 08/06/2013 13:26 04/01/2017 11:41  Hemoglobin Latest Ref Range: 12.0 - 15.0 g/dL 9.7 (L) 9.3 (L) 9.3 (L) 11.9 (L) 7.3 (L)  HCT Latest Ref Range: 36.0 - 46.0 % 27.0 (L) 26.6 (L) 27.5 (L) 35.1 (L) 23.1 (L)   Results for HYDEIA, MCATEE (MRN [id number]) as of 04/01/2017 14:00  Ref. Range 05/13/2013 10:53 05/14/2013 05:19 05/15/2013 05:23 05/16/2013 05:00 04/01/2017 11:41  Sodium Latest Ref Range: 135 - 145 mmol/L 136 (L) 139 140 141 121 (L)  Chloride Latest Ref Range: 101 - 111 mmol/L 100 109 108 111 85 (L)    1245:  H/H lower than previous. PRBC's ordered and started to transfuse. Hx gastric ulcer per family; IV protonix bolus and gtt ordered. Judicious IVF bolus and gtt ordered for hyponatremia and low BP. Dx and testing d/w pt and family.  Questions answered.  Verb understanding, agreeable to admit. T/C returned from [facility] Dr. Clementeen Graham, case discussed, including:  HPI, pertinent PM/SHx, VS/PE, dx testing, ED course and treatment:  Agreeable to admit.     Final Clinical Impressions(s) / ED Diagnoses   Final diagnoses:  Symptomatic anemia  Hyponatremia    ED Discharge Orders    None       Francine Graven, DO 04/03/17 1446

## 2017-04-01 NOTE — H&P (View-Only) (Signed)
Referring Provider: No ref. provider found Primary Care Physician:  Dione Housekeeper, MD Primary Gastroenterologist:  Dr. Gala Romney  Date of Admission: 04/01/17 Date of Consultation: 04/01/17  Reason for Consultation:  Melena, anemia, weight loss, hx PUD  HPI:  Jackie Walls is a 72 y.o. female with a past medical history of COPD, anxiety, chronic diarrhea, hypertension, iron deficiency anemia, peptic ulcer disease.  The patient was last seen in our office 03/14/2017 on referral from primary care for weight loss, diarrhea, rectal bleeding.  She was noted to have been worked up for diarrhea in 2015 with a colonoscopy finding slightly granular colonic mucosa diffusely with 2 tubular adenoma diminutive polyps in the mid descending segment.  Random colon biopsies at that time consistent with collagenous colitis.  She was started on Entocort but failed to follow-up.  Recommended repeat colonoscopy 2020.  History of perforated gastric ulcer secondary to NSAIDs in April 2015 requiring laparotomy and surgical repair.  H. pylori serologies were negative.  Repeat EGD in May 2015 for upper GI bleed also due to duodenal ulcers from NSAIDs.  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  She was recently seen by [facility] emergency department with complaints of black stools, diarrhea, abdominal pain, weight loss found to have a hemoglobin of 6.  She received 2 units of PRBCs and was transferred to [facility] for hospitalization for 2 days.  Discharge hemoglobin  8.8 with noted iron deficiency.  EGD on February 20 showed a large 4 cm, deep, cratered ulcer along the lesser curvature of the stomach but no sign of active bleeding.  H. pylori stool antigen negative.  While at [facility] on 02/21/2017 she had a CT of the chest, abdomen, pelvis.  Noted ascending thoracic aorta measuring 4.6 cm, minimal increased in size since 2016,  moderate to severe central lobular emphysema with diffuse bronchial wall thickening, atherosclerotic abdominal aorta with 4.7 cm in for renal abdominal aortic aneurysm increased from 4 cm back in 2015  At her last office visit with our office she was having 4-5 watery black stools daily, recently used Pepto-Bismol.  Hard of hearing.  Generalized abdominal pain but no reflux or dysphagia.  No bright red blood per rectum.  Eat small portions during the day.  Previously on Plavix and aspirin 325 mg daily although her Plavix was on hold at that time and her aspirin has been changed to 81 mg due to recent peptic ulcer disease.  She has had significant weight loss since 2014 as outlined below.  Recommended GI path panel, recheck hemoglobin along with tissue transglutaminase IgA and serum IgA, CTA of the abdomen and pelvis.  Recommended surveillance EGD in 3 months.  CTA has been prior auth improved and is awaiting scheduling.  Today she presented to the emergency department complaining of onset and persistence of "low blood levels" with noted drop in hemoglobin, primary care yesterday.  She was instructed to come to the emergency department.  Noted history of peptic ulcer disease, duodenal ulcers as outlined above.  Chronic "black" diarrhea per patient and  family.  Denies nausea vomiting, fevers, abdominal pain.  Labs in the emergency department found her sodium to be quite low at 121, albumin low at 2.8, liver function tests and creatinine normal.  However, her hemoglobin was low at 7.3 (compared to recent values as per above this appears to be a 1-2  g drop in hemoglobin).  White blood cell count normal.  She was started on a Protonix drip in the ER and ordered packed red blood cells.  Weight Loss History: February 2014: 140 pounds November 2014: 132 pounds April 2015: 123 pounds May 2015: 114 pounds July 2015: 109 pounds November 2016: 96 pounds March 2019: 83 pounds  85 pounds at [facility] 02/2017  Today she is accompanied by family. They state she has chronic black stool, seems worse lately. Loose stools after eating, "black as that monitor (computer)" and "like motor oil. Daily ASA 81 mg. Also occasionally takes Excedrin. No ASA powders. Some lower abdominal discomfort, less common epigastric discomfort (none now). Admits history of PUD. Denies weakness, fatigue, dizziness, lightheadedness, chest pain. Occasional yspnea when she takes her oxygen off. Denies hematochezia. No other GI complaints at this time.  Past Medical History:  Diagnosis Date  . Abdominal aortic aneurysm ([place])   . ALLERGIC RHINITIS   . Anxiety   . Bronchitis   . Chronic diarrhea   . COPD (chronic obstructive pulmonary disease) ([place])   . Hyperlipidemia   . Hypertension   . IDA (iron deficiency anemia)   . Thoracic aortic aneurysm [facility])     Past Surgical History:  Procedure Laterality Date  . CAROTID ENDARTERECTOMY Right   . CESAREAN SECTION    . COLONOSCOPY N/A 08/13/2013   [facility]:XNATFTD polyps-removed as described above. Subtlyabnormal colonic mucosa-status post segmental biopsy.  EGD not  . ESOPHAGOGASTRODUODENOSCOPY N/A 05/15/2013   Dr. Oneida Alar: moderate erosive gastritis, duodenal ulcers, visible vessel with active oozing s/p bleeding control therapy, NSAID induced gastritis  . ESOPHAGOGASTRODUODENOSCOPY N/A 08/13/2013   [facility]: EGD not needed and not done  . ESOPHAGOGASTRODUODENOSCOPY  02/23/2017   [facility]: arge 4 cm, deep, cratered ulcer along the lesser curvature of the stomach but no signs of active bleeding. H. pylori stool antigen is negative.   Marland Kitchen  GASTRORRHAPHY N/A 04/25/2013   Procedure: GASTRORRHAPHY;  Surgeon: Jamesetta So, MD;  Location: [facility];  Service: General;  Laterality: N/A;  . LAPAROTOMY N/A 04/25/2013   Procedure: EXPLORATORY LAPAROTOMY;  Surgeon: Jamesetta So, MD;  Location: [facility];  Service: General;  Laterality: N/A;    Prior to Admission medications   Medication Sig Start Date End Date Taking? Authorizing Provider  albuterol (PROVENTIL) (2.5 MG/3ML) 0.083% nebulizer solution Inhale 3 mLs into the lungs every 4 (four) hours. 02/28/17  Yes [provider]  alendronate (FOSAMAX) 70 MG tablet Take 70 mg by mouth once a week. Take with a full glass of water on an empty stomach. Takes on Sat.   Yes [provider]  amLODipine (NORVASC) 10 MG tablet Take 1 tablet by mouth daily. 02/28/17  Yes [provider]  aspirin EC 81 MG tablet Take 81 mg by mouth daily.   Yes [provider]  atorvastatin (LIPITOR) 40 MG tablet Take 40 mg by mouth daily.   Yes [provider]  budesonide-formoterol (SYMBICORT) 160-4.5 MCG/ACT inhaler Inhale 2 puffs into the lungs 2 (two) times daily.     Yes [provider]  clopidogrel (PLAVIX) 75 MG tablet Take 75 mg by mouth daily.   Yes  [provider]  doxazosin (CARDURA) 4 MG tablet Take 4 mg by mouth every morning.    Yes [provider]  hydrochlorothiazide (MICROZIDE) 12.5 MG capsule Take 1 capsule by mouth daily. 02/28/17  Yes [provider]  LORazepam (ATIVAN) 0.5 MG tablet Take 1 tablet by mouth every 8 (eight) hours as needed. 02/28/17  Yes [provider]  losartan (COZAAR) 50 MG tablet Take 100 mg by mouth every morning.    Yes [provider]  pantoprazole (PROTONIX) 40 MG tablet Take 40 mg by mouth 2 (two) times daily. 05/18/13  Yes Regalado, Belkys A, MD  vancomycin (VANCOCIN) 125 MG capsule Take 1 capsule (125 mg total) by mouth 4 (four) times daily. 03/18/17  Yes Mahala Menghini, PA-C   albuterol-ipratropium (COMBIVENT) 18-103 MCG/ACT inhaler Inhale 1 puff into the lungs every 6 (six) hours as needed for wheezing or shortness of breath. Patient not taking: Reported on 04/01/2017 05/18/13   Elmarie Shiley, MD    Current Facility-Administered Medications  Medication Dose Route Frequency Provider Last Rate Last Dose  . 0.9 %  sodium chloride infusion   Intravenous Continuous Francine Graven, DO      . pantoprazole (PROTONIX) 80 mg in sodium chloride 0.9 % 250 mL (0.32 mg/mL) infusion  8 mg/hr Intravenous Continuous Francine Graven, DO 25 mL/hr at 04/01/17 1459 8 mg/hr at 04/01/17 1459   Current Outpatient Medications  Medication Sig Dispense Refill  . albuterol (PROVENTIL) (2.5 MG/3ML) 0.083% nebulizer solution Inhale 3 mLs into the lungs every 4 (four) hours.    Marland Kitchen alendronate (FOSAMAX) 70 MG tablet Take 70 mg by mouth once a week. Take with a full glass of water on an empty stomach. Takes on Sat.    Marland Kitchen amLODipine (NORVASC) 10 MG tablet Take 1 tablet by mouth daily.    Marland Kitchen aspirin EC 81 MG tablet Take 81 mg by mouth daily.    Marland Kitchen atorvastatin (LIPITOR) 40 MG tablet Take 40 mg by mouth daily.    . budesonide-formoterol (SYMBICORT) 160-4.5 MCG/ACT inhaler Inhale 2 puffs into the lungs 2 (two) times daily.      . clopidogrel (PLAVIX) 75 MG tablet Take 75 mg by mouth daily.    Marland Kitchen doxazosin (CARDURA) 4 MG tablet Take 4 mg by mouth every morning.     . hydrochlorothiazide (MICROZIDE) 12.5 MG capsule Take 1 capsule by mouth daily.    Marland Kitchen LORazepam (ATIVAN) 0.5 MG tablet Take 1 tablet by mouth every 8 (eight) hours as needed.    Marland Kitchen losartan (COZAAR) 50 MG tablet Take 100 mg by mouth every morning.     . pantoprazole (PROTONIX) 40 MG tablet Take 40 mg by mouth 2 (two) times daily.    . vancomycin (VANCOCIN) 125 MG capsule Take 1 capsule (125 mg total) by mouth 4 (four) times daily. 56 capsule 0  . albuterol-ipratropium (COMBIVENT) 18-103 MCG/ACT inhaler Inhale 1 puff into the lungs every  6 (six) hours as needed for wheezing or shortness of breath. (Patient not taking: Reported on 04/01/2017) 1 Inhaler 0    Allergies as of 04/01/2017 - Review Complete 04/01/2017  Allergen Reaction Noted  . Ibuprofen Other (See Comments) 05/15/2013    Family History  Problem Relation Age of Onset  . Cancer Sister   . Colon cancer Neg Hx     Social History   Socioeconomic History  . Marital status: Married    Spouse name: Not on file  . Number of children: Not on file  .  Years of education: Not on file  . Highest education level: Not on file  Occupational History  . Not on file  Social Needs  . Financial resource strain: Not on file  . Food insecurity:    Worry: Not on file    Inability: Not on file  . Transportation needs:    Medical: Not on file    Non-medical: Not on file  Tobacco Use  . Smoking status: Current Every Day Smoker    Packs/day: 0.25    Types: Cigarettes    Start date: 01/05/1948  . Smokeless tobacco: Never Used  Substance and Sexual Activity  . Alcohol use: No  . Drug use: No  . Sexual activity: Not on file  Lifestyle  . Physical activity:    Days per week: Not on file    Minutes per session: Not on file  . Stress: Not on file  Relationships  . Social connections:    Talks on phone: Not on file    Gets together: Not on file    Attends religious service: Not on file    Active member of club or organization: Not on file    Attends meetings of clubs or organizations: Not on file    Relationship status: Not on file  . Intimate partner violence:    Fear of current or ex partner: Not on file    Emotionally abused: Not on file    Physically abused: Not on file    Forced sexual activity: Not on file  Other Topics Concern  . Not on file  Social History Narrative   Lives with her husband at home.    Review of Systems: General: Negative for anorexia, fever, chills, fatigue, weakness. Eyes: Negative for vision changes.  ENT: Negative for hoarseness,  difficulty swallowing. CV: Negative for chest pain, angina, palpitations, peripheral edema.  Respiratory: Negative for dyspnea at rest, cough, sputum, wheezing.  GI: See history of present illness. Derm: Negative for rash or itching.  Endo: Negative for unusual weight change.  Heme: Negative for bruising or bleeding. Allergy: Negative for rash or hives.  Physical Exam: Vital signs in last 24 hours: Temp:  [97.5 F (36.4 C)-98.4 F (36.9 C)] 97.8 F (36.6 C) (03/29 1536) Pulse Rate:  [58-76] 65 (03/29 1536) Resp:  [12-18] 18 (03/29 1536) BP: (91-119)/(55-64) 91/58 (03/29 1536) SpO2:  [96 %-98 %] 97 % (03/29 1430) Weight:  [89 lb (40.4 kg)] 89 lb (40.4 kg) (03/29 1136)   General:   Alert,  Well-developed, well-nourished, pleasant and cooperative in NAD Head:  Normocephalic and atraumatic. Eyes:  Sclera clear, no icterus. Conjunctiva pink. Ears:  Very hard of hearing. Neck:  Supple; no masses or thyromegaly. Lungs:  Clear throughout to auscultation. No wheezes, crackles, or rhonchi. No acute distress. Heart:  Regular rate and rhythm; no murmurs, clicks, rubs,  or gallops. Abdomen:  Soft and nondistended. Mild lower abdominal TTP. No masses, hepatosplenomegaly or hernias noted. Normal bowel sounds, without guarding, and without rebound.   Rectal:  Deferred.   Msk:  Symmetrical without gross deformities. Extremities:  Without clubbing or edema. Neurologic:  Alert and  oriented x4;  grossly normal neurologically. Skin:  Intact without significant lesions or rashes. Psych:  Alert and cooperative. Normal mood and affect.  Intake/Output from previous day: No intake/output data recorded. Intake/Output this shift: Total I/O In: 1315 [I.V.:500; Blood:315; IV Piggyback:500] Out: -   Lab Results: Recent Labs    04/01/17 1141  WBC 7.2  HGB 7.3*  HCT 23.1*  PLT 561*   BMET Recent Labs    04/01/17 1141  NA 121*  K 4.0  CL 85*  CO2 24  GLUCOSE 96  BUN 9  CREATININE 0.50   CALCIUM 9.3   LFT Recent Labs    04/01/17 1141  PROT 6.7  ALBUMIN 2.8*  AST 16  ALT 19  ALKPHOS 75  BILITOT 0.3   PT/INR No results for input(s): LABPROT, INR in the last 72 hours. Hepatitis Panel No results for input(s): HEPBSAG, HCVAB, HEPAIGM, HEPBIGM in the last 72 hours. C-Diff No results for input(s): CDIFFTOX in the last 72 hours.  Studies/Results: Dg Ankle Complete Left  Result Date: 04/01/2017 CLINICAL DATA:  Pain EXAM: LEFT ANKLE COMPLETE - 3+ VIEW COMPARISON:  None. FINDINGS: There is no evidence of fracture, dislocation, or joint effusion. There is no evidence of arthropathy or other focal bone abnormality. Soft tissues are unremarkable. IMPRESSION: Negative. Electronically Signed   By: Franchot Gallo M.D.   On: 04/01/2017 12:25   Dg Foot Complete Left  Result Date: 04/01/2017 CLINICAL DATA:  Pain EXAM: LEFT FOOT - COMPLETE 3+ VIEW COMPARISON:  07/12/2013 FINDINGS: There is no evidence of fracture or dislocation. There is no evidence of arthropathy or other focal bone abnormality. Soft tissues are unremarkable. IMPRESSION: Negative. Electronically Signed   By: Franchot Gallo M.D.   On: 04/01/2017 12:26    Impression: Pleasant 72 year old female with a significant history fo PUD s/p surgical repair, duodenal ulcer, and recurrend gastric ulcer (1.5 to 2 months ago s/p EGD at [facility]); All due to NSAIDs. On ASA 81 mg and occasional Excedrin. Chronic dark stools, family states stools after every meal, darker "like motor oil." Denies symptoms of acute anemia but has chronic anemia and likely some tolerance of changes in hgb. With weight loss (significant), generalized abdominal pain, known vascular deficiencies there is some concern for chronic mesenteric ischemia with an outpatient CTA A/P planed (but not scheduled yet.)  Additionally, she is completing treatment for +CDiff PCR and presumed CDiff due to symptoms. Not toxic appearing at this time, almost completed treatment  per family (unsure of exact number of days remaining).  Overall likely UGI Bleed with differentials including recurrence of bleeding PUD, duodenal ulcer, gastritis, esophagitis. Less likely AVM, GI malignancy. Consider concomitant chronic mesenteric ischemia being worked up as outpatient.    Plan: 1. Agree with PPI gtt at this time 2. Avoid all NSAIDs for now 3. Consider IP EGD for likely UGI bleed 4. Monitor H/H closely 5. Monitor for GI bleed 6. Continue vancomycin 7. Transfuse as necessary 8. Supportive measures   Thank you for allowing [place] to participate in the care of Jorene Guest, DNP, AGNP-C Adult & Gerontological Nurse Practitioner [facility]   LOS: 0 days     04/01/2017, 3:53 PM

## 2017-04-02 ENCOUNTER — Encounter (HOSPITAL_COMMUNITY)
Admission: EM | Disposition: A | Discharge status: Home Health | Payer: Self-pay | Source: Home / Self Care | Attending: Internal Medicine

## 2017-04-02 ENCOUNTER — Encounter (HOSPITAL_COMMUNITY): Payer: Self-pay | Admitting: *Deleted

## 2017-04-02 DIAGNOSIS — K253 Acute gastric ulcer without hemorrhage or perforation: Secondary | ICD-10-CM

## 2017-04-02 DIAGNOSIS — K2970 Gastritis, unspecified, without bleeding: Secondary | ICD-10-CM

## 2017-04-02 DIAGNOSIS — K259 Gastric ulcer, unspecified as acute or chronic, without hemorrhage or perforation: Principal | ICD-10-CM

## 2017-04-02 DIAGNOSIS — K921 Melena: Secondary | ICD-10-CM

## 2017-04-02 HISTORY — PX: ESOPHAGOGASTRODUODENOSCOPY: SHX5428

## 2017-04-02 HISTORY — PX: BIOPSY: SHX5522

## 2017-04-02 LAB — HEMOGLOBIN AND HEMATOCRIT, BLOOD
HCT: 21.7 % — ABNORMAL LOW (ref 36.0–46.0)
HEMATOCRIT: 22.6 % — AB (ref 36.0–46.0)
HEMATOCRIT: 28.6 % — AB (ref 36.0–46.0)
HEMOGLOBIN: 7.1 g/dL — AB (ref 12.0–15.0)
HEMOGLOBIN: 9.4 g/dL — AB (ref 12.0–15.0)
Hemoglobin: 7 g/dL — ABNORMAL LOW (ref 12.0–15.0)

## 2017-04-02 LAB — PREPARE RBC (CROSSMATCH)

## 2017-04-02 LAB — BASIC METABOLIC PANEL
Anion gap: 9 (ref 5–15)
BUN: 6 mg/dL (ref 6–20)
CALCIUM: 8.2 mg/dL — AB (ref 8.9–10.3)
CO2: 23 mmol/L (ref 22–32)
CREATININE: 0.41 mg/dL — AB (ref 0.44–1.00)
Chloride: 95 mmol/L — ABNORMAL LOW (ref 101–111)
GFR calc Af Amer: >60 mL/min (ref >60)
Glucose, Bld: 81 mg/dL (ref 65–99)
Potassium: 3.7 mmol/L (ref 3.5–5.1)
SODIUM: 127 mmol/L — AB (ref 135–145)

## 2017-04-02 LAB — IGA: IgA: 206 mg/dL (ref 64–422)

## 2017-04-02 LAB — MRSA PCR SCREENING: MRSA BY PCR: NEGATIVE

## 2017-04-02 SURGERY — EGD (ESOPHAGOGASTRODUODENOSCOPY)
Anesthesia: Moderate Sedation

## 2017-04-02 MED ORDER — PANTOPRAZOLE SODIUM 40 MG PO TBEC
40.0000 mg | DELAYED_RELEASE_TABLET | Freq: Two times a day (BID) | ORAL | Status: DC
  Administered 2017-04-03 – 2017-04-04 (×3): 40 mg via ORAL
  Filled 2017-04-02 (×3): qty 1

## 2017-04-02 MED ORDER — LIDOCAINE VISCOUS 2 % MT SOLN
OROMUCOSAL | Status: AC
  Filled 2017-04-02: qty 15

## 2017-04-02 MED ORDER — LIDOCAINE VISCOUS 2 % MT SOLN
OROMUCOSAL | Status: DC | PRN
  Administered 2017-04-02: 1 via OROMUCOSAL

## 2017-04-02 MED ORDER — MIDAZOLAM HCL 5 MG/5ML IJ SOLN
INTRAMUSCULAR | Status: AC
  Filled 2017-04-02: qty 10

## 2017-04-02 MED ORDER — MEPERIDINE HCL 100 MG/ML IJ SOLN
INTRAMUSCULAR | Status: DC | PRN
  Administered 2017-04-02: 25 mg via INTRAVENOUS

## 2017-04-02 MED ORDER — MIDAZOLAM HCL 5 MG/5ML IJ SOLN
INTRAMUSCULAR | Status: DC | PRN
  Administered 2017-04-02: 2 mg via INTRAVENOUS
  Administered 2017-04-02 (×2): 1 mg via INTRAVENOUS

## 2017-04-02 MED ORDER — SODIUM CHLORIDE 0.9 % IV SOLN
8.0000 mg/h | INTRAVENOUS | Status: AC
  Administered 2017-04-02: 8 mg/h via INTRAVENOUS
  Filled 2017-04-02: qty 80

## 2017-04-02 MED ORDER — SODIUM CHLORIDE 0.9 % IV SOLN
Freq: Once | INTRAVENOUS | Status: AC
  Administered 2017-04-02: 09:00:00 via INTRAVENOUS

## 2017-04-02 MED ORDER — MEPERIDINE HCL 100 MG/ML IJ SOLN
INTRAMUSCULAR | Status: AC
  Filled 2017-04-02: qty 2

## 2017-04-02 MED ORDER — SODIUM CHLORIDE 0.9 % IV SOLN: INTRAVENOUS | Status: DC

## 2017-04-02 NOTE — Op Note (Signed)
[facility] Patient Name: Jackie Walls Procedure Date: 04/02/2017 8:56 AM MRN: [id number] Date of Birth: September 20, 1945 Attending MD: Barney Drain MD, MD CSN: [id number] Age: 72 Admit Type: Inpatient Procedure:                Upper GI endoscopy with COLD FORCEPS BIOPSY Indications:              Melena-LAST EGD FEB 2019 [facility]: LARGE GASTRIC ULCER.                            PLAVIX ON HOLD SINCE FEB 2019. ASA 81 MG CONTINUED. Providers:                Barney Drain MD, MD, Lurline Del, RN, Charlsie Quest                            Theda Sers RN, RN Referring MD:             Dione Housekeeper Medicines:                Meperidine 25 mg IV, Midazolam 4 mg IV Complications:            No immediate complications. Estimated Blood Loss:     Estimated blood loss was minimal. Procedure:                Pre-Anesthesia Assessment:                           - Prior to the procedure, a History and Physical                            was performed, and patient medications and                            allergies were reviewed. The patient's tolerance of                            previous anesthesia was also reviewed. The risks                            and benefits of the procedure and the sedation                            options and risks were discussed with the patient.                            All questions were answered, and informed consent                            was obtained. Prior Anticoagulants: The patient                            last took aspirin 1 day and Plavix (clopidogrel) 1                            day prior to the procedure. ASA Grade Assessment:  II - A patient with mild systemic disease. After                            reviewing the risks and benefits, the patient was                            deemed in satisfactory condition to undergo the                            procedure. After obtaining informed consent, the                            endoscope  was passed under direct vision.                            Throughout the procedure, the patient's blood                            pressure, pulse, and oxygen saturations were                            monitored continuously. The [id number] ([id number])                            scope was introduced through the mouth, and                            advanced to the second part of duodenum. The upper                            GI endoscopy was accomplished without difficulty.                            The patient tolerated the procedure well. Scope In: 9:48:35 AM Scope Out: 10:05:59 AM Total Procedure Duration: 0 hours 17 minutes 24 seconds  Findings:      The examined esophagus was normal.      One non-bleeding cratered gastric ulcer with no stigmata of bleeding was       found on the greater curvature of the stomach. The lesion was thirty mm       by thirty X 40 mm in largest dimension. This was biopsied EDGED OF ULCER       with a cold forceps for histology.      Diffuse mild inflammation characterized by congestion (edema) and       erythema was found in the entire examined stomach. Biopsies were taken       with a cold forceps for Helicobacter pylori testing.      The examined duodenum was normal. Impression:               .                           - MELENA AND ANEMIA DUE TO LARGE gastric ulcer.                           -  MILD Gastritis. Moderate Sedation:      Moderate (conscious) sedation was administered by the endoscopy nurse       and supervised by the endoscopist. The following parameters were       monitored: oxygen saturation, heart rate, blood pressure, and response       to care. Total physician intraservice time was 26 minutes. Recommendation:           - Await pathology results.                           - Continue present medications. AVOID ASA                            INDEFINITELY. CONSIDER PLAVIX WHEN PT NO LONGER                            HAVING BLACK STOOL.  NEEDS CBC Q2-4 WEEKS                           - Use Protonix (pantoprazole) 40 mg PO BID for 3                            months.                           - Mechanical soft diet.                           - Repeat upper endoscopy in 3 months for                            surveillance.                           - Return patient to hospital ward for ongoing care. Procedure Code(s):        --- Professional ---                           [phone], Esophagogastroduodenoscopy, flexible,                            transoral; with biopsy, single or multiple                           99152, Moderate sedation services provided by the                            same physician or other qualified health care                            professional performing the diagnostic or                            therapeutic service that the sedation supports,  requiring the presence of an independent trained                            observer to assist in the monitoring of the                            patient's level of consciousness and physiological                            status; initial 15 minutes of intraservice time,                            patient age 36 years or older                           [phone], Moderate sedation services; each additional                            15 minutes intraservice time Diagnosis Code(s):        --- Professional ---                           K25.9, Gastric ulcer, unspecified as acute or                            chronic, without hemorrhage or perforation                           K29.70, Gastritis, unspecified, without bleeding                           K92.1, Melena (includes Hematochezia) CPT copyright 2016 American Medical Association. All rights reserved. The codes documented in this report are preliminary and upon coder review may  be revised to meet current compliance requirements. Barney Drain, MD Barney Drain MD, MD 04/02/2017  10:41:32 AM This report has been signed electronically. Number of Addenda: 0

## 2017-04-02 NOTE — Progress Notes (Signed)
Patient wished to eat lunch at 1203 rather than take breathing treatment at that time. Patient appeared in no respiratory distress. I shall return later this afternoon for scheduled nebulizer treatment.

## 2017-04-02 NOTE — Progress Notes (Signed)
PROGRESS NOTE                                                                                                                                                                                                             Patient Demographics:    Jackie Walls, is a 72 y.o. female, DOB - [birth year], KXF:[id number]  Admit date - 04/01/2017   Admitting Physician Louellen Molder, MD  Outpatient Primary MD for the patient is Dione Housekeeper, MD  LOS - 1  Outpatient Specialists: [facility]  GI  Chief Complaint  Patient presents with  . Abnormal Lab       Brief Narrative   72 year old female with thoracic and abdominal aortic aneurysm, chronic diarrhea, COPD, hypertension, hyperlipidemia, iron deficiency anemia with recent hospitalization at [facility] for abdominal pain, weight loss and dark stool with findings of blood loss anemia requiring transfusion and transferred to [facility] where she underwent EGD showing nonbleeding ulcer over lesser curvature of stomach.  She was then recently seen in the GI clinic for dark watery stool with positive C. difficile toxin and prescribed 14-day course of vancomycin. Patient saw her PCP on the day prior to admission where labs were done showing drop in H&H from baseline.  Family informs that patient has had progressive weight loss and poor p.o. intake with complaints of abdominal discomfort after meals. In the ED she had soft blood pressure with blood work showing hemoglobin to 7.3 from a baseline around 9 and sodium of 121.  Patient admitted to hospitalist service and given 1 unit PRBC.  Placed on Protonix drip and GI consulted.   Subjective:   Reports mild abdominal discomfort.  No bowel movement since yesterday.   Assessment  & Plan :    Principal Problem:   Upper GI bleeding Acute on chronic blood loss anemia. -On Protonix drip.  Hemoglobin still 7 after receiving 1 unit  PRBC.  Ordered another 2 units.  GI consult appreciated.   -EGD done today showing one nonbleeding cratered gastric ulcer with no stigmata for bleeding on the greater curvature of stomach which was biopsied.  Also shows diffuse mild inflammation with congestion and erythema in the entire stomach. -GI recommends : avoiding aspirin indefinitely ,  consider Plavix when patient no longer having black stools.  PPI twice daily for 3 months and repeat EGD  in 3 months for surveillance.   Active Problems:   Hyponatremia Likely hypovolemic and improving with IV fluids.  Urine sodium normal with low serum osmolality.    COPD (chronic obstructive pulmonary disease) (HCC) Stable.  Continue home inhalers.     Abnormal weight loss less/   protein calorie malnutrition, severe Progressive weight loss over the past few years.  IgA normal.  TTG IgA pending.  CT angiogram of the abdomen and pelvis done as per GI recommendations to rule out mesenteric ischemia and shows advanced atherosclerotic calcification of the abdominal aorta and mesenteric vessels with a 4.6 cm partially thrombosed infrarenal abdominal aortic aneurysm similar to prior CT.  No aneurysm rupture or hemorrhage.  Recommends follow-up CTA in 6 months and referral to vascular surgery. 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 Nutrition consult.  Recent C. difficile enteritis Was prescribed 2 weeks course of vancomycin by GI and I think she has completed it.  No further diarrhea.  Will discontinue vancomycin.  Essential hypertension Blood pressure medications held due to soft blood pressure.       Code Status : Full code  Family Communication  : Discussed with son and daughter  Disposition Plan  : Home pending clinical improvement  Barriers For Discharge : Active symptoms  Consults  : GI  Procedures  : EGD/CT angiogram of the abdomen  DVT Prophylaxis  : SCDs  Lab Results  Component Value Date   PLT 561  (H) 04/01/2017    Antibiotics  :    Anti-infectives (From admission, onward)   None        Objective:   Vitals:   04/02/17 1041 04/02/17 1042 04/02/17 1045 04/02/17 1108  BP:   102/63 (!) 101/50  Pulse: 61 62 (!) 59 64  Resp: 13 13 13 18   Temp:    97.7 F (36.5 C)  TempSrc:    Oral  SpO2: 97% 96% 96% 94%  Weight:      Height:        Wt Readings from Last 3 Encounters:  04/01/17 39.5 kg (87 lb 1.3 oz)  03/14/17 38 kg (83 lb 12.8 oz)  11/08/14 43.8 kg (96 lb 9.6 oz)     Intake/Output Summary (Last 24 hours) at 04/02/2017 1119 Last data filed at 04/02/2017 0800 Gross per 24 hour  Intake 2451.09 ml  Output 300 ml  Net 2151.09 ml     Physical Exam  Gen: not in distress, hard of hearing HEENT: Pallor present, temporal wasting, dry mucosa, supple neck Chest: clear b/l, no added sounds CVS: N S1&S2, no murmurs,  GI: soft, nondistended, bowel sounds present, nontender Musculoskeletal: warm, no edema     Data Review:    CBC Recent Labs  Lab 04/01/17 1141 04/01/17 2252 04/02/17 0536  WBC 7.2  --   --   HGB 7.3* 7.3* 7.1*  HCT 23.1* 23.0* 22.6*  PLT 561*  --   --   MCV 79.9  --   --   MCH 25.3*  --   --   MCHC 31.6  --   --   RDW 15.1  --   --   LYMPHSABS 0.7  --   --   MONOABS 0.3  --   --   EOSABS 0.1  --   --   BASOSABS 0.0  --   --     Chemistries  Recent Labs  Lab 04/01/17 1141 04/02/17 0536  NA 121* 127*  K 4.0 3.7  CL 85* 95*  CO2 24 23  GLUCOSE 96 81  BUN 9 6  CREATININE 0.50 0.41*  CALCIUM 9.3 8.2*  AST 16  --   ALT 19  --   ALKPHOS 75  --   BILITOT 0.3  --    ------------------------------------------------------------------------------------------------------------------ No results for input(s): CHOL, HDL, LDLCALC, TRIG, CHOLHDL, LDLDIRECT in the last 72 hours.  No results found for: HGBA1C ------------------------------------------------------------------------------------------------------------------ No results for  input(s): TSH, T4TOTAL, T3FREE, THYROIDAB in the last 72 hours.  Invalid input(s): FREET3 ------------------------------------------------------------------------------------------------------------------ No results for input(s): VITAMINB12, FOLATE, FERRITIN, TIBC, IRON, RETICCTPCT in the last 72 hours.  Coagulation profile No results for input(s): INR, PROTIME in the last 168 hours.  No results for input(s): DDIMER in the last 72 hours.  Cardiac Enzymes Recent Labs  Lab 04/01/17 1141  TROPONINI <0.03   ------------------------------------------------------------------------------------------------------------------ No results found for: BNP  Inpatient Medications  Scheduled Meds: . albuterol  3 mL Inhalation QID  . atorvastatin  40 mg Oral q1800  . lidocaine      . meperidine      . midazolam      . mometasone-formoterol  2 puff Inhalation BID   Continuous Infusions: . sodium chloride 100 mL/hr at 04/01/17 1841  . sodium chloride    . pantoprozole (PROTONIX) infusion 8 mg/hr (04/01/17 1459)   PRN Meds:.acetaminophen **OR** acetaminophen, LORazepam, ondansetron **OR** ondansetron (ZOFRAN) IV  Micro Results Recent Results (from the past 240 hour(s))  MRSA PCR Screening     Status: None   Collection Time: 04/01/17  8:37 PM  Result Value Ref Range Status   MRSA by PCR NEGATIVE NEGATIVE Final    Comment:        The GeneXpert MRSA Assay (FDA approved for NASAL specimens only), is one component of a comprehensive MRSA colonization surveillance program. It is not intended to diagnose MRSA infection nor to guide or monitor treatment for MRSA infections. Performed at [facility], [address], [place], [place] [address]     Radiology Reports Dg Ankle Complete Left  Result Date: 04/01/2017 CLINICAL DATA:  Pain EXAM: LEFT ANKLE COMPLETE - 3+ VIEW COMPARISON:  None. FINDINGS: There is no evidence of fracture, dislocation, or joint effusion. There is no evidence of  arthropathy or other focal bone abnormality. Soft tissues are unremarkable. IMPRESSION: Negative. Electronically Signed   By: Franchot Gallo M.D.   On: 04/01/2017 12:25   Dg Foot Complete Left  Result Date: 04/01/2017 CLINICAL DATA:  Pain EXAM: LEFT FOOT - COMPLETE 3+ VIEW COMPARISON:  07/12/2013 FINDINGS: There is no evidence of fracture or dislocation. There is no evidence of arthropathy or other focal bone abnormality. Soft tissues are unremarkable. IMPRESSION: Negative. Electronically Signed   By: Franchot Gallo M.D.   On: 04/01/2017 12:26   Ct Angio Abd/pel W/ And/or W/o  Result Date: 04/02/2017 CLINICAL DATA:  72 year old female with drop in hemoglobin. EXAM: CTA ABDOMEN AND PELVIS wITHOUT AND WITH CONTRAST TECHNIQUE: Multidetector CT imaging of the abdomen and pelvis was performed using the standard protocol during bolus administration of intravenous contrast. Multiplanar reconstructed images and MIPs were obtained and reviewed to evaluate the vascular anatomy. CONTRAST:  162mL ISOVUE-370 IOPAMIDOL (ISOVUE-370) INJECTION 76% COMPARISON:  CT of the abdomen pelvis dated 02/21/2017 FINDINGS: Evaluation is limited due to diffuse subcutaneous edema as well as paucity of intra-abdominal fat and cachexia. VASCULAR Aorta: There is advanced atherosclerotic calcification of the abdominal aorta. A 4.6 cm infrarenal abdominal aortic aneurysm with peripheral mural thrombus similar to  prior CT. No evidence of rupture. No contrast noted outside of the confines of the aorta. Celiac: There is atherosclerotic calcification of the celiac axis. There is a focal area of high-grade narrowing at the origin of the celiac artery with mild focal post stenotic dilatation of the vessel up to 8 mm in diameter. The celiac axis remains patent. SMA: There is atherosclerotic calcification of the SMA. Focal high-grade narrowing of the proximal SMA with post stenotic dilatation of the vessel up to 11 mm in diameter. The SMA remains  patent. Renals: There is atherosclerotic calcification of the renal ostia. 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 The renal arteries remain patent. IMA: Not visualized. Inflow: There is advanced atherosclerotic calcification of the iliac arteries. 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 The right common iliac artery, internal and external iliac artery branches remain patent. 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 There is reconstitution of the flow in the bifurcation of the left common iliac artery and the internal and external iliac arteries. Proximal Outflow: Advanced atherosclerotic calcification of the visualized proximal common femoral arteries. Veins: No obvious venous abnormality within the limitations of this arterial phase study. Review of the MIP images confirms the above findings. NON-VASCULAR No intra-abdominal free air.  Small free fluid within the pelvis. Lower chest: The visualized lung bases are clear. Hepatobiliary: The liver is unremarkable as visualized. The gallbladder is grossly unremarkable. Pancreas: Unremarkable. No pancreatic ductal dilatation or surrounding inflammatory changes. Spleen: Normal in size without focal abnormality. Adrenals/Urinary Tract: Mild left renal parenchyma atrophy and cortical thinning. There is no hydronephrosis or obstructing stone on either side. There is symmetric enhancement of kidneys. Small bilateral renal cysts as seen on the prior CT. The urinary bladder is distended and grossly unremarkable. Stomach/Bowel: There is moderate amount of dense stool throughout the colon. There is no bowel dilatation or evidence of obstruction. Lymphatic: No definite enlarged lymph nodes. Reproductive: The uterus is grossly unremarkable. Other: Diffuse subcutaneous edema and cachexia. Musculoskeletal: Osteopenia with degenerative changes of the spine. Old healed bilateral pubic bone  fractures. No acute osseous pathology. IMPRESSION: VASCULAR 1. Advanced atherosclerotic calcification of the abdominal aorta and mesenteric vasculature. There is a 4.6 cm partially thrombosed infra-abdominal aortic aneurysm similar to prior CT. No aneurysmal rupture. Recommend followup by abdomen and pelvis CTA in 6 months, and vascular surgery referral/consultation if not already obtained. This recommendation follows ACR consensus guidelines: White Paper of the ACR Incidental Findings Committee II on Vascular Findings. J Am Coll Radiol 2013; 10:789-794. 2. No evidence intraperitoneal hemorrhage. 3. 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 4. 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 NON-VASCULAR 1. No bowel obstruction. 2. Small free fluid within the pelvis of indeterminate etiology. Clinical correlation is recommended. 3. Diffuse edema, and anasarca, and cachexia. Electronically Signed   By: Anner Crete M.D.   On: 04/02/2017 00:13    Time Spent in minutes  25   Thaddeaus Monica M.D on 04/02/2017 at 11:19 AM  Between 7am to 7pm - Pager - [phone]  After 7pm go to [email or web address] - password [facility]  [facility] -  Office  [phone]

## 2017-04-02 NOTE — Interval H&P Note (Signed)
History and Physical Interval Note:  04/02/2017 9:28 AM  Jackie Walls  has presented today for surgery, with the diagnosis of melena  The various methods of treatment have been discussed with the patient and family. After consideration of risks, benefits and other options for treatment, the patient has consented to  Procedure(s): ESOPHAGOGASTRODUODENOSCOPY (EGD) (N/A) as a surgical intervention .  The patient's history has been reviewed, patient examined, no change in status, stable for surgery.  I have reviewed the patient's chart and labs.  Questions were answered to the patient's satisfaction.     [employer]

## 2017-04-03 DIAGNOSIS — E876 Hypokalemia: Secondary | ICD-10-CM

## 2017-04-03 LAB — BASIC METABOLIC PANEL
Anion gap: 9 (ref 5–15)
CHLORIDE: 100 mmol/L — AB (ref 101–111)
CO2: 22 mmol/L (ref 22–32)
CREATININE: 0.4 mg/dL — AB (ref 0.44–1.00)
Calcium: 8.2 mg/dL — ABNORMAL LOW (ref 8.9–10.3)
GFR calc Af Amer: >60 mL/min (ref >60)
GFR calc non Af Amer: >60 mL/min (ref >60)
Glucose, Bld: 84 mg/dL (ref 65–99)
Potassium: 3.2 mmol/L — ABNORMAL LOW (ref 3.5–5.1)
SODIUM: 131 mmol/L — AB (ref 135–145)

## 2017-04-03 LAB — CBC
HEMATOCRIT: 29.2 % — AB (ref 36.0–46.0)
HEMOGLOBIN: 9.3 g/dL — AB (ref 12.0–15.0)
MCH: 26 pg (ref 26.0–34.0)
MCHC: 31.8 g/dL (ref 30.0–36.0)
MCV: 81.6 fL (ref 78.0–100.0)
Platelets: 446 10*3/uL — ABNORMAL HIGH (ref 150–400)
RBC: 3.58 MIL/uL — ABNORMAL LOW (ref 3.87–5.11)
RDW: 14.9 % (ref 11.5–15.5)
WBC: 10 10*3/uL (ref 4.0–10.5)

## 2017-04-03 MED ORDER — POTASSIUM CHLORIDE CRYS ER 20 MEQ PO TBCR
40.0000 meq | EXTENDED_RELEASE_TABLET | Freq: Once | ORAL | Status: AC
  Administered 2017-04-03: 40 meq via ORAL
  Filled 2017-04-03: qty 2

## 2017-04-03 NOTE — Progress Notes (Signed)
PROGRESS NOTE                                                                                                                                                                                                             Patient Demographics:    Jackie Walls, is a 72 y.o. female, DOB - 08-08-1945, WCB:[id number]  Admit date - 04/01/2017   Admitting Physician Louellen Molder, MD  Outpatient Primary MD for the patient is Dione Housekeeper, MD  LOS - 2  Outpatient Specialists: [facility]  GI  Chief Complaint  Patient presents with  . Abnormal Lab       Brief Narrative   72 year old female with thoracic and abdominal aortic aneurysm, chronic diarrhea, COPD, hypertension, hyperlipidemia, iron deficiency anemia with recent hospitalization at [facility] for abdominal pain, weight loss and dark stool with findings of blood loss anemia requiring transfusion and transferred to [facility] where she underwent EGD showing nonbleeding ulcer over lesser curvature of stomach.  She was then recently seen in the GI clinic for dark watery stool with positive C. difficile toxin and prescribed 14-day course of vancomycin. Patient saw her PCP on the day prior to admission where labs were done showing drop in H&H from baseline.  Family informs that patient has had progressive weight loss and poor p.o. intake with complaints of abdominal discomfort after meals. In the ED she had soft blood pressure with blood work showing hemoglobin to 7.3 from a baseline around 9 and sodium of 121.  Patient admitted to hospitalist service and given 1 unit PRBC.  Placed on Protonix drip and GI consulted.   Subjective:   Seen and examined.  Feels better.  H&H improved with transfusion.   Assessment  & Plan :    Principal Problem:   Upper GI bleeding Acute on chronic blood loss anemia. -Hemoglobin improved to 9.3 after receiving total 3 unit PRBC.   Denies any abdominal pain.    -EGD done showing one nonbleeding cratered gastric ulcer with no stigmata for bleeding on the greater curvature of stomach which was biopsied.  Also shows diffuse mild inflammation with congestion and erythema in the entire stomach.  -GI recommends : avoiding aspirin indefinitely ,  consider Plavix when patient no longer having black stools.  PPI twice daily for 3 months and repeat EGD in 3 months for  surveillance.   Active Problems:   Hyponatremia Likely hypovolemic and improving with IV fluids.  Urine sodium normal with low serum osmolality.  Hypokalemia Replenished     COPD (chronic obstructive pulmonary disease) (HCC) Stable.  Continue home inhalers.     Abnormal weight loss less/   protein calorie malnutrition, severe Progressive weight loss over the past few years.  IgA normal.  TTG IgA pending.  CT angiogram of the abdomen and pelvis done as per GI recommendations to rule out mesenteric ischemia and shows advanced atherosclerotic calcification of the abdominal aorta and mesenteric vessels with a 4.6 cm partially thrombosed infrarenal abdominal aortic aneurysm similar to prior CT.  No aneurysm rupture or hemorrhage.  Recommends follow-up CTA in 6 months and referral to vascular surgery. 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 Nutrition consult.  Recent C. difficile enteritis Was prescribed 2 weeks course of vancomycin by GI and I think she has completed it.  No further diarrhea.    Essential hypertension Blood pressure medications held due to soft blood pressure.       Code Status : Full code  Family Communication  : Discussed with son and daughter on 3/30  Disposition Plan  : Home tomorrow if H&H stable.  Barriers For Discharge : Active symptoms  Consults  : GI  Procedures  : EGD/CT angiogram of the abdomen  DVT Prophylaxis  : SCDs  Lab Results  Component Value Date   PLT 446 (H) 04/03/2017     Antibiotics  :    Anti-infectives (From admission, onward)   None        Objective:   Vitals:   04/02/17 2011 04/02/17 2236 04/03/17 0539 04/03/17 0742  BP:  (!) 105/44 (!) 124/58   Pulse:  70 73   Resp:  18 18   Temp:  98.4 F (36.9 C) 99.4 F (37.4 C)   TempSrc:  Oral Oral   SpO2: 92% 91% 99% 98%  Weight:      Height:        Wt Readings from Last 3 Encounters:  04/01/17 39.5 kg (87 lb 1.3 oz)  03/14/17 38 kg (83 lb 12.8 oz)  11/08/14 43.8 kg (96 lb 9.6 oz)     Intake/Output Summary (Last 24 hours) at 04/03/2017 1110 Last data filed at 04/02/2017 2236 Gross per 24 hour  Intake 2480.42 ml  Output 1250 ml  Net 1230.42 ml     Physical Exam General: Elderly female not in distress, hard of hearing HEENT: Pallor present, moist mucosa, supple neck, temporal wasting Chest: Clear bilaterally CVS: Normal S1 and S2, no murmurs GI: Soft, nondistended, nontender, bowel sounds present Musculoskeletal: Warm, no edema      Data Review:    CBC Recent Labs  Lab 04/01/17 1141 04/01/17 2252 04/02/17 0536 04/02/17 1114 04/02/17 2006 04/03/17 0447  WBC 7.2  --   --   --   --  10.0  HGB 7.3* 7.3* 7.1* 7.0* 9.4* 9.3*  HCT 23.1* 23.0* 22.6* 21.7* 28.6* 29.2*  PLT 561*  --   --   --   --  446*  MCV 79.9  --   --   --   --  81.6  MCH 25.3*  --   --   --   --  26.0  MCHC 31.6  --   --   --   --  31.8  RDW 15.1  --   --   --   --  14.9  LYMPHSABS 0.7  --   --   --   --   --   MONOABS 0.3  --   --   --   --   --   EOSABS 0.1  --   --   --   --   --   BASOSABS 0.0  --   --   --   --   --     Chemistries  Recent Labs  Lab 04/01/17 1141 04/02/17 0536 04/03/17 0447  NA 121* 127* 131*  K 4.0 3.7 3.2*  CL 85* 95* 100*  CO2 24 23 22   GLUCOSE 96 81 84  BUN 9 6 <5*  CREATININE 0.50 0.41* 0.40*  CALCIUM 9.3 8.2* 8.2*  AST 16  --   --   ALT 19  --   --   ALKPHOS 75  --   --   BILITOT 0.3  --   --     ------------------------------------------------------------------------------------------------------------------ No results for input(s): CHOL, HDL, LDLCALC, TRIG, CHOLHDL, LDLDIRECT in the last 72 hours.  No results found for: HGBA1C ------------------------------------------------------------------------------------------------------------------ No results for input(s): TSH, T4TOTAL, T3FREE, THYROIDAB in the last 72 hours.  Invalid input(s): FREET3 ------------------------------------------------------------------------------------------------------------------ No results for input(s): VITAMINB12, FOLATE, FERRITIN, TIBC, IRON, RETICCTPCT in the last 72 hours.  Coagulation profile No results for input(s): INR, PROTIME in the last 168 hours.  No results for input(s): DDIMER in the last 72 hours.  Cardiac Enzymes Recent Labs  Lab 04/01/17 1141  TROPONINI <0.03   ------------------------------------------------------------------------------------------------------------------ No results found for: BNP  Inpatient Medications  Scheduled Meds: . albuterol  3 mL Inhalation QID  . mometasone-formoterol  2 puff Inhalation BID  . pantoprazole  40 mg Oral BID AC   Continuous Infusions: . sodium chloride 100 mL/hr at 04/02/17 1737  . sodium chloride    . sodium chloride     PRN Meds:.acetaminophen **OR** acetaminophen, LORazepam, ondansetron **OR** ondansetron (ZOFRAN) IV  Micro Results Recent Results (from the past 240 hour(s))  MRSA PCR Screening     Status: None   Collection Time: 04/01/17  8:37 PM  Result Value Ref Range Status   MRSA by PCR NEGATIVE NEGATIVE Final    Comment:        The GeneXpert MRSA Assay (FDA approved for NASAL specimens only), is one component of a comprehensive MRSA colonization surveillance program. It is not intended to diagnose MRSA infection nor to guide or monitor treatment for MRSA infections. Performed at [facility], [address] [place], [place] [address]     Radiology Reports Dg Ankle Complete Left  Result Date: 04/01/2017 CLINICAL DATA:  Pain EXAM: LEFT ANKLE COMPLETE - 3+ VIEW COMPARISON:  None. FINDINGS: There is no evidence of fracture, dislocation, or joint effusion. There is no evidence of arthropathy or other focal bone abnormality. Soft tissues are unremarkable. IMPRESSION: Negative. Electronically Signed   By: Franchot Gallo M.D.   On: 04/01/2017 12:25   Dg Foot Complete Left  Result Date: 04/01/2017 CLINICAL DATA:  Pain EXAM: LEFT FOOT - COMPLETE 3+ VIEW COMPARISON:  07/12/2013 FINDINGS: There is no evidence of fracture or dislocation. There is no evidence of arthropathy or other focal bone abnormality. Soft tissues are unremarkable. IMPRESSION: Negative. Electronically Signed   By: Franchot Gallo M.D.   On: 04/01/2017 12:26   Ct Angio Abd/pel W/ And/or W/o  Result Date: 04/02/2017 CLINICAL DATA:  72 year old female with drop in hemoglobin. EXAM: CTA ABDOMEN AND PELVIS wITHOUT AND WITH CONTRAST TECHNIQUE: Multidetector  CT imaging of the abdomen and pelvis was performed using the standard protocol during bolus administration of intravenous contrast. Multiplanar reconstructed images and MIPs were obtained and reviewed to evaluate the vascular anatomy. CONTRAST:  17mL ISOVUE-370 IOPAMIDOL (ISOVUE-370) INJECTION 76% COMPARISON:  CT of the abdomen pelvis dated 02/21/2017 FINDINGS: Evaluation is limited due to diffuse subcutaneous edema as well as paucity of intra-abdominal fat and cachexia. VASCULAR Aorta: There is advanced atherosclerotic calcification of the abdominal aorta. A 4.6 cm infrarenal abdominal aortic aneurysm with peripheral mural thrombus similar to prior CT. No evidence of rupture. No contrast noted outside of the confines of the aorta. Celiac: There is atherosclerotic calcification of the celiac axis. There is a focal area of high-grade narrowing at the origin of the celiac artery with mild focal  post stenotic dilatation of the vessel up to 8 mm in diameter. The celiac axis remains patent. SMA: There is atherosclerotic calcification of the SMA. Focal high-grade narrowing of the proximal SMA with post stenotic dilatation of the vessel up to 11 mm in diameter. The SMA remains patent. Renals: There is atherosclerotic calcification of the renal ostia. 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 The renal arteries remain patent. IMA: Not visualized. Inflow: There is advanced atherosclerotic calcification of the iliac arteries. 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 The right common iliac artery, internal and external iliac artery branches remain patent. 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 There is reconstitution of the flow in the bifurcation of the left common iliac artery and the internal and external iliac arteries. Proximal Outflow: Advanced atherosclerotic calcification of the visualized proximal common femoral arteries. Veins: No obvious venous abnormality within the limitations of this arterial phase study. Review of the MIP images confirms the above findings. NON-VASCULAR No intra-abdominal free air.  Small free fluid within the pelvis. Lower chest: The visualized lung bases are clear. Hepatobiliary: The liver is unremarkable as visualized. The gallbladder is grossly unremarkable. Pancreas: Unremarkable. No pancreatic ductal dilatation or surrounding inflammatory changes. Spleen: Normal in size without focal abnormality. Adrenals/Urinary Tract: Mild left renal parenchyma atrophy and cortical thinning. There is no hydronephrosis or obstructing stone on either side. There is symmetric enhancement of kidneys. Small bilateral renal cysts as seen on the prior CT. The urinary bladder is distended and grossly unremarkable. Stomach/Bowel: There is moderate amount of dense stool throughout the colon. There is no bowel  dilatation or evidence of obstruction. Lymphatic: No definite enlarged lymph nodes. Reproductive: The uterus is grossly unremarkable. Other: Diffuse subcutaneous edema and cachexia. Musculoskeletal: Osteopenia with degenerative changes of the spine. Old healed bilateral pubic bone fractures. No acute osseous pathology. IMPRESSION: VASCULAR 1. Advanced atherosclerotic calcification of the abdominal aorta and mesenteric vasculature. There is a 4.6 cm partially thrombosed infra-abdominal aortic aneurysm similar to prior CT. No aneurysmal rupture. Recommend followup by abdomen and pelvis CTA in 6 months, and vascular surgery referral/consultation if not already obtained. This recommendation follows ACR consensus guidelines: White Paper of the ACR Incidental Findings Committee II on Vascular Findings. J Am Coll Radiol 2013; 10:789-794. 2. No evidence intraperitoneal hemorrhage. 3. 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 4. 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 NON-VASCULAR 1. No bowel obstruction. 2. Small free fluid within the pelvis of indeterminate etiology. Clinical correlation is recommended. 3. Diffuse edema, and anasarca, and cachexia. Electronically  Signed   By: Anner Crete M.D.   On: 04/02/2017 00:13    Time Spent in minutes  25   Taseen Marasigan M.D on 04/03/2017 at 11:10 AM  Between 7am to 7pm - Pager - [phone]  After 7pm go to [email or web address] - password [email or web address]  [facility] -  Office  [phone]

## 2017-04-03 NOTE — Progress Notes (Signed)
Patient ID: Jackie Walls, female   DOB: 29-Sep-1945, 72 y.o.   MRN: [id number]  Assessment/Plan: ADMITTED WITH UGIB DUE TO LARGE GASTRIC ULCER.   PLAN: 1. PROTONIX BID FOR 3 MOS THEN ONCE DAILY. 2. AWAIT PATHOLOGY REPORT. 3. REPEAT EGD IN 3 MOS. 4. CLOSELY MONITOR Hb AND FOR SIGNS OR SYMPTOMS OF PERFORATED GASTRIC ULCER. 5. HOLD LIPITOR. PT IS 87 LBS AND BMI 14.    Subjective: Since I last evaluated the patient SHE HAS NO QUESTIONS OR CONCERNS. SHE DENIES ABDOMINAL PAIN, NAUSEA OR VOMITING.   Objective: Vital signs in last 24 hours: Vitals:   04/02/17 2236 04/03/17 0539  BP: (!) 105/44 (!) 124/58  Pulse: 70 73  Resp: 18 18  Temp: 98.4 F (36.9 C) 99.4 F (37.4 C)  SpO2: 91% 99%   General appearance: alert, cooperative and no distress Resp: clear to auscultation bilaterally Cardio: regular rate and rhythm GI: soft, non-tender; bowel sounds normal;   Lab Results: K 3.2 Cr 0.40 Hb 9.3 PLT CT 446  Studies/Results: NONE   Medications: I have reviewed the patient's current medications.   LOS: 5 days

## 2017-04-04 ENCOUNTER — Other Ambulatory Visit (HOSPITAL_COMMUNITY): Payer: Medicare Other

## 2017-04-04 ENCOUNTER — Telehealth: Payer: Self-pay | Admitting: Gastroenterology

## 2017-04-04 DIAGNOSIS — I714 Abdominal aortic aneurysm, without rupture: Secondary | ICD-10-CM

## 2017-04-04 DIAGNOSIS — I723 Aneurysm of iliac artery: Secondary | ICD-10-CM

## 2017-04-04 LAB — BASIC METABOLIC PANEL
Anion gap: 9 (ref 5–15)
BUN: 6 mg/dL (ref 6–20)
CO2: 22 mmol/L (ref 22–32)
Calcium: 8.5 mg/dL — ABNORMAL LOW (ref 8.9–10.3)
Chloride: 100 mmol/L — ABNORMAL LOW (ref 101–111)
Creatinine, Ser: 0.35 mg/dL — ABNORMAL LOW (ref 0.44–1.00)
GFR calc Af Amer: >60 mL/min (ref >60)
GLUCOSE: 77 mg/dL (ref 65–99)
POTASSIUM: 3.7 mmol/L (ref 3.5–5.1)
Sodium: 131 mmol/L — ABNORMAL LOW (ref 135–145)

## 2017-04-04 LAB — TYPE AND SCREEN
ABO/RH(D): A POS
Antibody Screen: NEGATIVE
UNIT DIVISION: 0
UNIT DIVISION: 0
Unit division: 0

## 2017-04-04 LAB — CBC
HEMATOCRIT: 31.2 % — AB (ref 36.0–46.0)
Hemoglobin: 10.1 g/dL — ABNORMAL LOW (ref 12.0–15.0)
MCH: 26.8 pg (ref 26.0–34.0)
MCHC: 32.4 g/dL (ref 30.0–36.0)
MCV: 82.8 fL (ref 78.0–100.0)
Platelets: 451 10*3/uL — ABNORMAL HIGH (ref 150–400)
RBC: 3.77 MIL/uL — ABNORMAL LOW (ref 3.87–5.11)
RDW: 15.3 % (ref 11.5–15.5)
WBC: 8.4 10*3/uL (ref 4.0–10.5)

## 2017-04-04 LAB — BPAM RBC
BLOOD PRODUCT EXPIRATION DATE: 201904092359
BLOOD PRODUCT EXPIRATION DATE: 201904102359
Blood Product Expiration Date: 201904242359
ISSUE DATE / TIME: 201903291319
ISSUE DATE / TIME: 201903301115
ISSUE DATE / TIME: 201903301426
Unit Type and Rh: 6200
Unit Type and Rh: 6200
Unit Type and Rh: 6200

## 2017-04-04 LAB — GLIA (IGA/G) + TTG IGA
Antigliadin Abs, IgA: 3 units (ref 0–19)
GLIADIN IGG: 3 U (ref 0–19)

## 2017-04-04 MED ORDER — CLOPIDOGREL BISULFATE 75 MG PO TABS: 75.0000 mg | ORAL_TABLET | Freq: Every day | ORAL | Status: AC

## 2017-04-04 MED ORDER — PANTOPRAZOLE SODIUM 40 MG PO TBEC: 40.0000 mg | DELAYED_RELEASE_TABLET | Freq: Two times a day (BID) | ORAL | 2 refills | Status: DC

## 2017-04-04 NOTE — Telephone Encounter (Signed)
Patient is already scheduled for 6/4 hospital follow-up with Magda Paganini. Please go ahead and save a slot for surveillance EGD in June 2019 with Dr. Gala Romney (after outpatient visit).

## 2017-04-04 NOTE — Progress Notes (Addendum)
Patient ID: Jackie Walls, female   DOB: 28-May-1945, 72 y.o.   MRN: [id number]   Assessment/Plan: ADMITTED WITH MELENA/ANEMIA DUE TO LARGE GASTRIC ULCER. CLINICALLY IMPROVED. RN JULIE REPORTS BLACK STOOL APR 1. HB STABLE SINCE MAR 30.  PLAN: 1. AWAIT PATH. DISCUSSED WITH PT AND FAMILY. 2. BID PPI 3. OK TO RE-START PLAVIX IN DISCHARGE. THE BENEFITS LIKELY OUTWEIGH RISK OF BLEEDING. 4. PT AWARE TO AVOID ASPIRIN. THE RISK OF BLEEDING/PERFORATION OUTWEIGHS THE BENEFITS. 5. CBC IN 2 WEEK. 6. REPEAT EGD IN JUN 2019.   Subjective: Since I last evaluated the patient she has not had a BM. Nurse(NANCY) reports pt did not have a bm today.  Objective: Vital signs in last 24 hours: Vitals:   04/04/17 1219 04/04/17 1423  BP:  (!) 150/81  Pulse:  86  Resp:  18  Temp:  98.1 F (36.7 C)  SpO2: 97% 94%   General appearance: alert, cooperative and no distress Resp: clear to auscultation bilaterally Cardio: regular rate and rhythm GI: soft, non-tender; bowel sounds normal NEURO: NO NEW FOCAL DEFICITS, PT IS HARD OF HEARING  Lab Results:  Hb 10.1 PLT CT 451 K 3.7 CR 0.35 PATHPENDING  Studies/Results: No results found.  Medications: I have reviewed the patient's current medications.   LOS: 5 days   Barney Drain 06/14/2013, 2:23 PM

## 2017-04-04 NOTE — Addendum Note (Signed)
Addended by: Danie Binder on: 04/04/2017 04:04 PM   Modules accepted: Orders

## 2017-04-04 NOTE — Telephone Encounter (Addendum)
PT NEEDS CBC IN 2 WEEKS, ORDERING MD: ROURK.  1603: RN JULIE REPORTS BLACK STOOL APR 1. HB STABLE SINCE MAR 30. OK TO RE-START PLAVIX. DISCUSSED WITH BRIAN AT THE DRUG STORE.

## 2017-04-04 NOTE — Progress Notes (Signed)
Initial Nutrition Assessment  DOCUMENTATION CODES:   Severe malnutrition in context of chronic illness  INTERVENTION:  Patient is being discharged home today:  Recommend consume High Calorie/ High Protein-ONS TID until she is eating >50% of a generally healthy diet high in lean protein, fresh fruits and vegetables and whole grains.   Decrease high sodium high fat breakfast foods to once weekly   NUTRITION DIAGNOSIS:   Severe Malnutrition related to chronic illness, poor appetite as evidenced by per patient/family report, meal completion < 25%, severe muscle depletion, severe fat depletion.   GOAL:   Patient will meet greater than or equal to 90% of their needs  MONITOR:   Supplement acceptance, Skin, Labs, PO intake, Weight trends  REASON FOR ASSESSMENT:   Consult Assessment of nutrition requirement/status, Poor PO  ASSESSMENT:  Patient is a 72 yo female with diagnoses of COPD, Duodenal ulcer, abnormal weight loss (140 lb ~ 5 years ago) and acute blood loss anemia. The patient is also hard of hearing.  EKG obtained today. CT-abdomen and pelvis 3/29- advanced calcification of abdominal aorta and diffuse edema, and anasarca, and cachexia. 3/30 upper endoscopy -Dr. Oneida Alar   Patient receives assistance from son daily. The quality of her food intake is very poor. Her meal intake is usually breakfast of sausage or bacon biscuit and coffee and dinner is some type of fast food such as pizza, alternate breakfast item such as gravy biscuits at night from [employer].   Patient drinks [facility] or Coffee. She will take Boost (chocolate flavor) 2-3 daily which she recently started.   No daily vitamin supplements reported.  Patient has not been eating well -since admission on consuming 0-25% of meals. Able to feed herself. The son says they allow her to eat what she wants and if they encourage her to eat more healthy choices she just refuses.   Labs reviewed:  Sodium 131 -Hgb improved  from 7.0 on admission to 10.1 today.  Meds: . albuterol  3 mL Inhalation QID  . mometasone-formoterol  2 puff Inhalation BID  . pantoprazole  40 mg Oral BID AC    NUTRITION - FOCUSED PHYSICAL EXAM:    Most Recent Value  Orbital Region  Severe depletion  Upper Arm Region  Severe depletion  Thoracic and Lumbar Region  Severe depletion  Buccal Region  Severe depletion  Temple Region  Moderate depletion  Clavicle Bone Region  Severe depletion  Clavicle and Acromion Bone Region  Severe depletion  Scapular Bone Region  Severe depletion  Dorsal Hand  Moderate depletion  Patellar Region  Severe depletion  Anterior Thigh Region  Severe depletion  Posterior Calf Region  Moderate depletion  Edema (RD Assessment)  None  Hair  Reviewed  Eyes  Reviewed  Mouth  Reviewed  Skin  Reviewed  Nails  Reviewed      Diet Order:  DIET SOFT Room service appropriate? Yes; Fluid consistency: Thin  EDUCATION NEEDS:   Education needs have been addressed  Skin:  Skin Assessment: Reviewed RN Assessment(Dry)  Last BM:  3/29  Height:   Ht Readings from Last 1 Encounters:  04/01/17 5\' 5"  (1.651 m)    Weight:   Wt Readings from Last 1 Encounters:  04/01/17 87 lb 1.3 oz (39.5 kg)    Ideal Body Weight:  57 kg  BMI:  Body mass index is 14.49 kg/m.  Estimated Nutritional Needs:   Kcal:  1400- 1600 (35-40 kcal/kg) to promote gradual wt gain  Protein:  60-68 gr  protein (1.5-1.7 gr/kg)  Fluid:  >1300 ml daily   Colman Cater MS,RD,CSG,LDN Office: [phone] Pager: [phone]

## 2017-04-04 NOTE — Care Management Note (Signed)
Case Management Note  Patient Details  Name: Jackie Walls MRN: [id number] Date of Birth: [birth year]    Expected Discharge Date:  04/04/17               Expected Discharge Plan:  [place]  In-House Referral:     Discharge planning Services  CM Consult  Post Acute Care Choice:  Home Health Choice offered to:  Patient, Adult Children  DME Arranged:    DME Agency:     HH Arranged:  PT [place]:  [place]  Status of Service:  Completed, signed off  If discussed at [place] of Stay Meetings, dates discussed:    Additional Comments: Patient discharging home today. From home with son (24/7). Discussed Home health with son and daughter, they are agreeable to home health and would like [facility]. Patient has not been seen by PT, however family reports they would never agree to STR even if recommended. Son reports patient has all necessary DME needed at home. She has continuous oxygen with [employer]. She has tanks for transport home.   Travonta Gill, Chauncey Reading, RN 04/04/2017, 1:28 PM

## 2017-04-04 NOTE — Telephone Encounter (Signed)
Held spot on 06/21/17 at 8:30am.

## 2017-04-04 NOTE — Progress Notes (Signed)
HAVE CT ANGIO OF ABD IN 6 MONTHS.   REFERRAL TO VASCULAR SURGEON FOR INFRARENAL AAA

## 2017-04-04 NOTE — Discharge Instructions (Signed)
Gastrointestinal Bleeding °Gastrointestinal bleeding is bleeding somewhere along the path food travels through the body (digestive tract). This path is anywhere between the mouth and the opening of the butt (anus). You may have blood in your poop (stools) or have black poop. If you throw up (vomit), there may be blood in it. °This condition can be mild, serious, or even life-threatening. If you have a lot of bleeding, you may need to stay in the hospital. °Follow these instructions at home: °· Take over-the-counter and prescription medicines only as told by your doctor. °· Eat foods that have a lot of fiber in them. These foods include whole grains, fruits, and vegetables. You can also try eating 1-3 prunes each day. °· Drink enough fluid to keep your pee (urine) clear or pale yellow. °· Keep all follow-up visits as told by your doctor. This is important. °Contact a doctor if: °· Your symptoms do not get better. °Get help right away if: °· Your bleeding gets worse. °· You feel dizzy or you pass out (faint). °· You feel weak. °· You have very bad cramps in your back or belly (abdomen). °· You pass large clumps of blood (clots) in your poop. °· Your symptoms are getting worse. °This information is not intended to replace advice given to you by your health care provider. Make sure you discuss any questions you have with your health care provider. °Document Released: 09/30/2007 Document Revised: 05/29/2015 Document Reviewed: 06/10/2014 °Elsevier Interactive Patient Education © 2018 [employer] ° °

## 2017-04-04 NOTE — Progress Notes (Signed)
Pt states her son is home to help her but [facility] and difficult to understand if this is accurate.  Have asked for HHPT to follow her home, and will plan for progression of therapy there, as well as to outpatient if needed when HHPT finishes seeing her.  Son is familiar with pt and has been in the same household so will anticipate speaking with him if needed to discuss pt's care.     04/04/17 1100  PT Visit Information  Last PT Received On 04/04/17  Assistance Needed +1  History of Present Illness 72 yo female with onset of wgt loss and anasarca with GI bleed and recent c-diff was admitted.  Her symptoms with stool have persisted but has elevated hgb.  PMHx:  COPD, HTN PVD, abd a stenosis, adenoma, AAA,   Precautions  Precautions Fall (telemetry)  Restrictions  Weight Bearing Restrictions No  Home Living  Family/patient expects to be discharged to: Private residence  Living Arrangements Children  Available Help at Discharge Family;Available 24 hours/day  Type of Home House  Home Access Stairs to enter  Entrance Stairs-Number of Steps 2  Entrance Stairs-Rails Right;Left;Can reach both  Home Layout One level  Tax adviser - 2 wheels;Cane - single point  Prior Function  Level of Independence Independent with assistive device(s)  Comments has been weaker recently  Communication  Communication HOH (Very difficult to understand PT)  Pain Assessment  Pain Assessment No/denies pain  Cognition  Arousal/Alertness Awake/alert  Behavior During Therapy WFL for tasks assessed/performed  Overall Cognitive Status No family/caregiver present to determine baseline cognitive functioning  General Comments pt follows instructions but does not answer questions well, though may be related to hearing  Upper Extremity Assessment  Upper Extremity Assessment Generalized weakness  Lower Extremity Assessment  Lower Extremity Assessment Generalized weakness  Cervical /  Trunk Assessment  Cervical / Trunk Assessment Kyphotic  Bed Mobility  Overal bed mobility Needs Assistance  Bed Mobility Supine to Sit;Sit to Supine  Supine to sit Min guard;Min assist  Sit to supine Min guard  General bed mobility comments uses bedrail and elevated head of bed to sit up  Transfers  Overall transfer level Needs assistance  Equipment used Rolling walker (2 wheeled);1 person hand held assist  Transfers Sit to/from Stand  Sit to Stand Supervision;Min guard  General transfer comment pt can stand without help in BR but using rail, as well as bedside  Ambulation/Gait  Ambulation/Gait assistance Min guard  Ambulation Distance (Feet) 35 Feet  Assistive device Rolling walker (2 wheeled);1 person hand held assist  Gait Pattern/deviations Step-through pattern;Step-to pattern;Decreased stride length;Narrow base of support;Trunk flexed  General Gait Details very low energy and mildly SOB with gait but did have 93% O2 sat on O2 2L   Gait velocity reduced  Gait velocity interpretation Below normal speed for age/gender  Balance  Overall balance assessment Needs assistance  Sitting-balance support Feet supported  Sitting balance-Leahy Scale Fair  Standing balance support Bilateral upper extremity supported;During functional activity  Standing balance-Leahy Scale Fair  Standing balance comment less than fair dynamically but uses RW  General Comments  General comments (skin integrity, edema, etc.) pt tends to want to set walker aside and just hold furniture  Exercises  Exercises Other exercises (LE strength is 3+ to 4- x ankles 4+)  PT - End of Session  Equipment Utilized During Treatment Gait belt;Oxygen  Activity Tolerance Patient limited by fatigue;Treatment limited secondary to medical complications (Comment)  Patient left in bed;with call bell/phone within reach;with bed alarm set  Nurse Communication Mobility status  PT Assessment  PT Recommendation/Assessment Patient  needs continued PT services  PT Visit Diagnosis Unsteadiness on feet (R26.81);Muscle weakness (generalized) (M62.81);Difficulty in walking, not elsewhere classified (R26.2)  PT Problem List Decreased range of motion;Decreased strength;Decreased activity tolerance;Decreased balance;Decreased mobility;Decreased coordination;Decreased knowledge of use of DME;Decreased cognition;Decreased safety awareness;Cardiopulmonary status limiting activity  Barriers to Discharge Inaccessible home environment;Decreased caregiver support  Barriers to Discharge Comments home with family but not there all the time  PT Plan  PT Frequency (ACUTE ONLY) Min 3X/week  PT Treatment/Interventions (ACUTE ONLY) DME instruction;Gait training;Therapeutic exercise;Stair training;Functional mobility training;Therapeutic activities;Balance training;Neuromuscular re-education;Patient/family education  AM-PAC PT "6 Clicks" Daily Activity Outcome Measure  Difficulty turning over in bed (including adjusting bedclothes, sheets and blankets)? 3  Difficulty moving from lying on back to sitting on the side of the bed?  3  Difficulty sitting down on and standing up from a chair with arms (e.g., wheelchair, bedside commode, etc,.)? 1  Help needed moving to and from a bed to chair (including a wheelchair)? 3  Help needed walking in hospital room? 3  Help needed climbing 3-5 steps with a railing?  3  6 Click Score 16  Mobility G Code  CK  PT Recommendation  Follow Up Recommendations Home health PT;Supervision for mobility/OOB  PT equipment None recommended by PT  Individuals Consulted  Consulted and Agree with Results and Recommendations Patient  Acute Rehab PT Goals  Patient Stated Goal to walk again and feel better  PT Goal Formulation With patient  Time For Goal Achievement 04/18/17  Potential to Achieve Goals Good  PT Time Calculation  PT Start Time (ACUTE ONLY) 1040  PT Stop Time (ACUTE ONLY) 1104  PT Time Calculation (min)  (ACUTE ONLY) 24 min  PT G-Codes **NOT FOR INPATIENT CLASS**  Functional Assessment Tool Used AM-PAC 6 Clicks Basic Mobility  PT General Charges  $$ ACUTE PT VISIT 1 Visit  PT Evaluation  $PT Eval Moderate Complexity 1 Mod  PT Treatments  $Gait Training 8-22 mins  Written Expression  Dominant Hand Right    4:46 PM, 04/04/17 Mee Hives, PT, MS Physical Therapist - [place] [phone] (Office)

## 2017-04-04 NOTE — Discharge Summary (Addendum)
Physician Discharge Summary  Jackie Walls:[id number] DOB: 1945/05/21 DOA: 04/01/2017  PCP: Dione Housekeeper, MD  Admit date: 04/01/2017 Discharge date: 04/04/2017  Admitted From: Home Disposition: Home  Recommendations for Outpatient Follow-up:  #1 follow up with PCP in 1 week.  Monitor H&H closely as outpatient. #2  Aspirin discontinued indefinitely.  Plavix can be resumed if hemoglobin stable as outpatient and patient no longer having black stools. #3 Follow-up with GI in 2 months with repeat upper endoscopy in 3 months for surveillance. #4 Patient needs follow-up CT angiogram of the abdomen in 6 months and outpatient referral to vascular surgery for infrarenal abdominal aortic aneurysm.  Home Health: Equipment/Devices:  Discharge Condition: Fair CODE STATUS: Full code Diet recommendation: Regular    Discharge Diagnoses:  Principal Problem:   Upper GI bleeding  Active Problems:   Gastric ulcer   Hyponatremia   Protein-calorie malnutrition, severe (HCC)   Symptomatic anemia   COPD (chronic obstructive pulmonary disease) (HCC)   Aneurysm of iliac artery (HCC)   Acute blood loss anemia   Duodenal ulcer   Abnormal weight loss   Brief narrative/HPI 72 year old female with thoracic and abdominal aortic aneurysm, chronic diarrhea, COPD, hypertension, hyperlipidemia, iron deficiency anemia with recent hospitalization at [facility] for abdominal pain, weight loss and dark stool with findings of blood loss anemia requiring transfusion and transferred to [facility] where she underwent EGD showing nonbleeding ulcer over lesser curvature of stomach.  She was then recently seen in the GI clinic for dark watery stool with positive C. difficile toxin and prescribed 14-day course of vancomycin. Patient saw her PCP on the day prior to admission where labs were done showing drop in H&H from baseline.  Family informs that patient has had progressive weight loss and poor p.o.  intake with complaints of abdominal discomfort after meals. In the ED she had soft blood pressure with blood work showing hemoglobin to 7.3 from a baseline around 9 and sodium of 121.  Patient admitted to hospitalist service and given 1 unit PRBC.  Placed on Protonix drip and GI consulted.  Principal Problem:    Upper GI bleeding Acute on chronic blood loss anemia. -Hemoglobin improved to 10, received total 3 unit PRBC.     -EGD done showing one nonbleeding cratered gastric ulcer with no stigmata for bleeding on the greater curvature of stomach which was biopsied.  Also shows diffuse mild inflammation with congestion and erythema in the entire stomach.  Follow biopsy as outpatient.  GI recommends : avoiding aspirin indefinitely ,  consider Plavix when patient no longer having black stools.  PPI twice daily for 3 months and repeat EGD in 3 months for surveillance.   Active Problems:   Hyponatremia Likely hypovolemic and improving with IV fluids.    Hypokalemia Replenished     COPD (chronic obstructive pulmonary disease) (HCC) Stable.  Continue home inhalers.     Abnormal weight loss less/   protein calorie malnutrition, severe (BMI 14.4) Progressive weight loss over the past few years.  IgA normal.  TTG IgA pending.  CT angiogram of the abdomen and pelvis done as per GI recommendations to rule out mesenteric ischemia and shows advanced atherosclerotic calcification of the abdominal aorta and mesenteric vessels. Nutrition and PT consult prior to discharge.  Infrarenal abdominal aortic aneurysm  4.6 cm partially thrombosed infrarenal abdominal aortic aneurysm similar to prior CT.  No aneurysm rupture or hemorrhage.  Recommends follow-up CTA in 6 months and referral to vascular surgery.  Recent C. difficile enteritis Completed 2 weeks course of vancomycin.  No further diarrhea.  Essential hypertension Resume blood pressure medications upon discharge.  Carotid artery  disease Hx of CEA. D/c ASA, holding plavix. Discontinued lipitor given severely low BMI      Family Communication  : Discussed with son and daughter on 3/30  Disposition Plan  : Home  Consults  : GI  Procedures  : EGD/CT angiogram of the abdomen    Discharge Instructions   Allergies as of 04/04/2017      Reactions   Ibuprofen Other (See Comments)   History of gastric ulcers.      Medication List    STOP taking these medications   aspirin EC 81 MG tablet   atorvastatin 40 MG tablet Commonly known as:  LIPITOR   clopidogrel 75 MG tablet Commonly known as:  PLAVIX   vancomycin 125 MG capsule Commonly known as:  VANCOCIN     TAKE these medications   albuterol (2.5 MG/3ML) 0.083% nebulizer solution Commonly known as:  PROVENTIL Inhale 3 mLs into the lungs every 4 (four) hours.   albuterol-ipratropium 18-103 MCG/ACT inhaler Commonly known as:  COMBIVENT Inhale 1 puff into the lungs every 6 (six) hours as needed for wheezing or shortness of breath.   alendronate 70 MG tablet Commonly known as:  FOSAMAX Take 70 mg by mouth once a week. Take with a full glass of water on an empty stomach. Takes on Sat.   amLODipine 10 MG tablet Commonly known as:  NORVASC Take 1 tablet by mouth daily.   budesonide-formoterol 160-4.5 MCG/ACT inhaler Commonly known as:  SYMBICORT Inhale 2 puffs into the lungs 2 (two) times daily.   doxazosin 4 MG tablet Commonly known as:  CARDURA Take 4 mg by mouth every morning.   hydrochlorothiazide 12.5 MG capsule Commonly known as:  MICROZIDE Take 1 capsule by mouth daily.   LORazepam 0.5 MG tablet Commonly known as:  ATIVAN Take 1 tablet by mouth every 8 (eight) hours as needed.   losartan 50 MG tablet Commonly known as:  COZAAR Take 100 mg by mouth every morning.   pantoprazole 40 MG tablet Commonly known as:  PROTONIX Take 1 tablet (40 mg total) by mouth 2 (two) times daily.      Follow-up Information    Dione Housekeeper, MD. Schedule an appointment as soon as possible for a visit in 1 week(s).   Specialty:  Family Medicine Contact information: [address] [place] [address] [phone]          Allergies  Allergen Reactions  . Ibuprofen Other (See Comments)    History of gastric ulcers.       Procedures/Studies: Dg Ankle Complete Left  Result Date: 04/01/2017 CLINICAL DATA:  Pain EXAM: LEFT ANKLE COMPLETE - 3+ VIEW COMPARISON:  None. FINDINGS: There is no evidence of fracture, dislocation, or joint effusion. There is no evidence of arthropathy or other focal bone abnormality. Soft tissues are unremarkable. IMPRESSION: Negative. Electronically Signed   By: Franchot Gallo M.D.   On: 04/01/2017 12:25   Dg Foot Complete Left  Result Date: 04/01/2017 CLINICAL DATA:  Pain EXAM: LEFT FOOT - COMPLETE 3+ VIEW COMPARISON:  07/12/2013 FINDINGS: There is no evidence of fracture or dislocation. There is no evidence of arthropathy or other focal bone abnormality. Soft tissues are unremarkable. IMPRESSION: Negative. Electronically Signed   By: Franchot Gallo M.D.   On: 04/01/2017 12:26   Ct Angio Abd/pel W/ And/or W/o  Result Date: 04/02/2017 CLINICAL DATA:  72 year old female with drop in hemoglobin. EXAM: CTA ABDOMEN AND PELVIS wITHOUT AND WITH CONTRAST TECHNIQUE: Multidetector CT imaging of the abdomen and pelvis was performed using the standard protocol during bolus administration of intravenous contrast. Multiplanar reconstructed images and MIPs were obtained and reviewed to evaluate the vascular anatomy. CONTRAST:  166mL ISOVUE-370 IOPAMIDOL (ISOVUE-370) INJECTION 76% COMPARISON:  CT of the abdomen pelvis dated 02/21/2017 FINDINGS: Evaluation is limited due to diffuse subcutaneous edema as well as paucity of intra-abdominal fat and cachexia. VASCULAR Aorta: There is advanced atherosclerotic calcification of the abdominal aorta. A 4.6 cm infrarenal abdominal aortic aneurysm with peripheral mural  thrombus similar to prior CT. No evidence of rupture. No contrast noted outside of the confines of the aorta. Celiac: There is atherosclerotic calcification of the celiac axis. There is a focal area of high-grade narrowing at the origin of the celiac artery with mild focal post stenotic dilatation of the vessel up to 8 mm in diameter. The celiac axis remains patent. SMA: There is atherosclerotic calcification of the SMA. Focal high-grade narrowing of the proximal SMA with post stenotic dilatation of the vessel up to 11 mm in diameter. The SMA remains patent. Renals: There is atherosclerotic calcification of the renal ostia. 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 The renal arteries remain patent. IMA: Not visualized. Inflow: There is advanced atherosclerotic calcification of the iliac arteries. 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 The right common iliac artery, internal and external iliac artery branches remain patent. 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 There is reconstitution of the flow in the bifurcation of the left common iliac artery and the internal and external iliac arteries. Proximal Outflow: Advanced atherosclerotic calcification of the visualized proximal common femoral arteries. Veins: No obvious venous abnormality within the limitations of this arterial phase study. Review of the MIP images confirms the above findings. NON-VASCULAR No intra-abdominal free air.  Small free fluid within the pelvis. Lower chest: The visualized lung bases are clear. Hepatobiliary: The liver is unremarkable as visualized. The gallbladder is grossly unremarkable. Pancreas: Unremarkable. No pancreatic ductal dilatation or surrounding inflammatory changes. Spleen: Normal in size without focal abnormality. Adrenals/Urinary Tract: Mild left renal parenchyma atrophy and cortical thinning. There is no hydronephrosis or obstructing  stone on either side. There is symmetric enhancement of kidneys. Small bilateral renal cysts as seen on the prior CT. The urinary bladder is distended and grossly unremarkable. Stomach/Bowel: There is moderate amount of dense stool throughout the colon. There is no bowel dilatation or evidence of obstruction. Lymphatic: No definite enlarged lymph nodes. Reproductive: The uterus is grossly unremarkable. Other: Diffuse subcutaneous edema and cachexia. Musculoskeletal: Osteopenia with degenerative changes of the spine. Old healed bilateral pubic bone fractures. No acute osseous pathology. IMPRESSION: VASCULAR 1. Advanced atherosclerotic calcification of the abdominal aorta and mesenteric vasculature. There is a 4.6 cm partially thrombosed infra-abdominal aortic aneurysm similar to prior CT. No aneurysmal rupture. Recommend followup by abdomen and pelvis CTA in 6 months, and vascular surgery referral/consultation if not already obtained. This recommendation follows ACR consensus guidelines: White Paper of the ACR Incidental Findings Committee II on Vascular Findings. J Am Coll Radiol 2013; 10:789-794. 2. No evidence intraperitoneal hemorrhage. 3. 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 4. 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 NON-VASCULAR 1. No bowel  obstruction. 2. Small free fluid within the pelvis of indeterminate etiology. Clinical correlation is recommended. 3. Diffuse edema, and anasarca, and cachexia. Electronically Signed   By: Anner Crete M.D.   On: 04/02/2017 00:13       Subjective: Had a large bowel movement with black stools.  H&H remains stable.  Appetite improved.  Discharge Exam: Vitals:   04/04/17 0901 04/04/17 1219  BP:    Pulse:    Resp:    Temp:    SpO2: 95% 97%   Vitals:   04/04/17 0500 04/04/17 0859 04/04/17 0901 04/04/17 1219  BP: (!) 152/78     Pulse: 75     Resp: 18     Temp: 98.8  F (37.1 C)     TempSrc: Oral     SpO2: 97% (!) 89% 95% 97%  Weight:      Height:        General: Elderly female not in distress, hard of hearing HEENT: Temporal wasting, pallor present, moist mucosa, supple neck Chest: Clear bilaterally CVS: Normal S1 and S2, no murmurs GI: Soft, nondistended, nontender, bowel sounds present Musculoskeletal: Warm, no edema      The results of significant diagnostics from this hospitalization (including imaging, microbiology, ancillary and laboratory) are listed below for reference.     Microbiology: Recent Results (from the past 240 hour(s))  MRSA PCR Screening     Status: None   Collection Time: 04/01/17  8:37 PM  Result Value Ref Range Status   MRSA by PCR NEGATIVE NEGATIVE Final    Comment:        The GeneXpert MRSA Assay (FDA approved for NASAL specimens only), is one component of a comprehensive MRSA colonization surveillance program. It is not intended to diagnose MRSA infection nor to guide or monitor treatment for MRSA infections. Performed at [facility], [address], [place], [place] [address]      Labs: BNP (last 3 results) No results for input(s): BNP in the last 8760 hours. Basic Metabolic Panel: Recent Labs  Lab 04/01/17 1141 04/02/17 0536 04/03/17 0447 04/04/17 0622  NA 121* 127* 131* 131*  K 4.0 3.7 3.2* 3.7  CL 85* 95* 100* 100*  CO2 24 23 22 22   GLUCOSE 96 81 84 77  BUN 9 6 <5* 6  CREATININE 0.50 0.41* 0.40* 0.35*  CALCIUM 9.3 8.2* 8.2* 8.5*   Liver Function Tests: Recent Labs  Lab 04/01/17 1141  AST 16  ALT 19  ALKPHOS 75  BILITOT 0.3  PROT 6.7  ALBUMIN 2.8*   Recent Labs  Lab 04/01/17 1141  LIPASE 30   No results for input(s): AMMONIA in the last 168 hours. CBC: Recent Labs  Lab 04/01/17 1141  04/02/17 0536 04/02/17 1114 04/02/17 2006 04/03/17 0447 04/04/17 0622  WBC 7.2  --   --   --   --  10.0 8.4  NEUTROABS 6.1  --   --   --   --   --   --   HGB 7.3*   < > 7.1* 7.0*  9.4* 9.3* 10.1*  HCT 23.1*   < > 22.6* 21.7* 28.6* 29.2* 31.2*  MCV 79.9  --   --   --   --  81.6 82.8  PLT 561*  --   --   --   --  446* 451*   < > = values in this interval not displayed.   Cardiac Enzymes: Recent Labs  Lab 04/01/17 1141  TROPONINI <0.03  BNP: Invalid input(s): POCBNP CBG: No results for input(s): GLUCAP in the last 168 hours. D-Dimer No results for input(s): DDIMER in the last 72 hours. Hgb A1c No results for input(s): HGBA1C in the last 72 hours. Lipid Profile No results for input(s): CHOL, HDL, LDLCALC, TRIG, CHOLHDL, LDLDIRECT in the last 72 hours. Thyroid function studies No results for input(s): TSH, T4TOTAL, T3FREE, THYROIDAB in the last 72 hours.  Invalid input(s): FREET3 Anemia work up No results for input(s): VITAMINB12, FOLATE, FERRITIN, TIBC, IRON, RETICCTPCT in the last 72 hours. Urinalysis    Component Value Date/Time   COLORURINE YELLOW 04/01/2017 1141   APPEARANCEUR CLEAR 04/01/2017 1141   LABSPEC 1.006 04/01/2017 1141   PHURINE 7.0 04/01/2017 1141   GLUCOSEU NEGATIVE 04/01/2017 1141   HGBUR NEGATIVE 04/01/2017 1141   BILIRUBINUR NEGATIVE 04/01/2017 1141   KETONESUR NEGATIVE 04/01/2017 1141   PROTEINUR NEGATIVE 04/01/2017 1141   UROBILINOGEN 0.2 05/13/2013 1304   NITRITE NEGATIVE 04/01/2017 1141   LEUKOCYTESUR TRACE (A) 04/01/2017 1141   Sepsis Labs Invalid input(s): PROCALCITONIN,  WBC,  LACTICIDVEN Microbiology Recent Results (from the past 240 hour(s))  MRSA PCR Screening     Status: None   Collection Time: 04/01/17  8:37 PM  Result Value Ref Range Status   MRSA by PCR NEGATIVE NEGATIVE Final    Comment:        The GeneXpert MRSA Assay (FDA approved for NASAL specimens only), is one component of a comprehensive MRSA colonization surveillance program. It is not intended to diagnose MRSA infection nor to guide or monitor treatment for MRSA infections. Performed at [facility], [address], [place], [place]  [address]      Time coordinating discharge: Over 30 minutes  SIGNED:   Louellen Molder, MD  [facility] 04/04/2017, 12:29 PM Pager   If 7PM-7AM, please contact night-coverage [email or web address] Password [email or web address]

## 2017-04-04 NOTE — Progress Notes (Addendum)
Reviewed chart. Hemoglobin improving, 10.1 today. Will need PPI BID for 3 months then daily. Pathology pending. Outpatient appt 6/4 at 9:30 with Neil Crouch, PA. Surveillance EGD to be arranged at that appt. Anticipate discharge in near future. Will follow-up as outpatient.

## 2017-04-04 NOTE — Care Management Important Message (Signed)
Important Message  Patient Details  Name: Jackie Walls MRN: [id number] Date of Birth: [birth year]   Medicare Important Message Given:  Yes    [name], [place] 04/04/2017, 1:21 PM

## 2017-04-05 ENCOUNTER — Other Ambulatory Visit: Payer: Self-pay

## 2017-04-05 ENCOUNTER — Encounter (HOSPITAL_COMMUNITY): Payer: Self-pay | Admitting: Gastroenterology

## 2017-04-05 ENCOUNTER — Ambulatory Visit (HOSPITAL_COMMUNITY): Payer: Medicare Other

## 2017-04-05 DIAGNOSIS — K921 Melena: Secondary | ICD-10-CM

## 2017-04-05 NOTE — Telephone Encounter (Signed)
Spoke with pts son. Mailing lab orders.

## 2017-04-07 ENCOUNTER — Telehealth: Payer: Self-pay | Admitting: Gastroenterology

## 2017-04-07 NOTE — Telephone Encounter (Signed)
Please call pt. HER ULCER BIOPSIES shows ULCER DUE TO ASPIRIN USE.   PROTONIX BID. AVOID ASA INDEFINITELY. CONTINUE PLAVIX. CBC NEXT WEEK. SEE DR.ROURK IN 3 MOS FOR REPEAT EGD.

## 2017-04-08 ENCOUNTER — Other Ambulatory Visit: Payer: Self-pay

## 2017-04-08 NOTE — Telephone Encounter (Signed)
OV made and on recall

## 2017-04-08 NOTE — Telephone Encounter (Signed)
LMOM to all. ( Per Elmo Putt, she is mailing the lab order that will be due).

## 2017-04-12 NOTE — Telephone Encounter (Signed)
PT and her son Jackie Walls are aware of the results and plan. They are aware Alicia mailed CBC order to do this week. They will call if they do not receive it so she will go to the lab. Forwarding to [place] to make appt.

## 2017-06-07 ENCOUNTER — Telehealth: Payer: Self-pay | Admitting: Gastroenterology

## 2017-06-07 ENCOUNTER — Ambulatory Visit: Payer: Medicare Other | Admitting: Gastroenterology

## 2017-06-07 ENCOUNTER — Encounter: Payer: Self-pay | Admitting: Gastroenterology

## 2017-06-07 NOTE — Telephone Encounter (Signed)
PATIENT WAS A NO SHOW AND LETTER SENT  °

## 2017-07-17 ENCOUNTER — Emergency Department (HOSPITAL_COMMUNITY): Payer: Medicare Other

## 2017-07-17 ENCOUNTER — Other Ambulatory Visit: Payer: Self-pay

## 2017-07-17 ENCOUNTER — Encounter (HOSPITAL_COMMUNITY): Payer: Self-pay

## 2017-07-17 ENCOUNTER — Inpatient Hospital Stay (HOSPITAL_COMMUNITY)
Admission: EM | Admit: 2017-07-17 | Discharge: 2017-07-19 | DRG: 377 | Disposition: A | Discharge status: Home / Self Care | Payer: Medicare Other | Attending: Internal Medicine | Admitting: Internal Medicine

## 2017-07-17 DIAGNOSIS — F419 Anxiety disorder, unspecified: Secondary | ICD-10-CM | POA: Diagnosis present

## 2017-07-17 DIAGNOSIS — E875 Hyperkalemia: Secondary | ICD-10-CM | POA: Diagnosis not present

## 2017-07-17 DIAGNOSIS — Z720 Tobacco use: Secondary | ICD-10-CM

## 2017-07-17 DIAGNOSIS — K921 Melena: Secondary | ICD-10-CM | POA: Diagnosis not present

## 2017-07-17 DIAGNOSIS — Z86010 Personal history of colonic polyps: Secondary | ICD-10-CM | POA: Diagnosis not present

## 2017-07-17 DIAGNOSIS — Z886 Allergy status to analgesic agent status: Secondary | ICD-10-CM

## 2017-07-17 DIAGNOSIS — Z7902 Long term (current) use of antithrombotics/antiplatelets: Secondary | ICD-10-CM

## 2017-07-17 DIAGNOSIS — Z716 Tobacco abuse counseling: Secondary | ICD-10-CM

## 2017-07-17 DIAGNOSIS — Z681 Body mass index (BMI) 19 or less, adult: Secondary | ICD-10-CM

## 2017-07-17 DIAGNOSIS — F329 Major depressive disorder, single episode, unspecified: Secondary | ICD-10-CM | POA: Diagnosis present

## 2017-07-17 DIAGNOSIS — I700 Atherosclerosis of aorta: Secondary | ICD-10-CM | POA: Diagnosis present

## 2017-07-17 DIAGNOSIS — H9190 Unspecified hearing loss, unspecified ear: Secondary | ICD-10-CM | POA: Diagnosis present

## 2017-07-17 DIAGNOSIS — K922 Gastrointestinal hemorrhage, unspecified: Secondary | ICD-10-CM | POA: Diagnosis present

## 2017-07-17 DIAGNOSIS — Z7982 Long term (current) use of aspirin: Secondary | ICD-10-CM | POA: Diagnosis not present

## 2017-07-17 DIAGNOSIS — E785 Hyperlipidemia, unspecified: Secondary | ICD-10-CM | POA: Diagnosis present

## 2017-07-17 DIAGNOSIS — D62 Acute posthemorrhagic anemia: Secondary | ICD-10-CM | POA: Diagnosis present

## 2017-07-17 DIAGNOSIS — I712 Thoracic aortic aneurysm, without rupture: Secondary | ICD-10-CM | POA: Diagnosis present

## 2017-07-17 DIAGNOSIS — J309 Allergic rhinitis, unspecified: Secondary | ICD-10-CM | POA: Diagnosis present

## 2017-07-17 DIAGNOSIS — F17210 Nicotine dependence, cigarettes, uncomplicated: Secondary | ICD-10-CM | POA: Diagnosis present

## 2017-07-17 DIAGNOSIS — J9611 Chronic respiratory failure with hypoxia: Secondary | ICD-10-CM | POA: Diagnosis present

## 2017-07-17 DIAGNOSIS — I959 Hypotension, unspecified: Secondary | ICD-10-CM | POA: Diagnosis present

## 2017-07-17 DIAGNOSIS — D649 Anemia, unspecified: Secondary | ICD-10-CM

## 2017-07-17 DIAGNOSIS — Z7951 Long term (current) use of inhaled steroids: Secondary | ICD-10-CM | POA: Diagnosis not present

## 2017-07-17 DIAGNOSIS — K254 Chronic or unspecified gastric ulcer with hemorrhage: Principal | ICD-10-CM | POA: Diagnosis present

## 2017-07-17 DIAGNOSIS — I714 Abdominal aortic aneurysm, without rupture, unspecified: Secondary | ICD-10-CM | POA: Diagnosis present

## 2017-07-17 DIAGNOSIS — K3182 Dieulafoy lesion (hemorrhagic) of stomach and duodenum: Secondary | ICD-10-CM | POA: Diagnosis present

## 2017-07-17 DIAGNOSIS — Z79899 Other long term (current) drug therapy: Secondary | ICD-10-CM

## 2017-07-17 DIAGNOSIS — I10 Essential (primary) hypertension: Secondary | ICD-10-CM | POA: Diagnosis not present

## 2017-07-17 DIAGNOSIS — R64 Cachexia: Secondary | ICD-10-CM | POA: Diagnosis present

## 2017-07-17 DIAGNOSIS — J9610 Chronic respiratory failure, unspecified whether with hypoxia or hypercapnia: Secondary | ICD-10-CM

## 2017-07-17 DIAGNOSIS — E43 Unspecified severe protein-calorie malnutrition: Secondary | ICD-10-CM

## 2017-07-17 DIAGNOSIS — J449 Chronic obstructive pulmonary disease, unspecified: Secondary | ICD-10-CM | POA: Diagnosis present

## 2017-07-17 DIAGNOSIS — T39395A Adverse effect of other nonsteroidal anti-inflammatory drugs [NSAID], initial encounter: Secondary | ICD-10-CM | POA: Diagnosis present

## 2017-07-17 LAB — COMPREHENSIVE METABOLIC PANEL
ALT: 17 U/L (ref 0–44)
ANION GAP: 7 (ref 5–15)
AST: 17 U/L (ref 15–41)
Albumin: 3.1 g/dL — ABNORMAL LOW (ref 3.5–5.0)
Alkaline Phosphatase: 62 U/L (ref 38–126)
BUN: 70 mg/dL — ABNORMAL HIGH (ref 8–23)
CALCIUM: 9.2 mg/dL (ref 8.9–10.3)
CHLORIDE: 101 mmol/L (ref 98–111)
CO2: 25 mmol/L (ref 22–32)
Creatinine, Ser: 0.82 mg/dL (ref 0.44–1.00)
GFR calc non Af Amer: >60 mL/min (ref >60)
Glucose, Bld: 90 mg/dL (ref 70–99)
Potassium: 5.2 mmol/L — ABNORMAL HIGH (ref 3.5–5.1)
SODIUM: 133 mmol/L — AB (ref 135–145)
Total Bilirubin: 0.6 mg/dL (ref 0.3–1.2)
Total Protein: 6.2 g/dL — ABNORMAL LOW (ref 6.5–8.1)

## 2017-07-17 LAB — CBC WITH DIFFERENTIAL/PLATELET
Basophils Absolute: 0 10*3/uL (ref 0.0–0.1)
Basophils Relative: 1 %
EOS ABS: 0.2 10*3/uL (ref 0.0–0.7)
EOS PCT: 2 %
HCT: 22.2 % — ABNORMAL LOW (ref 36.0–46.0)
Hemoglobin: 7.2 g/dL — ABNORMAL LOW (ref 12.0–15.0)
Lymphocytes Relative: 19 %
Lymphs Abs: 1.5 10*3/uL (ref 0.7–4.0)
MCH: 30 pg (ref 26.0–34.0)
MCHC: 32.4 g/dL (ref 30.0–36.0)
MCV: 92.5 fL (ref 78.0–100.0)
MONO ABS: 0.3 10*3/uL (ref 0.1–1.0)
MONOS PCT: 4 %
Neutro Abs: 6 10*3/uL (ref 1.7–7.7)
Neutrophils Relative %: 74 %
PLATELETS: 305 10*3/uL (ref 150–400)
RBC: 2.4 MIL/uL — ABNORMAL LOW (ref 3.87–5.11)
RDW: 15.7 % — ABNORMAL HIGH (ref 11.5–15.5)
WBC: 8.1 10*3/uL (ref 4.0–10.5)

## 2017-07-17 LAB — IRON AND TIBC
Iron: 38 ug/dL (ref 28–170)
Saturation Ratios: 14 % (ref 10.4–31.8)
TIBC: 266 ug/dL (ref 250–450)
UIBC: 228 ug/dL

## 2017-07-17 LAB — RETICULOCYTES
RBC.: 2.4 MIL/uL — ABNORMAL LOW (ref 3.87–5.11)
RETIC CT PCT: 2.3 % (ref 0.4–3.1)
Retic Count, Absolute: 55.2 10*3/uL (ref 19.0–186.0)

## 2017-07-17 LAB — VITAMIN B12: Vitamin B-12: 1189 pg/mL — ABNORMAL HIGH (ref 180–914)

## 2017-07-17 LAB — APTT: aPTT: 31 seconds (ref 24–36)

## 2017-07-17 LAB — MRSA PCR SCREENING: MRSA BY PCR: NEGATIVE

## 2017-07-17 LAB — PROTIME-INR
INR: 1.05
PROTHROMBIN TIME: 13.6 s (ref 11.4–15.2)

## 2017-07-17 LAB — POC OCCULT BLOOD, ED: Fecal Occult Bld: POSITIVE — AB

## 2017-07-17 LAB — FERRITIN: Ferritin: 189 ng/mL (ref 11–307)

## 2017-07-17 LAB — PREPARE RBC (CROSSMATCH)

## 2017-07-17 MED ORDER — FAMOTIDINE IN NACL 20-0.9 MG/50ML-% IV SOLN
20.0000 mg | INTRAVENOUS | Status: AC
  Administered 2017-07-17: 20 mg via INTRAVENOUS
  Filled 2017-07-17: qty 50

## 2017-07-17 MED ORDER — LORAZEPAM 0.5 MG PO TABS: 0.5000 mg | ORAL_TABLET | Freq: Three times a day (TID) | ORAL | Status: DC | PRN

## 2017-07-17 MED ORDER — SODIUM CHLORIDE 0.9% IV SOLUTION
Freq: Once | INTRAVENOUS | Status: AC
  Administered 2017-07-17: 17:00:00 via INTRAVENOUS

## 2017-07-17 MED ORDER — DOXAZOSIN MESYLATE 2 MG PO TABS
4.0000 mg | ORAL_TABLET | Freq: Every morning | ORAL | Status: DC
  Administered 2017-07-19: 4 mg via ORAL
  Filled 2017-07-17 (×2): qty 2

## 2017-07-17 MED ORDER — FAMOTIDINE IN NACL 20-0.9 MG/50ML-% IV SOLN: 20.0000 mg | Freq: Two times a day (BID) | INTRAVENOUS | Status: DC

## 2017-07-17 MED ORDER — ACETAMINOPHEN 650 MG RE SUPP: 650.0000 mg | Freq: Four times a day (QID) | RECTAL | Status: DC | PRN

## 2017-07-17 MED ORDER — ONDANSETRON HCL 4 MG PO TABS: 4.0000 mg | ORAL_TABLET | Freq: Four times a day (QID) | ORAL | Status: DC | PRN

## 2017-07-17 MED ORDER — ALBUTEROL SULFATE (2.5 MG/3ML) 0.083% IN NEBU
3.0000 mL | INHALATION_SOLUTION | RESPIRATORY_TRACT | Status: DC
  Administered 2017-07-17 – 2017-07-18 (×3): 3 mL via RESPIRATORY_TRACT
  Filled 2017-07-17 (×3): qty 3

## 2017-07-17 MED ORDER — MOMETASONE FURO-FORMOTEROL FUM 200-5 MCG/ACT IN AERO
2.0000 | INHALATION_SPRAY | Freq: Two times a day (BID) | RESPIRATORY_TRACT | Status: DC
  Administered 2017-07-17 – 2017-07-19 (×4): 2 via RESPIRATORY_TRACT
  Filled 2017-07-17 (×2): qty 8.8

## 2017-07-17 MED ORDER — PANTOPRAZOLE SODIUM 40 MG PO TBEC
40.0000 mg | DELAYED_RELEASE_TABLET | Freq: Two times a day (BID) | ORAL | Status: DC
  Administered 2017-07-18 – 2017-07-19 (×3): 40 mg via ORAL
  Filled 2017-07-17 (×3): qty 1

## 2017-07-17 MED ORDER — ACETAMINOPHEN 325 MG PO TABS: 650.0000 mg | ORAL_TABLET | Freq: Four times a day (QID) | ORAL | Status: DC | PRN

## 2017-07-17 MED ORDER — MIRTAZAPINE 15 MG PO TABS
15.0000 mg | ORAL_TABLET | Freq: Every day | ORAL | Status: DC
  Administered 2017-07-17 – 2017-07-18 (×2): 15 mg via ORAL
  Filled 2017-07-17: qty 1

## 2017-07-17 MED ORDER — ALBUTEROL SULFATE (2.5 MG/3ML) 0.083% IN NEBU
5.0000 mg | INHALATION_SOLUTION | Freq: Once | RESPIRATORY_TRACT | Status: AC
  Administered 2017-07-17: 5 mg via RESPIRATORY_TRACT
  Filled 2017-07-17: qty 6

## 2017-07-17 MED ORDER — ONDANSETRON HCL 4 MG/2ML IJ SOLN
4.0000 mg | Freq: Four times a day (QID) | INTRAMUSCULAR | Status: DC | PRN
Start: 2017-07-17 — End: 2017-07-19

## 2017-07-17 MED ORDER — HYDROCHLOROTHIAZIDE 12.5 MG PO CAPS: 12.5000 mg | ORAL_CAPSULE | Freq: Every day | ORAL | Status: DC

## 2017-07-17 MED ORDER — SODIUM CHLORIDE 0.9 % IV BOLUS
1000.0000 mL | Freq: Once | INTRAVENOUS | Status: AC
  Administered 2017-07-17: 1000 mL via INTRAVENOUS

## 2017-07-17 MED ORDER — ALBUTEROL SULFATE (2.5 MG/3ML) 0.083% IN NEBU: 3.0000 mL | INHALATION_SOLUTION | RESPIRATORY_TRACT | Status: DC

## 2017-07-17 MED ORDER — ATORVASTATIN CALCIUM 40 MG PO TABS
40.0000 mg | ORAL_TABLET | Freq: Every day | ORAL | Status: DC
Start: 2017-07-18 — End: 2017-07-18

## 2017-07-17 NOTE — ED Provider Notes (Signed)
[facility] EMERGENCY DEPARTMENT Provider Note   CSN: [id number] Arrival date & time: 07/17/17  1332     History   Chief Complaint Chief Complaint  Patient presents with  . Abdominal Pain  . GI Bleeding    HPI Jackie Walls is a 72 y.o. female.  HPI  72 year old female, multiple comorbid conditions including an ascending thoracic aortic aneurysm (nonsurgical candidate), history of COPD on chronic therapy, history of infrarenal abdominal aortic aneurysm which was found earlier this year during a work-up for GI bleed.  The patient has a history of admission to the hospital in March 2019 because of blood loss anemia, this was found to be related to upper GI bleeding.  She was known to have a prior perforated gastric ulcer because of NSAIDs in 2015 which did require a laparotomy with gastrorrhaphy.  She had an EGD in May 2015 which showed duodenal ulcer bleeding.  She was seen in the emergency department earlier this year at an outside hospital where a CT scan of the chest abdomen and pelvis showed the 4.6 cm ascending thoracic aneurysm as well as a abdominal aorta 4.7 cm in size which had grown from the size of 4 cm back in 2015.  She was also found to have a hemoglobin of 6, platelets of 603,000 and an MCV of 86.  In February she had EGD showing a large 4 cm deep cratered ulcer on the lesser curvature of the stomach but no active bleeding.  H. pylori was negative  The patient had been on Plavix daily and aspirin 325 mg daily, her Plavix was held and the aspirin was changed to 81 mg because of the peptic ulcer disease.  She did require multiple units of transfusion in March.  According to the daughter who is the primary historian secondary to the patient's significant hearing loss she has had some recurrent dark black stools which been going on for some time.  It is unclear exactly when they recurred, however she has been complaining of more more black stools and her son who she lives  with noted that the stools were extremely black today.  They have also been liquidy and diarrhea like.  Her appetite is gradually been decreasing and it seems like she may be losing weight as well.  The patient denies abdominal pain fevers chills nausea or vomiting but states that she does have decreased appetite and constant ongoing diarrhea.  She does not go to her family doctor very often, most recently seen for COPD in April 2019.  The patient did not present for her office visit with GI in June 4.    Past Medical History:  Diagnosis Date  . Abdominal aortic aneurysm ([place])   . ALLERGIC RHINITIS   . Anxiety   . Bronchitis   . Chronic diarrhea   . COPD (chronic obstructive pulmonary disease) ([place])   . Duodenal ulcer   . Hyperlipidemia   . Hypertension   . IDA (iron deficiency anemia)   . Multiple gastric ulcers   . Thoracic aortic aneurysm [facility])     Patient Active Problem List   Diagnosis Date Noted  . Blood loss anemia 04/01/2017  . Hyponatremia 04/01/2017  . Hypotension due to drugs   . Gastric ulcer 03/14/2017  . Abnormal weight loss 03/14/2017  . Abdominal pain 03/14/2017  . Diarrhea 11/08/2014  . Microscopic colitis 11/08/2014  . Loose stools 07/29/2013  . Duodenal ulcer 07/29/2013  . Bleeding duodenal ulcer 05/15/2013  . Gastritis  05/15/2013  . Acute blood loss anemia 05/14/2013  . Symptomatic anemia 05/13/2013  . Heme positive stool 05/13/2013  . Chronic respiratory failure ([place]) 05/13/2013  . Upper GI bleeding 05/13/2013  . Intra-abdominal free air of unknown etiology 04/25/2013  . Bowel perforation ([place]) 04/25/2013  . Protein-calorie malnutrition, severe ([place]) 04/25/2013  . Abdominal aneurysm without mention of rupture 11/14/2012  . 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 CAP (community acquired pneumonia) 01/29/2012  . COPD (chronic obstructive pulmonary disease) ([place]) 01/29/2012  . HTN (hypertension) 01/29/2012  . Bronchitis   . Hyperlipidemia      Past Surgical History:  Procedure Laterality Date  . BIOPSY  04/02/2017   Procedure: BIOPSY;  Surgeon: Danie Binder, MD;  Location: [facility] ENDO SUITE;  Service: Endoscopy;;  gastric and gastric ulcer  . CAROTID ENDARTERECTOMY Right   . CESAREAN SECTION    . COLONOSCOPY N/A 08/13/2013   FXT:[id number] polyps-removed as described above. Subtlyabnormal colonic mucosa-status post segmental biopsy.  EGD not  . ESOPHAGOGASTRODUODENOSCOPY N/A 05/15/2013   Dr. Oneida Alar: moderate erosive gastritis, duodenal ulcers, visible vessel with active oozing s/p bleeding control therapy, NSAID induced gastritis  . ESOPHAGOGASTRODUODENOSCOPY N/A 08/13/2013   RMR: EGD not needed and not done  . ESOPHAGOGASTRODUODENOSCOPY  02/23/2017   [facility]: arge 4 cm, deep, cratered ulcer along the lesser curvature of the stomach but no signs of active bleeding. H. pylori stool antigen is negative.   . ESOPHAGOGASTRODUODENOSCOPY N/A 04/02/2017   Procedure: ESOPHAGOGASTRODUODENOSCOPY (EGD);  Surgeon: Danie Binder, MD;  Location: [facility] ENDO SUITE;  Service: Endoscopy;  Laterality: N/A;  . GASTRORRHAPHY N/A 04/25/2013   Procedure: GASTRORRHAPHY;  Surgeon: Jamesetta So, MD;  Location: [facility] ORS;  Service: General;  Laterality: N/A;  . LAPAROTOMY N/A 04/25/2013   Procedure: EXPLORATORY LAPAROTOMY;  Surgeon: Jamesetta So, MD;  Location: [facility] ORS;  Service: General;  Laterality: N/A;     OB History   None      Home Medications    Prior to Admission medications   Medication Sig Start Date End Date Taking? Authorizing Provider  albuterol (PROVENTIL) (2.5 MG/3ML) 0.083% nebulizer solution Inhale 3 mLs into the lungs every 4 (four) hours. 02/28/17  Yes [provider]  alendronate (FOSAMAX) 70 MG tablet Take 70 mg by mouth once a week. Take with a full glass of water on an empty stomach. Takes on Sat.   Yes [provider]  amLODipine (NORVASC) 10 MG tablet Take 1 tablet by mouth daily. 02/28/17  Yes [provider]  atorvastatin (LIPITOR) 40 MG tablet Take 1 tablet by mouth daily. 06/06/17  Yes [provider]  budesonide-formoterol (SYMBICORT) 160-4.5 MCG/ACT inhaler Inhale 2 puffs into the lungs 2 (two) times daily.     Yes [provider]  clopidogrel (PLAVIX) 75 MG tablet Take 1 tablet (75 mg total) by mouth daily. 04/04/17  Yes Fields, Sandi L, MD  doxazosin (CARDURA) 4 MG tablet Take 4 mg by mouth every morning.    Yes [provider]  hydrochlorothiazide (MICROZIDE) 12.5 MG capsule Take 1 capsule by mouth daily. 02/28/17  Yes [provider]  LORazepam (ATIVAN) 0.5 MG tablet Take 1 tablet by mouth every 8 (eight) hours as needed for anxiety.  02/28/17  Yes [provider]  losartan (COZAAR) 50 MG tablet Take 100 mg by mouth every morning.    Yes [provider]  mirtazapine (REMERON) 15 MG tablet Take 1 tablet by mouth at bedtime. 06/06/17  Yes [provider]  pantoprazole (PROTONIX) 40 MG tablet Take 1 tablet (40 mg total) by mouth 2 (two) times daily. 04/04/17  Yes Dhungel, Flonnie Overman, MD    Family History Family History  Problem Relation Age of Onset  . Cancer Sister   . Colon cancer Neg Hx     Social History Social History   Tobacco Use  . Smoking status: Current Every Day Smoker    Packs/day: 0.25    Types: Cigarettes    Start date: 01/05/1948  . Smokeless tobacco: Never Used  Substance Use Topics  . Alcohol use: No  . Drug use: No     Allergies   Ibuprofen   Review of Systems Review of Systems  All other systems reviewed and are negative.    Physical Exam Updated Vital Signs BP 101/66   Pulse 84   Temp 98.2 F (36.8 C) (Oral)   Resp (!) 39   Ht 5\' 4"  (1.626 m)   Wt 37.2 kg (82 lb)   SpO2 100%   BMI 14.08 kg/m   Physical Exam  Constitutional: She appears well-developed and well-nourished. No distress.  Cachectic, minimally tachypneic, she is able to speak however she is severely hard of hearing.  HENT:   Head: Normocephalic and atraumatic.  Mouth/Throat: Oropharynx is clear and moist. No oropharyngeal exudate.  Eyes: Pupils are equal, round, and reactive to light. Conjunctivae and EOM are normal. Right eye exhibits no discharge. Left eye exhibits no discharge. No scleral icterus.  Neck: Normal range of motion. Neck supple. No JVD present. No thyromegaly present.  Cardiovascular: Normal rate, regular rhythm, normal heart sounds and intact distal pulses. Exam reveals no gallop and no friction rub.  No murmur heard. Pulmonary/Chest: No respiratory distress. She has wheezes. She has rales.  Tachypneic, prolonged expiratory phase, mild wheezing  Abdominal: Soft. Bowel sounds are normal. She exhibits mass. She exhibits no distension. There is no tenderness.  Pulsatile mass palpated in the mid to lower abdomen mostly on the left side  Genitourinary:  Genitourinary Comments: Chaperone present for rectal exam, normal-appearing anus, normal-appearing rectum, dark black grainy stool in the rectal vault  Musculoskeletal: Normal range of motion. She exhibits no edema or tenderness.  Lymphadenopathy:    She has no cervical adenopathy.  Neurological: She is alert. Coordination normal.  Skin: Skin is warm and dry. No rash noted. No erythema.  Psychiatric: She has a normal mood and affect. Her behavior is normal.  Nursing note and vitals reviewed.    ED Treatments / Results  Labs (all labs ordered are listed, but only abnormal results are displayed) Labs Reviewed  CBC WITH DIFFERENTIAL/PLATELET - Abnormal; Notable for the following components:      Result Value   RBC 2.40 (*)    Hemoglobin 7.2 (*)    HCT 22.2 (*)    RDW 15.7 (*)    All other components within normal limits  COMPREHENSIVE METABOLIC PANEL - Abnormal; Notable for the following components:   Sodium 133 (*)    Potassium 5.2 (*)    BUN 70 (*)    Total Protein 6.2 (*)    Albumin 3.1 (*)    All other components within normal limits   RETICULOCYTES - Abnormal; Notable for the following components:   RBC. 2.40 (*)    All other components within normal limits  POC OCCULT BLOOD, ED - Abnormal; Notable for the following components:   Fecal Occult Bld POSITIVE (*)    All other components within normal limits  APTT  PROTIME-INR  VITAMIN B12  FOLATE  IRON AND TIBC  FERRITIN  TYPE AND SCREEN  PREPARE RBC (CROSSMATCH)    EKG None  Radiology Dg Chest Port 1 View  Result Date: 07/17/2017 CLINICAL DATA:  72 year old female with shortness of breath and COPD EXAM: PORTABLE CHEST 1 VIEW COMPARISON:  Prior chest x-ray and CT scan of the chest 02/21/2017 FINDINGS: Stable cardiac and mediastinal contours. Atherosclerotic calcifications are present in the transverse aorta. Unchanged biapical pleuroparenchymal scarring. Background hyperinflation and bronchitic changes are similar compared to prior. No pulmonary edema, focal airspace consolidation, pleural effusion or pneumothorax. IMPRESSION: Stable chest x-ray without evidence of acute cardiopulmonary process. Electronically Signed   By: Jacqulynn Cadet M.D.   On: 07/17/2017 14:43    Procedures .Critical Care Performed by: Noemi Chapel, MD Authorized by: Noemi Chapel, MD   Critical care provider statement:    Critical care time (minutes):  35   Critical care time was exclusive of:  Separately billable procedures and treating other patients and teaching time   Critical care was necessary to treat or prevent imminent or life-threatening deterioration of the following conditions: severe anemia.   Critical care was time spent personally by me on the following activities:  Blood draw for specimens, development of treatment plan with patient or surrogate, discussions with consultants, evaluation of patient's response to treatment, examination of patient, obtaining history from patient or surrogate, ordering and performing treatments and interventions, ordering and review of  laboratory studies, ordering and review of radiographic studies, pulse oximetry, re-evaluation of patient's condition and review of old charts   (including critical care time)  Medications Ordered in ED Medications  0.9 %  sodium chloride infusion (Manually program via Guardrails IV Fluids) (has no administration in time range)  pantoprazole (PROTONIX) EC tablet 40 mg (has no administration in time range)  sodium chloride 0.9 % bolus 1,000 mL (1,000 mLs Intravenous New Bag/Given 07/17/17 1414)  famotidine (PEPCID) IVPB 20 mg premix (20 mg Intravenous New Bag/Given 07/17/17 1423)  albuterol (PROVENTIL) (2.5 MG/3ML) 0.083% nebulizer solution 5 mg (5 mg Nebulization Given 07/17/17 1513)     Initial Impression / Assessment and Plan / ED Course  I have reviewed the triage vital signs and the nursing notes.  Pertinent labs & imaging results that were available during my care of the patient were reviewed by me and considered in my medical decision making (see chart for details).  Clinical Course as of Jul 17 1520  Sun Jul 17, 2017  1507 BUN(!): 70 [BM]  1508 Hemoglobin(!): 7.2 [BM]  1508 Platelets: 305 [BM]  1508 Labs show that the patient has severe anemia hemoglobin of 7.2, BUN is over 70 but a normal creatinine also suggestive of upper GI bleed.  Sodium and potassium just outside the limits of normal, platelets normal, INR normal.  Blood pressure has improved just over 100, patient will need to be admitted and transfused.  Transfusion will be started in the emergency department.  Fecal Occult Blood, POC(!): POSITIVE [BM]    Clinical Course User Index [BM] Noemi Chapel, MD   The patient has an acute upper GI bleed, this is likely recurrent peptic ulcer disease.  I suspect that she is anemic again given how long this is been going on as well as her blood pressure which is in the 80s.  I am suspicious that the patient is severely anemic and will likely need to be transfused.  I have ordered a  type and screen, INR,  CBC, metabolic panel.  She will likely need to be admitted to the hospital.  Fluids of also been given for her hypotension.  She does appear critically ill.  With regards to her COPD I suspect he has chronic COPD but given her tachypnea and increased work of breathing she will also have a chest x-ray.  Care discussed with Dr. Gala Romney of the GI service will see the patient in consultation, care discussed with Dr. Sheran Lawless of the hospitalist service who has been kind enough to admit the patient and continue care.  The patient is actively being transfused.  She has had an IV fluid bolus.  She is critically ill with an upper GI bleed and severe anemia.  Unable to order Protonix due to national shortage, Pepcid given, oral Protonix will be given  Final Clinical Impressions(s) / ED Diagnoses   Final diagnoses:  Upper GI bleed  Severe anemia      Noemi Chapel, MD 07/17/17 [phone]

## 2017-07-17 NOTE — ED Triage Notes (Signed)
Daughter reports pt has been having black watery stool for "weeks" and abd pain.  Reports has had black stool since the beginning of the year.  Reports pt required a blood transfusion earlier in the year.  Pt very HOH.

## 2017-07-17 NOTE — H&P (Signed)
History and Physical  Jackie Walls MCN:[id number] DOB: 1945-02-21 DOA: 07/17/2017  Referring physician: Dr Sabra Heck, ED physician PCP: Dione Housekeeper, MD  Outpatient Specialists:   Patient Coming From: home  Chief Complaint: melena  HPI: Jackie Walls is a 72 y.o. female with a history of AAA, COPD, chronic respiratory, HTN, h/o GI bleed with gastric ulcer. She is seen for black, melenotic stools. Patient is not forth coming about when it started, only knows that she has had diarrhea for the past several days.  Patient uncertain as to whether her symptoms are worsening or improving.  Mild lightheadedness and dizziness.  Denies frank blood.  Denies abdominal pain.  No palliating or provoking factors.  Emergency Department Course: Hemoglobin 7.2.  BUN 70.  Creatinine 0.82.  Potassium 5.2.  INR 1.05.  Fecal occult blood positive.  Patient crossmatched 2 units  Review of Systems:   Pt denies any fevers, chills, nausea, vomiting, diarrhea, constipation, abdominal pain, shortness of breath, dyspnea on exertion, orthopnea, cough, wheezing, palpitations, headache, vision changes, rectal bleeding.  Review of systems are otherwise negative  Past Medical History:  Diagnosis Date  . Abdominal aortic aneurysm ([place])   . ALLERGIC RHINITIS   . Anxiety   . Bronchitis   . Chronic diarrhea   . COPD (chronic obstructive pulmonary disease) ([place])   . Duodenal ulcer   . Hyperlipidemia   . Hypertension   . IDA (iron deficiency anemia)   . Multiple gastric ulcers   . Thoracic aortic aneurysm [facility])    Past Surgical History:  Procedure Laterality Date  . BIOPSY  04/02/2017   Procedure: BIOPSY;  Surgeon: Danie Binder, MD;  Location[facility];  Service: Endoscopy;;  gastric and gastric ulcer  . CAROTID ENDARTERECTOMY Right   . CESAREAN SECTION    . COLONOSCOPY N/A 08/13/2013   [id number] polyps-removed as described above. Subtlyabnormal colonic mucosa-status post segmental biopsy.  EGD not   . ESOPHAGOGASTRODUODENOSCOPY N/A 05/15/2013   Dr. Oneida Alar: moderate erosive gastritis, duodenal ulcers, visible vessel with active oozing s/p bleeding control therapy, NSAID induced gastritis  . ESOPHAGOGASTRODUODENOSCOPY N/A 08/13/2013   RMR: EGD not needed and not done  . ESOPHAGOGASTRODUODENOSCOPY  02/23/2017   [facility]: arge 4 cm, deep, cratered ulcer along the lesser curvature of the stomach but no signs of active bleeding. H. pylori stool antigen is negative.   . ESOPHAGOGASTRODUODENOSCOPY N/A 04/02/2017   Procedure: ESOPHAGOGASTRODUODENOSCOPY (EGD);  Surgeon: Danie Binder, MD;  Location[facility];  Service: Endoscopy;  Laterality: N/A;  . GASTRORRHAPHY N/A 04/25/2013   Procedure: GASTRORRHAPHY;  Surgeon: Jamesetta So, MD;  Location: [facility] ORS;  Service: General;  Laterality: N/A;  . LAPAROTOMY N/A 04/25/2013   Procedure: EXPLORATORY LAPAROTOMY;  Surgeon: Jamesetta So, MD;  Location: [facility] ORS;  Service: General;  Laterality: N/A;   Social History:  reports that she has been smoking cigarettes.  She started smoking about 69 years ago. She has been smoking about 0.25 packs per day. She has never used smokeless tobacco. She reports that she does not drink alcohol or use drugs. Patient lives at home  Allergies  Allergen Reactions  . Ibuprofen Other (See Comments)    History of gastric ulcers.    Family History  Problem Relation Age of Onset  . Cancer Sister   . Colon cancer Neg Hx       Prior to Admission medications   Medication Sig Start Date End Date Taking? Authorizing Provider  albuterol (PROVENTIL) (2.5  MG/3ML) 0.083% nebulizer solution Inhale 3 mLs into the lungs every 4 (four) hours. 02/28/17  Yes [provider]  alendronate (FOSAMAX) 70 MG tablet Take 70 mg by mouth once a week. Take with a full glass of water on an empty stomach. Takes on Sat.   Yes [provider]  amLODipine (NORVASC) 10 MG tablet Take 1 tablet by mouth daily. 02/28/17  Yes [provider]  atorvastatin (LIPITOR) 40 MG tablet Take 1 tablet by mouth daily. 06/06/17  Yes [provider]  budesonide-formoterol (SYMBICORT) 160-4.5 MCG/ACT inhaler Inhale 2 puffs into the lungs 2 (two) times daily.     Yes [provider]  clopidogrel (PLAVIX) 75 MG tablet Take 1 tablet (75 mg total) by mouth daily. 04/04/17  Yes Fields, Sandi L, MD  doxazosin (CARDURA) 4 MG tablet Take 4 mg by mouth every morning.    Yes [provider]  hydrochlorothiazide (MICROZIDE) 12.5 MG capsule Take 1 capsule by mouth daily. 02/28/17  Yes [provider]  LORazepam (ATIVAN) 0.5 MG tablet Take 1 tablet by mouth every 8 (eight) hours as needed for anxiety.  02/28/17  Yes [provider]  losartan (COZAAR) 50 MG tablet Take 100 mg by mouth every morning.    Yes [provider]  mirtazapine (REMERON) 15 MG tablet Take 1 tablet by mouth at bedtime. 06/06/17  Yes [provider]  pantoprazole (PROTONIX) 40 MG tablet Take 1 tablet (40 mg total) by mouth 2 (two) times daily. 04/04/17  Yes Dhungel, Nishant, MD    Physical Exam: BP 106/70   Pulse 65   Temp 98.2 F (36.8 C) (Oral)   Resp 19   Ht 5\' 4"  (1.626 m)   Wt 37.2 kg (82 lb)   SpO2 (!) 83%   BMI 14.08 kg/m   . General: Elderly cachectic woman. Awake and alert and oriented x3. No acute cardiopulmonary distress.  Marland Kitchen HEENT: Normocephalic atraumatic.  Right and left ears normal in appearance.  Pupils equal, round, reactive to light. Extraocular muscles are intact. Sclerae anicteric and noninjected.  Conjunctiva pale.  Moist mucosal membranes. No mucosal lesions.  . Neck: Neck supple without lymphadenopathy. No carotid bruits. No masses palpated.  . Cardiovascular: Regular rate with normal S1-S2 sounds. No murmurs, rubs, gallops auscultated. No JVD.  Marland Kitchen Respiratory: Good respiratory effort with no wheezes, rales, rhonchi. Lungs clear to auscultation bilaterally.  No accessory muscle use. . Abdomen:  Soft, nontender, nondistended. Active bowel sounds. No masses or hepatosplenomegaly.  Abdominal murmur present.  Palpable aneurysm. . Skin: No rashes, lesions, or ulcerations.  Dry, warm to touch. 2+ dorsalis pedis and radial pulses. . Musculoskeletal: No calf or leg pain. All major joints not erythematous nontender.  No upper or lower joint deformation.  Good ROM.  No contractures  . Psychiatric: Intact judgment and insight. Pleasant and cooperative. . Neurologic: No focal neurological deficits. Strength is 5/5 and symmetric in upper and lower extremities.  Cranial nerves II through XII are grossly intact.           Labs on Admission: I have personally reviewed following labs and imaging studies  CBC: Recent Labs  Lab 07/17/17 1414  WBC 8.1  NEUTROABS 6.0  HGB 7.2*  HCT 22.2*  MCV 92.5  PLT 962   Basic Metabolic Panel: Recent Labs  Lab 07/17/17 1414  NA 133*  K 5.2*  CL 101  CO2 25  GLUCOSE 90  BUN 70*  CREATININE 0.82  CALCIUM 9.2  GFR: Estimated Creatinine Clearance: 37 mL/min (by C-G formula based on SCr of 0.82 mg/dL). Liver Function Tests: Recent Labs  Lab 07/17/17 1414  AST 17  ALT 17  ALKPHOS 62  BILITOT 0.6  PROT 6.2*  ALBUMIN 3.1*   No results for input(s): LIPASE, AMYLASE in the last 168 hours. No results for input(s): AMMONIA in the last 168 hours. Coagulation Profile: Recent Labs  Lab 07/17/17 1414  INR 1.05   Cardiac Enzymes: No results for input(s): CKTOTAL, CKMB, CKMBINDEX, TROPONINI in the last 168 hours. BNP (last 3 results) No results for input(s): PROBNP in the last 8760 hours. HbA1C: No results for input(s): HGBA1C in the last 72 hours. CBG: No results for input(s): GLUCAP in the last 168 hours. Lipid Profile: No results for input(s): CHOL, HDL, LDLCALC, TRIG, CHOLHDL, LDLDIRECT in the last 72 hours. Thyroid Function Tests: No results for input(s): TSH, T4TOTAL, FREET4, T3FREE, THYROIDAB in the last 72 hours. Anemia  Panel: Recent Labs    07/17/17 1414  RETICCTPCT 2.3   Urine analysis:    Component Value Date/Time   COLORURINE YELLOW 04/01/2017 1141   APPEARANCEUR CLEAR 04/01/2017 1141   LABSPEC 1.006 04/01/2017 1141   PHURINE 7.0 04/01/2017 1141   GLUCOSEU NEGATIVE 04/01/2017 1141   HGBUR NEGATIVE 04/01/2017 1141   BILIRUBINUR NEGATIVE 04/01/2017 1141   KETONESUR NEGATIVE 04/01/2017 1141   PROTEINUR NEGATIVE 04/01/2017 1141   UROBILINOGEN 0.2 05/13/2013 1304   NITRITE NEGATIVE 04/01/2017 1141   LEUKOCYTESUR TRACE (A) 04/01/2017 1141   Sepsis Labs: @LABRCNTIP (procalcitonin:4,lacticidven:4) )No results found for this or any previous visit (from the past 240 hour(s)).   Radiological Exams on Admission: Dg Chest Port 1 View  Result Date: 07/17/2017 CLINICAL DATA:  72 year old female with shortness of breath and COPD EXAM: PORTABLE CHEST 1 VIEW COMPARISON:  Prior chest x-ray and CT scan of the chest 02/21/2017 FINDINGS: Stable cardiac and mediastinal contours. Atherosclerotic calcifications are present in the transverse aorta. Unchanged biapical pleuroparenchymal scarring. Background hyperinflation and bronchitic changes are similar compared to prior. No pulmonary edema, focal airspace consolidation, pleural effusion or pneumothorax. IMPRESSION: Stable chest x-ray without evidence of acute cardiopulmonary process. Electronically Signed   By: Jacqulynn Cadet M.D.   On: 07/17/2017 14:43    EKG: Independently reviewed.  Sinus rhythm.  LVH.  No ST changes.  Assessment/Plan: Principal Problem:   Acute blood loss anemia Active Problems:   COPD (chronic obstructive pulmonary disease) (HCC)   HTN (hypertension)   Aneurysm of abdominal vessel (HCC)   Protein-calorie malnutrition, severe (HCC)   Chronic respiratory failure (HCC)   Upper GI bleeding   Hyperkalemia    This patient was discussed with the ED physician, including pertinent vitals, physical exam findings, labs, and imaging.  We also  discussed care given by the ED provider.  1. Acute blood loss anemia a. Admit to stepdown b. Patient receiving 2 units of blood c. Recheck hemoglobin 2. Upper GI bleed a. Patient already given Pepcid IV. b. Start Protonix 40 mg p.o. twice daily c. N.p.o. d. GI consulted 3. Hyperkalemia a. Secondary to GI bleed b. Recheck potassium tonight and tomorrow morning 4. Chronic respiratory failure a. Continue oxygen therapy 5. COPD a. Continue home inhalers 6. Hypertension a. Hold antihypertensives as patient is hypotensive 7. Aneurysm of abdominal vessels a. Stable 8. Severe protein calorie malnutrition  DVT prophylaxis: SCDs Consultants: GI Code Status: Full Family Communication: daughter and granddaughter present in room  Disposition Plan: home   Truett Mainland, DO [facility] Pager [phone]  If 7PM-7AM, please contact night-coverage [email or web address] Password [email or web address]

## 2017-07-18 ENCOUNTER — Encounter (HOSPITAL_COMMUNITY)
Admission: EM | Disposition: A | Discharge status: Home / Self Care | Payer: Self-pay | Source: Home / Self Care | Attending: Internal Medicine

## 2017-07-18 ENCOUNTER — Encounter (HOSPITAL_COMMUNITY): Payer: Self-pay | Admitting: Gastroenterology

## 2017-07-18 DIAGNOSIS — Z720 Tobacco use: Secondary | ICD-10-CM

## 2017-07-18 DIAGNOSIS — K921 Melena: Secondary | ICD-10-CM

## 2017-07-18 DIAGNOSIS — K3182 Dieulafoy lesion (hemorrhagic) of stomach and duodenum: Secondary | ICD-10-CM

## 2017-07-18 DIAGNOSIS — D62 Acute posthemorrhagic anemia: Secondary | ICD-10-CM

## 2017-07-18 DIAGNOSIS — F17210 Nicotine dependence, cigarettes, uncomplicated: Secondary | ICD-10-CM

## 2017-07-18 DIAGNOSIS — J9611 Chronic respiratory failure with hypoxia: Secondary | ICD-10-CM

## 2017-07-18 DIAGNOSIS — K254 Chronic or unspecified gastric ulcer with hemorrhage: Principal | ICD-10-CM

## 2017-07-18 DIAGNOSIS — K922 Gastrointestinal hemorrhage, unspecified: Secondary | ICD-10-CM

## 2017-07-18 HISTORY — PX: ESOPHAGOGASTRODUODENOSCOPY: SHX5428

## 2017-07-18 LAB — FOLATE: FOLATE: 68.4 ng/mL (ref >5.9)

## 2017-07-18 LAB — CBC
HEMATOCRIT: 26.4 % — AB (ref 36.0–46.0)
HEMOGLOBIN: 8.8 g/dL — AB (ref 12.0–15.0)
MCH: 30.2 pg (ref 26.0–34.0)
MCHC: 33.3 g/dL (ref 30.0–36.0)
MCV: 90.7 fL (ref 78.0–100.0)
Platelets: 238 10*3/uL (ref 150–400)
RBC: 2.91 MIL/uL — ABNORMAL LOW (ref 3.87–5.11)
RDW: 15.6 % — ABNORMAL HIGH (ref 11.5–15.5)
WBC: 8.8 10*3/uL (ref 4.0–10.5)

## 2017-07-18 LAB — BASIC METABOLIC PANEL
ANION GAP: 6 (ref 5–15)
Anion gap: 6 (ref 5–15)
BUN: 50 mg/dL — ABNORMAL HIGH (ref 8–23)
BUN: 49 mg/dL — AB (ref 8–23)
CALCIUM: 8.6 mg/dL — AB (ref 8.9–10.3)
CHLORIDE: 107 mmol/L (ref 98–111)
CO2: 24 mmol/L (ref 22–32)
CO2: 24 mmol/L (ref 22–32)
CREATININE: 0.68 mg/dL (ref 0.44–1.00)
Calcium: 8.6 mg/dL — ABNORMAL LOW (ref 8.9–10.3)
Chloride: 108 mmol/L (ref 98–111)
Creatinine, Ser: 0.56 mg/dL (ref 0.44–1.00)
GFR calc Af Amer: >60 mL/min (ref >60)
GFR calc non Af Amer: >60 mL/min (ref >60)
Glucose, Bld: 86 mg/dL (ref 70–99)
Glucose, Bld: 91 mg/dL (ref 70–99)
POTASSIUM: 3.8 mmol/L (ref 3.5–5.1)
Potassium: 3.8 mmol/L (ref 3.5–5.1)
Sodium: 137 mmol/L (ref 135–145)
Sodium: 138 mmol/L (ref 135–145)

## 2017-07-18 LAB — TYPE AND SCREEN
ABO/RH(D): A POS
Antibody Screen: NEGATIVE
Unit division: 0
Unit division: 0

## 2017-07-18 LAB — BPAM RBC
BLOOD PRODUCT EXPIRATION DATE: 201908082359
Blood Product Expiration Date: 201907262359
ISSUE DATE / TIME: 201907141611
ISSUE DATE / TIME: 201907141959
Unit Type and Rh: 6200
Unit Type and Rh: 6200

## 2017-07-18 SURGERY — EGD (ESOPHAGOGASTRODUODENOSCOPY)
Anesthesia: Moderate Sedation

## 2017-07-18 MED ORDER — MEPERIDINE HCL 100 MG/ML IJ SOLN
INTRAMUSCULAR | Status: DC | PRN
  Administered 2017-07-18 (×3): 25 mg via INTRAVENOUS

## 2017-07-18 MED ORDER — LIDOCAINE VISCOUS HCL 2 % MT SOLN
OROMUCOSAL | Status: DC | PRN
  Administered 2017-07-18: 1 via OROMUCOSAL

## 2017-07-18 MED ORDER — ALBUTEROL SULFATE (2.5 MG/3ML) 0.083% IN NEBU
3.0000 mL | INHALATION_SOLUTION | Freq: Four times a day (QID) | RESPIRATORY_TRACT | Status: DC
  Administered 2017-07-18 – 2017-07-19 (×6): 3 mL via RESPIRATORY_TRACT
  Filled 2017-07-18 (×6): qty 3

## 2017-07-18 MED ORDER — EPINEPHRINE PF 1 MG/10ML IJ SOSY
PREFILLED_SYRINGE | INTRAMUSCULAR | Status: AC
  Filled 2017-07-18: qty 10

## 2017-07-18 MED ORDER — SODIUM CHLORIDE 0.9 % IJ SOLN
PREFILLED_SYRINGE | INTRAMUSCULAR | Status: DC | PRN
  Administered 2017-07-18: 8 mL
  Administered 2017-07-18: 2 mL

## 2017-07-18 MED ORDER — LIDOCAINE VISCOUS HCL 2 % MT SOLN
OROMUCOSAL | Status: AC
  Filled 2017-07-18: qty 15

## 2017-07-18 MED ORDER — MIDAZOLAM HCL 5 MG/5ML IJ SOLN
INTRAMUSCULAR | Status: DC | PRN
  Administered 2017-07-18: 1 mg via INTRAVENOUS
  Administered 2017-07-18: 2 mg via INTRAVENOUS
  Administered 2017-07-18 (×2): 1 mg via INTRAVENOUS

## 2017-07-18 MED ORDER — NICOTINE 14 MG/24HR TD PT24
14.0000 mg | MEDICATED_PATCH | Freq: Every day | TRANSDERMAL | Status: DC
  Administered 2017-07-18: 14 mg via TRANSDERMAL
  Filled 2017-07-18 (×2): qty 1

## 2017-07-18 MED ORDER — MIDAZOLAM HCL 5 MG/5ML IJ SOLN
INTRAMUSCULAR | Status: AC
  Filled 2017-07-18: qty 10

## 2017-07-18 MED ORDER — MEPERIDINE HCL 100 MG/ML IJ SOLN
INTRAMUSCULAR | Status: AC
  Filled 2017-07-18: qty 2

## 2017-07-18 NOTE — Consult Note (Signed)
Referring Provider:  Dr. Nehemiah Settle Primary Care Physician:  Dione Housekeeper, MD Primary Gastroenterologist:  Dr. Gala Romney   Date of Admission: 07/17/17 Date of Consultation: 07/18/17  Reason for Consultation:  UGI bleed   HPI:  Jackie Walls is a 72 y.o. year old female with a history of perforated gastric ulcer secondary to NSAIDs in April 2015, s/p laparotomy. Last inpatient March 2019 with acute blood loss anemia, melena, undergoing EGD March 2019 with large non-bleeding gastric ulcer secondary to NSAIDs. She had been on aspirin and Plavix. Plavix recommended to resume once no further melena. History also notable for EGD in Feb 2019 at [facility], requiring admission. Was due for surveillance with our office but did not return to arrange. Presented this admission with melena and Hgb 7.2. Her last Hgb on file was 10.1 in April 2019. Received 2 units PRBCs, now with Hgb 8.8.   Daughter at bedside. States she has seen melena since going home in March 2019. Daughter states patient was not forthcoming with reports of melena. No abdominal pain, no N/V. Lives with son. Son works during the day, so she is by herself from [place] to 4pm. No dysphagia. Has been taking Aleve as she is out of pain medication from PCP. Daughter believes she has been taking Protonix BID. No further melena since admission.   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 4.6 cm partially thrombosed infra-abdominal aortic aneurysm similar to prior findings. Focal high-grade narrowing of proximal SMA with post stenotic dilatation up to 83mm diameter, remains patent. Focal area of high-grade narrowing at the origin of celiac artery with mild focal post stenotic dilatation of vessel up to 8 mm diameter but celiac axis remains patent. IMA not visualized.   In March 2019, weight was 83 lbs. Remains at this weight.      Past Medical History:  Diagnosis Date  . Abdominal  aortic aneurysm ([place])   . ALLERGIC RHINITIS   . Anxiety   . Bronchitis   . Chronic diarrhea   . COPD (chronic obstructive pulmonary disease) ([place])   . Duodenal ulcer   . Hyperlipidemia   . Hypertension   . IDA (iron deficiency anemia)   . Multiple gastric ulcers   . Thoracic aortic aneurysm [facility])     Past Surgical History:  Procedure Laterality Date  . BIOPSY  04/02/2017   Procedure: BIOPSY;  Surgeon: Danie Binder, MD;  [facility];  Service: Endoscopy;;  gastric and gastric ulcer  . CAROTID ENDARTERECTOMY Right   . CESAREAN SECTION    . COLONOSCOPY N/A 08/13/2013   [id number] polyps-removed as described above. Subtlyabnormal colonic mucosa-status post segmental biopsy.  EGD not  . ESOPHAGOGASTRODUODENOSCOPY N/A 05/15/2013   Dr. Oneida Alar: moderate erosive gastritis, duodenal ulcers, visible vessel with active oozing s/p bleeding control therapy, NSAID induced gastritis  . ESOPHAGOGASTRODUODENOSCOPY N/A 08/13/2013   RMR: EGD not needed and not done  . ESOPHAGOGASTRODUODENOSCOPY  02/23/2017   [facility]: arge 4 cm, deep, cratered ulcer along the lesser curvature of the stomach but no signs of active bleeding. H. pylori stool antigen is negative.   . ESOPHAGOGASTRODUODENOSCOPY N/A 04/02/2017   Dr. Oneida Alar: normal esophagus, large non-bleeding cratered gastric ulcer, normal duodenum, secondary to aspirin   . GASTRORRHAPHY N/A 04/25/2013   Procedure: GASTRORRHAPHY;  Surgeon: Jamesetta So, MD;  Location: [facility] ORS;  Service: General;  Laterality: N/A;  . LAPAROTOMY N/A 04/25/2013   Procedure: EXPLORATORY LAPAROTOMY;  Surgeon: Jamesetta So, MD;  Location: [facility] ORS;  Service: General;  Laterality: N/A;    Prior to Admission medications   Medication Sig Start Date End Date Taking? Authorizing Provider  albuterol (PROVENTIL) (2.5 MG/3ML) 0.083% nebulizer solution Inhale 3 mLs into the lungs every 4 (four) hours. 02/28/17  Yes [provider]  alendronate (FOSAMAX) 70 MG tablet Take  70 mg by mouth once a week. Take with a full glass of water on an empty stomach. Takes on Sat.   Yes [provider]  amLODipine (NORVASC) 10 MG tablet Take 1 tablet by mouth daily. 02/28/17  Yes [provider]  atorvastatin (LIPITOR) 40 MG tablet Take 1 tablet by mouth daily. 06/06/17  Yes [provider]  budesonide-formoterol (SYMBICORT) 160-4.5 MCG/ACT inhaler Inhale 2 puffs into the lungs 2 (two) times daily.     Yes [provider]  clopidogrel (PLAVIX) 75 MG tablet Take 1 tablet (75 mg total) by mouth daily. 04/04/17  Yes Fields, Sandi L, MD  doxazosin (CARDURA) 4 MG tablet Take 4 mg by mouth every morning.    Yes [provider]  hydrochlorothiazide (MICROZIDE) 12.5 MG capsule Take 1 capsule by mouth daily. 02/28/17  Yes [provider]  LORazepam (ATIVAN) 0.5 MG tablet Take 1 tablet by mouth every 8 (eight) hours as needed for anxiety.  02/28/17  Yes [provider]  losartan (COZAAR) 50 MG tablet Take 100 mg by mouth every morning.    Yes [provider]  mirtazapine (REMERON) 15 MG tablet Take 1 tablet by mouth at bedtime. 06/06/17  Yes [provider]  pantoprazole (PROTONIX) 40 MG tablet Take 1 tablet (40 mg total) by mouth 2 (two) times daily. 04/04/17  Yes Dhungel, Flonnie Overman, MD    Current Facility-Administered Medications  Medication Dose Route Frequency Provider Last Rate Last Dose  . acetaminophen (TYLENOL) tablet 650 mg  650 mg Oral Q6H PRN Truett Mainland, DO       Or  . acetaminophen (TYLENOL) suppository 650 mg  650 mg Rectal Q6H PRN Truett Mainland, DO      . albuterol (PROVENTIL) (2.5 MG/3ML) 0.083% nebulizer solution 3 mL  3 mL Inhalation Q6H Reubin Milan, MD   3 mL at 07/18/17 0752  . atorvastatin (LIPITOR) tablet 40 mg  40 mg Oral Daily Truett Mainland, DO      . doxazosin (CARDURA) tablet 4 mg  4 mg Oral q morning - 10a Truett Mainland, DO      . famotidine (PEPCID) IVPB 20 mg premix  20  mg Intravenous Q12H Stinson, Jacob J, DO      . hydrochlorothiazide (MICROZIDE) capsule 12.5 mg  12.5 mg Oral Daily Truett Mainland, DO      . LORazepam (ATIVAN) tablet 0.5 mg  0.5 mg Oral Q8H PRN Truett Mainland, DO      . mirtazapine (REMERON) tablet 15 mg  15 mg Oral QHS Truett Mainland, DO   15 mg at 07/17/17 2200  . mometasone-formoterol (DULERA) 200-5 MCG/ACT inhaler 2 puff  2 puff Inhalation BID Truett Mainland, DO   2 puff at 07/18/17 0757  . ondansetron (ZOFRAN) tablet 4 mg  4 mg Oral Q6H PRN Truett Mainland, DO       Or  . ondansetron [facility]) injection 4 mg  4 mg Intravenous Q6H PRN Truett Mainland, DO      . pantoprazole (PROTONIX) EC tablet 40 mg  40 mg Oral  BID AC Truett Mainland, DO        Allergies as of 07/17/2017 - Review Complete 07/17/2017  Allergen Reaction Noted  . Ibuprofen Other (See Comments) 05/15/2013    Family History  Problem Relation Age of Onset  . Cancer Sister   . Colon cancer Neg Hx     Social History   Socioeconomic History  . Marital status: Widowed    Spouse name: Not on file  . Number of children: Not on file  . Years of education: Not on file  . Highest education level: Not on file  Occupational History  . Not on file  Social Walls  . Financial resource strain: Not on file  . Food insecurity:    Worry: Not on file    Inability: Not on file  . Transportation Walls:    Medical: Not on file    Non-medical: Not on file  Tobacco Use  . Smoking status: Current Every Day Smoker    Packs/day: 0.25    Types: Cigarettes    Start date: 01/05/1948  . Smokeless tobacco: Never Used  Substance and Sexual Activity  . Alcohol use: No  . Drug use: No  . Sexual activity: Not on file  Lifestyle  . Physical activity:    Days per week: Not on file    Minutes per session: Not on file  . Stress: Not on file  Relationships  . Social connections:    Talks on phone: Not on file    Gets together: Not on file    Attends religious service: Not  on file    Active member of club or organization: Not on file    Attends meetings of clubs or organizations: Not on file    Relationship status: Not on file  . Intimate partner violence:    Fear of current or ex partner: Not on file    Emotionally abused: Not on file    Physically abused: Not on file    Forced sexual activity: Not on file  Other Topics Concern  . Not on file  Social History Narrative   Lives with her husband at home.    Review of Systems: Gen: Denies fever, chills, loss of appetite, change in weight or weight loss CV: Denies chest pain, heart palpitations, syncope, edema  Resp: +cough  GI: see HPI  GU : Denies urinary burning, urinary frequency, urinary incontinence.  MS: Denies joint pain,swelling, cramping Derm: Denies rash, itching, dry skin Psych: Denies depression, anxiety,confusion, or memory loss Heme: see HPI   Physical Exam: Vital signs in last 24 hours: Temp:  [97.6 F (36.4 C)-98.8 F (37.1 C)] 98.8 F (37.1 C) (07/15 0721) Pulse Rate:  [65-99] 79 (07/15 0721) Resp:  [14-39] 23 (07/15 0721) BP: (69-118)/(53-76) 97/71 (07/14 2230) SpO2:  [83 %-100 %] 97 % (07/15 0757) Weight:  [82 lb (37.2 kg)-83 lb 15.9 oz (38.1 kg)] 83 lb 15.9 oz (38.1 kg) (07/15 0500) Last BM Date: 07/17/17 General:   Alert,  Chronically-ill appearing, cachectic Head:  Normocephalic and atraumatic. Eyes:  Sclera clear, no icterus.   Conjunctiva pink. Ears:  Extremely hard of hearing  Nose:  No deformity, discharge,  or lesions. Mouth:  No deformity or lesions, dentition normal. Lungs:  Mild expiratory wheeze Heart:  S1 S2 present without obvious murmur  Abdomen:  Soft, nontender and nondistended. Notable abdominal bruit Rectal:  Deferred  Msk:  Symmetrical without gross deformities. Normal posture. Extremities:  Markedly thin upper and lower extremities, no  edema Neurologic:  Alert and  oriented x4 Psych:  Alert and cooperative. Normal mood and affect.  Intake/Output  from previous day: 07/14 0701 - 07/15 0700 In: 2109.4 [Blood:1059; IV Piggyback:1050.4] Out: 500 [Urine:500] Intake/Output this shift: No intake/output data recorded.  Lab Results: Recent Labs    07/17/17 1414 07/18/17 0604  WBC 8.1 8.8  HGB 7.2* 8.8*  HCT 22.2* 26.4*  PLT 305 238   BMET Recent Labs    07/17/17 1414 07/18/17 0017 07/18/17 0604  NA 133* 137 138  K 5.2* 3.8 3.8  CL 101 107 108  CO2 25 24 24   GLUCOSE 90 86 91  BUN 70* 49* 50*  CREATININE 0.82 0.68 0.56  CALCIUM 9.2 8.6* 8.6*   LFT Recent Labs    07/17/17 1414  PROT 6.2*  ALBUMIN 3.1*  AST 17  ALT 17  ALKPHOS 62  BILITOT 0.6   PT/INR Recent Labs    07/17/17 1414  LABPROT 13.6  INR 1.05   Studies/Results: Dg Chest Port 1 View  Result Date: 07/17/2017 CLINICAL DATA:  72 year old female with shortness of breath and COPD EXAM: PORTABLE CHEST 1 VIEW COMPARISON:  Prior chest x-ray and CT scan of the chest 02/21/2017 FINDINGS: Stable cardiac and mediastinal contours. Atherosclerotic calcifications are present in the transverse aorta. Unchanged biapical pleuroparenchymal scarring. Background hyperinflation and bronchitic changes are similar compared to prior. No pulmonary edema, focal airspace consolidation, pleural effusion or pneumothorax. IMPRESSION: Stable chest x-ray without evidence of acute cardiopulmonary process. Electronically Signed   By: Jacqulynn Cadet M.D.   On: 07/17/2017 14:43    Impression: 72 year old female with history of perforated gastric ulcer secondary to NSAIDs in April 2015, s/p laparotomy, presenting earlier this year both to [facility] and subsequently [facility] with melena and acute blood loss anemia in setting of Plavix and aspirin. EGD March 2019 with large non-bleeding gastric ulcer, negative H.pylori. Returns with acute blood loss anemia, persistent melena in setting of Plavix and endorsing Aleve but unable to quantify amount. Per daughter, patient states melena has  been present for months. Limited historian.  Acute blood loss anemia: Hgb improved from admitting of 7.2 and now 8.8 s/p 2 units PRBCs. No further melena since admission.   As of note, she has significant atherosclerotic disease, notable abdominal bruit, CTA on file from March 2019 with findings as above. Weight is stable since March and denies any postprandial abdominal pain or food aversion. Would benefit from home health services, possibly [facility] to assist with medications. Daughter states patient has a dog that is territorial; therefore it has been difficult to obtain services.    [facility] EGD, timing to be discussed with Dr. Oneida Alar. Remain NPO for now. Risks and benefits discussed in detail with stated understanding by daughter and patient.     Plan: Remain NPO PPI BID Plavix on hold Avoidance of NSAIDs indefinitely: I have discussed again with patient EGD with Dr. Oneida Alar: timing to be discussed. If no EGD today, will start diet   Jackie Needs, PhD, ANP-BC [facility]     LOS: 1 day    07/18/2017, 8:14 AM

## 2017-07-18 NOTE — Progress Notes (Signed)
PROGRESS NOTE    Jackie Walls  WIO:[id number] DOB: 11-Aug-1945 DOA: 07/17/2017 PCP: Dione Housekeeper, MD     Brief Narrative:  72 y.o. female with a history of AAA, COPD, chronic respiratory, HTN, h/o GI bleed with gastric ulcer. She is seen for black, melenotic stools. Patient is not forth coming about when it started, only knows that she has had diarrhea for the past several days.  Patient uncertain as to whether her symptoms are worsening or improving.  Mild lightheadedness and dizziness.  Denies frank blood.  Denies abdominal pain.  Of note; patient expressed ongoing tobacco abuse and reported using aleve for pain at home. Hg on admission 7.0 and BUN 70   Assessment & Plan: 1-acute blood loss anemia -Appears to be secondary to upper GI bleed in the setting of NSAIDs usage and use of Plavix -Patient is status post 2 units of PRBCs on 7/14 -Hemoglobin has stabilized -Continue PPI, follow hemoglobin trend and follow GI recommendations. -Planning for EGD later today. -Continue n.p.o. for now. -Patient instructed/educated about the no use of NSAIDs in the future and will follow GI recommendations into when we can safely restart Plavix.  2-chronic respiratory failure in the setting of COPD (chronic obstructive pulmonary disease) (HCC) -Continue oxygen supplementation -Continue home inhalers/nebulizer treatments -Patient reports no feeling short of breath and only having minimal wheezing at this time.  3-tobacco abuse -I have discussed tobacco cessation with the patient.  I have counseled the patient regarding the negative impacts of continued tobacco use including but not limited to lung cancer, COPD, and cardiovascular disease.  I have discussed alternatives to tobacco and modalities that may help facilitate tobacco cessation including but not limited to biofeedback, hypnosis, and medications.  Total time spent with tobacco counseling was 5 minutes. -nicotine patch ordered  4-HTN  (hypertension) -Blood pressure is soft but stable -Will discontinue antihypertensive regimen for now. -Follow vital signs closely.  5-Aneurysm of abdominal vessel ([place]) -Continue outpatient follow-up. -Patient denies abdominal pain.  6-Protein-calorie malnutrition, severe ([place]) -Supplement as per nutritional service.  7-Hyperkalemia -Repeat potassium within normal limits -Will continue monitoring -No abnormalities appreciated on telemetry.  8-anxiety/depression -Continue as needed Ativan and the use of Remeron. -No suicidal ideation, no hallucinations, stable mood.   DVT prophylaxis: SCDs Code Status: Full code Family Communication: Daughter and sisters at bedside Disposition Plan: Remains in stepdown for now, follow closely patient's vital signs (controlled with low blood pressure), keep n.p.o. and the plan is to pursue endoscopy later today.  Will follow GI recommendations.  Continue PPI.  Consultants:   GI service.  Procedures:   EGD planned for later today 07/18/2017.  Antimicrobials:  Anti-infectives (From admission, onward)   None      Subjective: In no acute distress, chronically ill in appearance, cachectic, denies chest pain, abdominal pain, nausea vomiting.  Reports to be hungry and would like to go home.  Objective: Vitals:   07/18/17 1400 07/18/17 1441 07/18/17 1500 07/18/17 1600  BP: (!) 141/75  (!) 152/79 114/72  Pulse: 82     Resp: 13  15 16   Temp:    98.3 F (36.8 C)  TempSrc:    Oral  SpO2: 94% 96%    Weight:      Height:        Intake/Output Summary (Last 24 hours) at 07/18/2017 1724 Last data filed at 07/18/2017 0500 Gross per 24 hour  Intake 744 ml  Output 500 ml  Net 244 ml   [employer]  07/17/17 1342 07/18/17 0500  Weight: 37.2 kg (82 lb) 38.1 kg (83 lb 15.9 oz)    Examination: General exam: Alert, awake, oriented x 3; reports to be in no acute distress, feeling better and asking to go home.  She is hungry and would like to  have something to eat.  Chronically ill in appearance and significantly underweight. Respiratory system: Scattered rhonchi, very little expiratory wheezing, good air movement bilaterally, using chronic nasal cannula oxygen supplementation.  No using accessory muscles.   Cardiovascular system:RRR. No murmurs, rubs, gallops. Gastrointestinal system: Abdomen is nondistended, soft and nontender. No organomegaly or masses felt. Normal bowel sounds heard.  Positive abdominal bruit appreciated on exam. Central nervous system: Alert and oriented. No focal neurological deficits. Extremities: No C/C/E, +pedal pulses Skin: No rashes, lesions or ulcers Psychiatry: Judgement and insight appear normal. Mood & affect appropriate.    Data Reviewed: I have personally reviewed following labs and imaging studies  CBC: Recent Labs  Lab 07/17/17 1414 07/18/17 0604  WBC 8.1 8.8  NEUTROABS 6.0  --   HGB 7.2* 8.8*  HCT 22.2* 26.4*  MCV 92.5 90.7  PLT 305 419   Basic Metabolic Panel: Recent Labs  Lab 07/17/17 1414 07/18/17 0017 07/18/17 0604  NA 133* 137 138  K 5.2* 3.8 3.8  CL 101 107 108  CO2 25 24 24   GLUCOSE 90 86 91  BUN 70* 49* 50*  CREATININE 0.82 0.68 0.56  CALCIUM 9.2 8.6* 8.6*   GFR: Estimated Creatinine Clearance: 38.8 mL/min (by C-G formula based on SCr of 0.56 mg/dL).   Liver Function Tests: Recent Labs  Lab 07/17/17 1414  AST 17  ALT 17  ALKPHOS 62  BILITOT 0.6  PROT 6.2*  ALBUMIN 3.1*   Coagulation Profile: Recent Labs  Lab 07/17/17 1414  INR 1.05   Anemia Panel: Recent Labs    07/17/17 1414 07/18/17 0018  VITAMINB12 1,189*  --   FOLATE  --  68.4  FERRITIN 189  --   TIBC 266  --   IRON 38  --   RETICCTPCT 2.3  --    Urine analysis:    Component Value Date/Time   COLORURINE YELLOW 04/01/2017 1141   APPEARANCEUR CLEAR 04/01/2017 1141   LABSPEC 1.006 04/01/2017 1141   PHURINE 7.0 04/01/2017 1141   GLUCOSEU NEGATIVE 04/01/2017 1141   HGBUR NEGATIVE  04/01/2017 1141   BILIRUBINUR NEGATIVE 04/01/2017 1141   KETONESUR NEGATIVE 04/01/2017 1141   PROTEINUR NEGATIVE 04/01/2017 1141   UROBILINOGEN 0.2 05/13/2013 1304   NITRITE NEGATIVE 04/01/2017 1141   LEUKOCYTESUR TRACE (A) 04/01/2017 1141    Recent Results (from the past 240 hour(s))  MRSA PCR Screening     Status: None   Collection Time: 07/17/17  5:45 PM  Result Value Ref Range Status   MRSA by PCR NEGATIVE NEGATIVE Final    Comment:        The GeneXpert MRSA Assay (FDA approved for NASAL specimens only), is one component of a comprehensive MRSA colonization surveillance program. It is not intended to diagnose MRSA infection nor to guide or monitor treatment for MRSA infections. Performed at [facility], [address], [place], [place] [address]      Radiology Studies: Dg Chest [place] 1 View  Result Date: 07/17/2017 CLINICAL DATA:  72 year old female with shortness of breath and COPD EXAM: PORTABLE CHEST 1 VIEW COMPARISON:  Prior chest x-ray and CT scan of the chest 02/21/2017 FINDINGS: Stable cardiac and mediastinal contours. Atherosclerotic calcifications are present  in the transverse aorta. Unchanged biapical pleuroparenchymal scarring. Background hyperinflation and bronchitic changes are similar compared to prior. No pulmonary edema, focal airspace consolidation, pleural effusion or pneumothorax. IMPRESSION: Stable chest x-ray without evidence of acute cardiopulmonary process. Electronically Signed   By: Jacqulynn Cadet M.D.   On: 07/17/2017 14:43     Scheduled Meds: . albuterol  3 mL Inhalation Q6H  . doxazosin  4 mg Oral q morning - 10a  . EPINEPHrine      . hydrochlorothiazide  12.5 mg Oral Daily  . lidocaine      . meperidine      . midazolam      . mirtazapine  15 mg Oral QHS  . mometasone-formoterol  2 puff Inhalation BID  . pantoprazole  40 mg Oral BID AC   Continuous Infusions:   LOS: 1 day    Time spent: 35 minutes. Greater than 50% of this time  was spent in direct contact with the patient, coordinating care and discussing relevant ongoing clinical issues, including the need for endoscopy, explaining further follow-up on her hemoglobin trend and further transfusion if required.  Family was updated at bedside.  Over 5 minutes was dedicated additionally as part of smoking cessation counseling.     Barton Dubois, MD [facility] Pager [phone]  If 7PM-7AM, please contact night-coverage [email or web address] Password [facility] 07/18/2017, 5:24 PM

## 2017-07-18 NOTE — Op Note (Addendum)
[facility] Patient Name: Jackie Walls Procedure Date: 07/18/2017 12:12 PM MRN: [id number] Date of Birth: 18-Dec-1945 Attending MD: Barney Drain MD, MD CSN: [id number] Age: 72 Admit Type: Inpatient Procedure:                Upper GI endoscopy: SQ INJECTION/COLD                            BIOPSY/CONTROL BLEEDING Indications:              Melena: LARGE GASTRIC ULCER FEB/MAR 2019 DUE TO                            ASA. CONTINUED ON PLAVIX BUT FAILED TO RETURN FOR                            SERIAL CBCs AND WAS TAKING ALEVE. Providers:                Barney Drain MD, MD, Charlsie Quest. Theda Sers RN, RN,                            Bonnetta Barry, Technician Referring MD:             Dione Housekeeper Medicines:                Meperidine 75 mg IV, Midazolam 5 mg IV Complications:            No immediate complications. Estimated Blood Loss:     Estimated blood loss was minimal. Procedure:                Pre-Anesthesia Assessment:                           - Prior to the procedure, a History and Physical                            was performed, and patient medications and                            allergies were reviewed. The patient's tolerance of                            previous anesthesia was also reviewed. The risks                            and benefits of the procedure and the sedation                            options and risks were discussed with the patient.                            All questions were answered, and informed consent                            was obtained. Prior Anticoagulants: The patient has  taken Plavix (clopidogrel), last dose was 1 day                            prior to procedure. ASA Grade Assessment: III - A                            patient with severe systemic disease. After                            reviewing the risks and benefits, the patient was                            deemed in satisfactory condition to undergo the                        procedure.                           After obtaining informed consent, the endoscope was                            passed under direct vision. Throughout the                            procedure, the patient's blood pressure, pulse, and                            oxygen saturations were monitored continuously. The                            GIF-H190 ([id number]) scope was introduced through the                            mouth, and advanced to the second part of duodenum.                            The upper GI endoscopy was technically difficult                            and complex due to poor endoscopic visualization                            and the patient's agitation. Successful completion                            of the procedure was aided by increasing the dose                            of sedation medication, controlling the bleeding                            and lavage. The patient tolerated the procedure  fairly well. Scope In: 12:31:04 PM Scope Out: 1:16:44 PM Total Procedure Duration: 0 hours 45 minutes 40 seconds  Findings:      The examined esophagus was normal.      A Dieulafoy lesion with oozing bleeding and stigmata of recent bleeding       was found in the gastric fundus. Area was unsuccessfully injected with 8       mL of a 1:10,000 solution of epinephrine for hemostasis. To stop active       bleeding, two hemostatic clips were successfully placed (MR       conditional). There was no bleeding at the end of the procedure.      One ACTIVELY oozing linear gastric ulcer with no VISIBLE VESSEL       IDENTIFIED was found on the greater curvature of the stomach. Area was       successfully injected with 2 mL of a 1:10,000 solution of epinephrine       for hemostasis. This was biopsied with a cold forceps for histology.      The examined duodenum was normal. Impression:               - PUD IMPROVED BUT NOT RESOLVED IN SETTING  OF                            RECENT NSAID USE                           - MELENA AND ANEMIA DUE TO Dieulafoy lesion of                            stomach AND Oozing gastric ulcer Moderate Sedation:      Moderate (conscious) sedation was administered by the endoscopy nurse       and supervised by the endoscopist. The following parameters were       monitored: oxygen saturation, heart rate, blood pressure, and response       to care. Total physician intraservice time was 31 minutes. Recommendation:           - Await pathology results. NEEDS KUB TO CONFIRM                            CLIPS HAVE PASSED PRIOR TO MRI.                           - Soft diet.                           - Continue present medications. RE-START PLAVIX JUL                            16. BENEFITS OUTWEIGH RISK OF BLEEDING IN PT WITH                            SEVERE ACVD.                           - Use Protonix (pantoprazole) 40 mg PO BID for 3  MOS THEN ONCE DAILY FOREVER.                           - Return patient to hospital ward for ongoing care. Procedure Code(s):        --- Professional ---                           [phone], 59, Esophagogastroduodenoscopy, flexible,                            transoral; with control of bleeding, any method                           43239, Esophagogastroduodenoscopy, flexible,                            transoral; with biopsy, single or multiple                           G0500, Moderate sedation services provided by the                            same physician or other qualified health care                            professional performing a gastrointestinal                            endoscopic service that sedation supports,                            requiring the presence of an independent trained                            observer to assist in the monitoring of the                            patient's level of consciousness and physiological                             status; initial 15 minutes of intra-service time;                            patient age 54 years or older (additional time may                            be reported with [phone], as appropriate)                           [phone], Moderate sedation services provided by the                            same physician or other qualified health care  professional performing the diagnostic or                            therapeutic service that the sedation supports,                            requiring the presence of an independent trained                            observer to assist in the monitoring of the                            patient's level of consciousness and physiological                            status; each additional 15 minutes intraservice                            time (List separately in addition to code for                            primary service) Diagnosis Code(s):        --- Professional ---                           G62.69, Dieulafoy lesion (hemorrhagic) of stomach                            and duodenum                           K25.4, Chronic or unspecified gastric ulcer with                            hemorrhage                           K92.1, Melena (includes Hematochezia) CPT copyright 2017 American Medical Association. All rights reserved. The codes documented in this report are preliminary and upon coder review may  be revised to meet current compliance requirements. Barney Drain, MD Barney Drain MD, MD 07/18/2017 1:33:33 PM This report has been signed electronically. Number of Addenda: 0

## 2017-07-18 NOTE — Plan of Care (Signed)
SPOKE TO DAUGHTER. PT RAN OUT OF HYDROCODONE AND INSTEAD OF GOING BACK TO [facility] FOR A REFILL SHE HAD SOMEONE PROVIDE ALEVE. EXPLAINED TO DAUGHTER SHE SHOULD AVOID ASA, IBUPROFEN, ALEVE AND ALL NSAIDs. SHE SHOULD SEE HER PCP FOR A REFILL OF HYDROCODONE BECAUSE IT WILL NOT CAUSE ULCERS OR EROSIVE GASTRITIS.  WE WILL STRESS WITH PT ON JUL 16 THAT SHE SHOULD AVOID ASA/NSAIDS INDEFINITELY BECAUSE HER STOMACH WILL NOT HEAL AND SHE WILL CONTINUE TO BLEED AND IF SHE CANNOT TAKE PLAVIX SHE IS AT RISK FOR STROKE OR A DEATH DUE TO DEAD GUT.

## 2017-07-18 NOTE — Progress Notes (Addendum)
Patient has terrible cough most likely bronchitis, some wheezes on rt side.  Decreasing neb to Q6

## 2017-07-18 NOTE — Interval H&P Note (Signed)
History and Physical Interval Note:  07/18/2017 12:11 PM  Jackie Walls  has presented today for surgery, with the diagnosis of acute blood loss anemia, heme positive stool, hx of PUD  The various methods of treatment have been discussed with the patient and family. After consideration of risks, benefits and other options for treatment, the patient has consented to  Procedure(s): ESOPHAGOGASTRODUODENOSCOPY (EGD) (N/A) as a surgical intervention .  The patient's history has been reviewed, patient examined, no change in status, stable for surgery.  I have reviewed the patient's chart and labs.  Questions were answered to the patient's satisfaction.     [employer]

## 2017-07-18 NOTE — H&P (View-Only) (Signed)
Referring Provider:  Dr. Nehemiah Settle Primary Care Physician:  Dione Housekeeper, MD Primary Gastroenterologist:  Dr. Gala Romney   Date of Admission: 07/17/17 Date of Consultation: 07/18/17  Reason for Consultation:  UGI bleed   HPI:  Jackie Walls is a 72 y.o. year old female with a history of perforated gastric ulcer secondary to NSAIDs in April 2015, s/p laparotomy. Last inpatient March 2019 with acute blood loss anemia, melena, undergoing EGD March 2019 with large non-bleeding gastric ulcer secondary to NSAIDs. She had been on aspirin and Plavix. Plavix recommended to resume once no further melena. History also notable for EGD in Feb 2019 at [facility], requiring admission. Was due for surveillance with our office but did not return to arrange. Presented this admission with melena and Hgb 7.2. Her last Hgb on file was 10.1 in April 2019. Received 2 units PRBCs, now with Hgb 8.8.   Daughter at bedside. States she has seen melena since going home in March 2019. Daughter states patient was not forthcoming with reports of melena. No abdominal pain, no N/V. Lives with son. Son works during the day, so she is by herself from [place] to 4pm. No dysphagia. Has been taking Aleve as she is out of pain medication from PCP. Daughter believes she has been taking Protonix BID. No further melena since admission.   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 4.6 cm partially thrombosed infra-abdominal aortic aneurysm similar to prior findings. Focal high-grade narrowing of proximal SMA with post stenotic dilatation up to 11mm diameter, remains patent. Focal area of high-grade narrowing at the origin of celiac artery with mild focal post stenotic dilatation of vessel up to 8 mm diameter but celiac axis remains patent. IMA not visualized.   In March 2019, weight was 83 lbs. Remains at this weight.      Past Medical History:  Diagnosis Date  . Abdominal  aortic aneurysm ([place])   . ALLERGIC RHINITIS   . Anxiety   . Bronchitis   . Chronic diarrhea   . COPD (chronic obstructive pulmonary disease) ([place])   . Duodenal ulcer   . Hyperlipidemia   . Hypertension   . IDA (iron deficiency anemia)   . Multiple gastric ulcers   . Thoracic aortic aneurysm [facility])     Past Surgical History:  Procedure Laterality Date  . BIOPSY  04/02/2017   Procedure: BIOPSY;  Surgeon: Danie Binder, MD;  Location: [facility] END[facility] SUITE;  Service: Endoscopy;;  gastric and gastric ulcer  . CAROTID ENDARTERECTOMY Right   . CESAREAN SECTION    . COLONOSCOPY N/A 08/13/2013   NIO:[id number] polyps-removed as described above. Subtlyabnormal colonic mucosa-status post segmental biopsy.  EGD not  . ESOPHAGOGASTRODUODENOSCOPY N/A 05/15/2013   Dr. Oneida Alar: moderate erosive gastritis, duodenal ulcers, visible vessel with active oozing s/p bleeding control therapy, NSAID induced gastritis  . ESOPHAGOGASTRODUODENOSCOPY N/A 08/13/2013   RMR: EGD not needed and not done  . ESOPHAGOGASTRODUODENOSCOPY  02/23/2017   [facility]: arge 4 cm, deep, cratered ulcer along the lesser curvature of the stomach but no signs of active bleeding. H. pylori stool antigen is negative.   . ESOPHAGOGASTRODUODENOSCOPY N/A 04/02/2017   Dr. Oneida Alar: normal esophagus, large non-bleeding cratered gastric ulcer, normal duodenum, secondary to aspirin   . GASTRORRHAPHY N/A 04/25/2013   Procedure: GASTRORRHAPHY;  Surgeon: Jamesetta So, MD;  Location: [facility] ORS;  Service: General;  Laterality: N/A;  . LAPAROTOMY N/A 04/25/2013   Procedure: EXPLORATORY LAPAROTOMY;  Surgeon: Jamesetta So, MD;  Location: [facility] ORS;  Service: General;  Laterality: N/A;    Prior to Admission medications   Medication Sig Start Date End Date Taking? Authorizing Provider  albuterol (PROVENTIL) (2.5 MG/3ML) 0.083% nebulizer solution Inhale 3 mLs into the lungs every 4 (four) hours. 02/28/17  Yes [provider]  alendronate (FOSAMAX) 70 MG tablet Take  70 mg by mouth once a week. Take with a full glass of water on an empty stomach. Takes on Sat.   Yes [provider]  amLODipine (NORVASC) 10 MG tablet Take 1 tablet by mouth daily. 02/28/17  Yes [provider]  atorvastatin (LIPITOR) 40 MG tablet Take 1 tablet by mouth daily. 06/06/17  Yes [provider]  budesonide-formoterol (SYMBICORT) 160-4.5 MCG/ACT inhaler Inhale 2 puffs into the lungs 2 (two) times daily.     Yes [provider]  clopidogrel (PLAVIX) 75 MG tablet Take 1 tablet (75 mg total) by mouth daily. 04/04/17  Yes Fields, Sandi L, MD  doxazosin (CARDURA) 4 MG tablet Take 4 mg by mouth every morning.    Yes [provider]  hydrochlorothiazide (MICROZIDE) 12.5 MG capsule Take 1 capsule by mouth daily. 02/28/17  Yes [provider]  LORazepam (ATIVAN) 0.5 MG tablet Take 1 tablet by mouth every 8 (eight) hours as needed for anxiety.  02/28/17  Yes [provider]  losartan (COZAAR) 50 MG tablet Take 100 mg by mouth every morning.    Yes [provider]  mirtazapine (REMERON) 15 MG tablet Take 1 tablet by mouth at bedtime. 06/06/17  Yes [provider]  pantoprazole (PROTONIX) 40 MG tablet Take 1 tablet (40 mg total) by mouth 2 (two) times daily. 04/04/17  Yes Dhungel, Flonnie Overman, MD    Current Facility-Administered Medications  Medication Dose Route Frequency Provider Last Rate Last Dose  . acetaminophen (TYLENOL) tablet 650 mg  650 mg Oral Q6H PRN Truett Mainland, DO       Or  . acetaminophen (TYLENOL) suppository 650 mg  650 mg Rectal Q6H PRN Truett Mainland, DO      . albuterol (PROVENTIL) (2.5 MG/3ML) 0.083% nebulizer solution 3 mL  3 mL Inhalation Q6H Reubin Milan, MD   3 mL at 07/18/17 0752  . atorvastatin (LIPITOR) tablet 40 mg  40 mg Oral Daily Truett Mainland, DO      . doxazosin (CARDURA) tablet 4 mg  4 mg Oral q morning - 10a Truett Mainland, DO      . famotidine (PEPCID) IVPB 20 mg premix  20  mg Intravenous Q12H Stinson, Jacob J, DO      . hydrochlorothiazide (MICROZIDE) capsule 12.5 mg  12.5 mg Oral Daily Truett Mainland, DO      . LORazepam (ATIVAN) tablet 0.5 mg  0.5 mg Oral Q8H PRN Truett Mainland, DO      . mirtazapine (REMERON) tablet 15 mg  15 mg Oral QHS Truett Mainland, DO   15 mg at 07/17/17 2200  . mometasone-formoterol (DULERA) 200-5 MCG/ACT inhaler 2 puff  2 puff Inhalation BID Truett Mainland, DO   2 puff at 07/18/17 0757  . ondansetron (ZOFRAN) tablet 4 mg  4 mg Oral Q6H PRN Truett Mainland, DO       Or  . ondansetron [facility]) injection 4 mg  4 mg Intravenous Q6H PRN Truett Mainland, DO      . pantoprazole (PROTONIX) EC tablet 40 mg  40 mg Oral  BID AC Truett Mainland, DO        Allergies as of 07/17/2017 - Review Complete 07/17/2017  Allergen Reaction Noted  . Ibuprofen Other (See Comments) 05/15/2013    Family History  Problem Relation Age of Onset  . Cancer Sister   . Colon cancer Neg Hx     Social History   Socioeconomic History  . Marital status: Widowed    Spouse name: Not on file  . Number of children: Not on file  . Years of education: Not on file  . Highest education level: Not on file  Occupational History  . Not on file  Social Needs  . Financial resource strain: Not on file  . Food insecurity:    Worry: Not on file    Inability: Not on file  . Transportation needs:    Medical: Not on file    Non-medical: Not on file  Tobacco Use  . Smoking status: Current Every Day Smoker    Packs/day: 0.25    Types: Cigarettes    Start date: 01/05/1948  . Smokeless tobacco: Never Used  Substance and Sexual Activity  . Alcohol use: No  . Drug use: No  . Sexual activity: Not on file  Lifestyle  . Physical activity:    Days per week: Not on file    Minutes per session: Not on file  . Stress: Not on file  Relationships  . Social connections:    Talks on phone: Not on file    Gets together: Not on file    Attends religious service: Not  on file    Active member of club or organization: Not on file    Attends meetings of clubs or organizations: Not on file    Relationship status: Not on file  . Intimate partner violence:    Fear of current or ex partner: Not on file    Emotionally abused: Not on file    Physically abused: Not on file    Forced sexual activity: Not on file  Other Topics Concern  . Not on file  Social History Narrative   Lives with her husband at home.    Review of Systems: Gen: Denies fever, chills, loss of appetite, change in weight or weight loss CV: Denies chest pain, heart palpitations, syncope, edema  Resp: +cough  GI: see HPI  GU : Denies urinary burning, urinary frequency, urinary incontinence.  MS: Denies joint pain,swelling, cramping Derm: Denies rash, itching, dry skin Psych: Denies depression, anxiety,confusion, or memory loss Heme: see HPI   Physical Exam: Vital signs in last 24 hours: Temp:  [97.6 F (36.4 C)-98.8 F (37.1 C)] 98.8 F (37.1 C) (07/15 0721) Pulse Rate:  [65-99] 79 (07/15 0721) Resp:  [14-39] 23 (07/15 0721) BP: (69-118)/(53-76) 97/71 (07/14 2230) SpO2:  [83 %-100 %] 97 % (07/15 0757) Weight:  [82 lb (37.2 kg)-83 lb 15.9 oz (38.1 kg)] 83 lb 15.9 oz (38.1 kg) (07/15 0500) Last BM Date: 07/17/17 General:   Alert,  Chronically-ill appearing, cachectic Head:  Normocephalic and atraumatic. Eyes:  Sclera clear, no icterus.   Conjunctiva pink. Ears:  Extremely hard of hearing  Nose:  No deformity, discharge,  or lesions. Mouth:  No deformity or lesions, dentition normal. Lungs:  Mild expiratory wheeze Heart:  S1 S2 present without obvious murmur  Abdomen:  Soft, nontender and nondistended. Notable abdominal bruit Rectal:  Deferred  Msk:  Symmetrical without gross deformities. Normal posture. Extremities:  Markedly thin upper and lower extremities, no  edema Neurologic:  Alert and  oriented x4 Psych:  Alert and cooperative. Normal mood and affect.  Intake/Output  from previous day: 07/14 0701 - 07/15 0700 In: 2109.4 [Blood:1059; IV Piggyback:1050.4] Out: 500 [Urine:500] Intake/Output this shift: No intake/output data recorded.  Lab Results: Recent Labs    07/17/17 1414 07/18/17 0604  WBC 8.1 8.8  HGB 7.2* 8.8*  HCT 22.2* 26.4*  PLT 305 238   BMET Recent Labs    07/17/17 1414 07/18/17 0017 07/18/17 0604  NA 133* 137 138  K 5.2* 3.8 3.8  CL 101 107 108  CO2 25 24 24   GLUCOSE 90 86 91  BUN 70* 49* 50*  CREATININE 0.82 0.68 0.56  CALCIUM 9.2 8.6* 8.6*   LFT Recent Labs    07/17/17 1414  PROT 6.2*  ALBUMIN 3.1*  AST 17  ALT 17  ALKPHOS 62  BILITOT 0.6   PT/INR Recent Labs    07/17/17 1414  LABPROT 13.6  INR 1.05   Studies/Results: Dg Chest Port 1 View  Result Date: 07/17/2017 CLINICAL DATA:  72 year old female with shortness of breath and COPD EXAM: PORTABLE CHEST 1 VIEW COMPARISON:  Prior chest x-ray and CT scan of the chest 02/21/2017 FINDINGS: Stable cardiac and mediastinal contours. Atherosclerotic calcifications are present in the transverse aorta. Unchanged biapical pleuroparenchymal scarring. Background hyperinflation and bronchitic changes are similar compared to prior. No pulmonary edema, focal airspace consolidation, pleural effusion or pneumothorax. IMPRESSION: Stable chest x-ray without evidence of acute cardiopulmonary process. Electronically Signed   By: Jacqulynn Cadet M.D.   On: 07/17/2017 14:43    Impression: 72 year old female with history of perforated gastric ulcer secondary to NSAIDs in April 2015, s/p laparotomy, presenting earlier this year both to [facility] and subsequently [facility] with melena and acute blood loss anemia in setting of Plavix and aspirin. EGD March 2019 with large non-bleeding gastric ulcer, negative H.pylori. Returns with acute blood loss anemia, persistent melena in setting of Plavix and endorsing Aleve but unable to quantify amount. Per daughter, patient states melena has  been present for months. Limited historian.  Acute blood loss anemia: Hgb improved from admitting of 7.2 and now 8.8 s/p 2 units PRBCs. No further melena since admission.   As of note, she has significant atherosclerotic disease, notable abdominal bruit, CTA on file from March 2019 with findings as above. Weight is stable since March and denies any postprandial abdominal pain or food aversion. Would benefit from home health services, possibly [facility] to assist with medications. Daughter states patient has a dog that is territorial; therefore it has been difficult to obtain services.    Needs EGD, timing to be discussed with Dr. Oneida Alar. Remain NPO for now. Risks and benefits discussed in detail with stated understanding by daughter and patient.     Plan: Remain NPO PPI BID Plavix on hold Avoidance of NSAIDs indefinitely: I have discussed again with patient EGD with Dr. Oneida Alar: timing to be discussed. If no EGD today, will start diet   Annitta Needs, PhD, ANP-BC [facility]     LOS: 1 day    07/18/2017, 8:14 AM

## 2017-07-19 DIAGNOSIS — Z720 Tobacco use: Secondary | ICD-10-CM

## 2017-07-19 LAB — CBC WITH DIFFERENTIAL/PLATELET
BASOS ABS: 0.1 10*3/uL (ref 0.0–0.1)
Basophils Relative: 1 %
Eosinophils Absolute: 0.2 10*3/uL (ref 0.0–0.7)
Eosinophils Relative: 2 %
HEMATOCRIT: 27 % — AB (ref 36.0–46.0)
HEMOGLOBIN: 9 g/dL — AB (ref 12.0–15.0)
LYMPHS PCT: 13 %
Lymphs Abs: 1.2 10*3/uL (ref 0.7–4.0)
MCH: 30.8 pg (ref 26.0–34.0)
MCHC: 33.3 g/dL (ref 30.0–36.0)
MCV: 92.5 fL (ref 78.0–100.0)
Monocytes Absolute: 0.4 10*3/uL (ref 0.1–1.0)
Monocytes Relative: 4 %
NEUTROS ABS: 7.1 10*3/uL (ref 1.7–7.7)
Neutrophils Relative %: 80 %
Platelets: 255 10*3/uL (ref 150–400)
RBC: 2.92 MIL/uL — ABNORMAL LOW (ref 3.87–5.11)
RDW: 16 % — ABNORMAL HIGH (ref 11.5–15.5)
WBC: 8.8 10*3/uL (ref 4.0–10.5)

## 2017-07-19 MED ORDER — NICOTINE 14 MG/24HR TD PT24: 14.0000 mg | MEDICATED_PATCH | Freq: Every day | TRANSDERMAL | 0 refills | Status: DC

## 2017-07-19 MED ORDER — LOSARTAN POTASSIUM 50 MG PO TABS: 50.0000 mg | ORAL_TABLET | Freq: Every morning | ORAL | Status: DC

## 2017-07-19 MED ORDER — AMLODIPINE BESYLATE 10 MG PO TABS: 5.0000 mg | ORAL_TABLET | Freq: Every day | ORAL | Status: DC

## 2017-07-19 MED ORDER — ACETAMINOPHEN 325 MG PO TABS: 650.0000 mg | ORAL_TABLET | Freq: Four times a day (QID) | ORAL | 0 refills | Status: AC | PRN

## 2017-07-19 NOTE — Discharge Summary (Addendum)
Physician Discharge Summary  Jackie Walls AST:[id number] DOB: 05/07/1945 DOA: 07/17/2017  PCP: Dione Housekeeper, MD  Admit date: 07/17/2017 Discharge date: 07/19/2017  Time spent: 35 minutes  Recommendations for Outpatient Follow-up:  1. Reassess BP and adjust antihypertensive regimen as needed  2. Continue assisting patient with smoking cessation 3. Repeat BMET to follow electrolytes and renal function  4. Repeat CBC to follow Hgb trend    Discharge Diagnoses:  Principal Problem:   Acute blood loss anemia Active Problems:   COPD (chronic obstructive pulmonary disease) (HCC)   HTN (hypertension)   Aneurysm of abdominal vessel (HCC)   Protein-calorie malnutrition, severe (HCC)   Chronic respiratory failure (HCC)   Upper GI bleed   Hyperkalemia   Tobacco abuse   Discharge Condition: stable and improved. Discharge home with instructions to follow up with PCP in 10 days.  Diet recommendation: heart healthy diet   Filed Weights   07/17/17 1342 07/18/17 0500  Weight: 37.2 kg (82 lb) 38.1 kg (83 lb 15.9 oz)    Brief Narrative of present illness:  72 y.o.femalewith a history of AAA, COPD, chronic respiratory, HTN, h/o GI bleed with gastric ulcer. She is seen for black, melenotic stools. Patient is not forth coming about when it started, only knows that she has had diarrhea for the past several days.Patient uncertain as to whether her symptoms are worsening or improving. Mild lightheadedness and dizziness. Denies frank blood. Denies abdominal pain.  Of note; patient expressed ongoing tobacco abuse and reported using aleve for pain at home. Hg on admission 7.0 and BUN 70   Hospital Course:  1-acute blood loss anemia -Appears to be secondary to upper GI bleed in the setting of NSAIDs usage and use of Plavix -Patient is status post 2 units of PRBCs on 7/14 -S/P EGD with hemoclip on dieulafoy lesion. -no further signs of active bleeding appreciated -Hgb 9.0 at discharge     -Patient instructed/educated about the no use of NSAIDs in the future. Per GI service ok to resume plavix right away. -discharge on protonix BID (looking for at least 3 months of continue therapy and then can be switched to once a day).  2-chronic respiratory failure in the setting of COPD (chronic obstructive pulmonary disease) with hypoxia. -Continue oxygen supplementation -Continue home inhalers/nebulizer treatment -Patient reports no feeling short of breath and is only having minimal wheezing at this time.  3-tobacco abuse -extended cessation counseling provided on 07/17/17 -nicotine patch ordered at discharge.  4-HTN (hypertension) -Blood pressure stable -will resume home antihypertensive regimen and patient was advised to follow heart healthy diet. -BP to be reassess by PCP at follow up visit and further adjustment to be done as needed.   5-Aneurysm of abdominal vessel ([place]) -Continue outpatient follow-up. -Patient denies abdominal pain.  6-Protein-calorie malnutrition, severe ([place]) -Supplement as per nutritional service. -Ensure BID encouraged.  7-Hyperkalemia -Repeat potassium within normal limits. -Will recommend BMET to follow up electrolytes at follow up visit.  -No abnormalities appreciated on telemetry.  8-anxiety/depression -Continue as needed Ativan and the use of Remeron. -No suicidal ideation, no hallucinations, stable mood.    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   Consultations:  GI  Discharge Exam: Vitals:   07/19/17 0834 07/19/17 1140  BP:    Pulse:  83  Resp:  17  Temp:  97.6 F (36.4 C)  SpO2: 100% 96%    General exam: Alert, awake, oriented x 3; reports no acute complaints. Feeling much  better and ready to go home. Chronically ill in appearance and cachetic. Tolerating diet and no further signs of bleeding.  Respiratory system: Scattered rhonchi, very little expiratory wheezing, good air  movement bilaterally, using chronic nasal cannula oxygen supplementation.  No using accessory muscles.   Cardiovascular system:RRR. No murmurs, rubs, gallops. Gastrointestinal system: Abdomen is nondistended, soft and nontender. No organomegaly or masses felt. Normal bowel sounds heard.  Positive abdominal bruit appreciated on exam. Central nervous system: Alert and oriented. No focal neurological deficits. Extremities: No C/C/E, +pedal pulses Skin: No rashes, lesions or ulcers Psychiatry: Judgement and insight appear normal. Mood & affect appropriate.      Discharge Instructions   Discharge Instructions    Diet - low sodium heart healthy   Complete by:  As directed    Discharge instructions   Complete by:  As directed    Stop smoking Keep yourself well hydrated  Start using ensure BID Stop use of NSAID's Follow up with PCP in 10 days.     Allergies as of 07/19/2017      Reactions   Ibuprofen Other (See Comments)   History of gastric ulcers.      Medication List    TAKE these medications   acetaminophen 325 MG tablet Commonly known as:  TYLENOL Take 2 tablets (650 mg total) by mouth every 6 (six) hours as needed for mild pain (or Fever >/= 101).   albuterol (2.5 MG/3ML) 0.083% nebulizer solution Commonly known as:  PROVENTIL Inhale 3 mLs into the lungs every 4 (four) hours.   alendronate 70 MG tablet Commonly known as:  FOSAMAX Take 70 mg by mouth once a week. Take with a full glass of water on an empty stomach. Takes on Sat.   amLODipine 10 MG tablet Commonly known as:  NORVASC Take 0.5 tablets (5 mg total) by mouth daily. What changed:  how much to take   atorvastatin 40 MG tablet Commonly known as:  LIPITOR Take 1 tablet by mouth daily.   budesonide-formoterol 160-4.5 MCG/ACT inhaler Commonly known as:  SYMBICORT Inhale 2 puffs into the lungs 2 (two) times daily.   clopidogrel 75 MG tablet Commonly known as:  PLAVIX Take 1 tablet (75 mg total) by mouth  daily.   doxazosin 4 MG tablet Commonly known as:  CARDURA Take 4 mg by mouth every morning.   hydrochlorothiazide 12.5 MG capsule Commonly known as:  MICROZIDE Take 1 capsule by mouth daily.   LORazepam 0.5 MG tablet Commonly known as:  ATIVAN Take 1 tablet by mouth every 8 (eight) hours as needed for anxiety.   losartan 50 MG tablet Commonly known as:  COZAAR Take 1 tablet (50 mg total) by mouth every morning. What changed:  how much to take   mirtazapine 15 MG tablet Commonly known as:  REMERON Take 1 tablet by mouth at bedtime.   nicotine 14 mg/24hr patch Commonly known as:  NICODERM CQ - dosed in mg/24 hours Place 1 patch (14 mg total) onto the skin daily. Start taking on:  07/20/2017   pantoprazole 40 MG tablet Commonly known as:  PROTONIX Take 1 tablet (40 mg total) by mouth 2 (two) times daily.      Allergies  Allergen Reactions  . Ibuprofen Other (See Comments)    History of gastric ulcers.   Follow-up Information    Dione Housekeeper, MD. Schedule an appointment as soon as possible for a visit in 10 day(s).   Specialty:  Family Medicine Contact information: [address]  [place] [address] [phone]           The results of significant diagnostics from this hospitalization (including imaging, microbiology, ancillary and laboratory) are listed below for reference.    Significant Diagnostic Studies: Dg Chest Port 1 View  Result Date: 07/17/2017 CLINICAL DATA:  72 year old female with shortness of breath and COPD EXAM: PORTABLE CHEST 1 VIEW COMPARISON:  Prior chest x-ray and CT scan of the chest 02/21/2017 FINDINGS: Stable cardiac and mediastinal contours. Atherosclerotic calcifications are present in the transverse aorta. Unchanged biapical pleuroparenchymal scarring. Background hyperinflation and bronchitic changes are similar compared to prior. No pulmonary edema, focal airspace consolidation, pleural effusion or pneumothorax. IMPRESSION: Stable  chest x-ray without evidence of acute cardiopulmonary process. Electronically Signed   By: Jacqulynn Cadet M.D.   On: 07/17/2017 14:43    Microbiology: Recent Results (from the past 240 hour(s))  MRSA PCR Screening     Status: None   Collection Time: 07/17/17  5:45 PM  Result Value Ref Range Status   MRSA by PCR NEGATIVE NEGATIVE Final    Comment:        The GeneXpert MRSA Assay (FDA approved for NASAL specimens only), is one component of a comprehensive MRSA colonization surveillance program. It is not intended to diagnose MRSA infection nor to guide or monitor treatment for MRSA infections. Performed at [facility], [address], [place], [place] [address]      Labs: Basic Metabolic Panel: Recent Labs  Lab 07/17/17 1414 07/18/17 0017 07/18/17 0604  NA 133* 137 138  K 5.2* 3.8 3.8  CL 101 107 108  CO2 25 24 24   GLUCOSE 90 86 91  BUN 70* 49* 50*  CREATININE 0.82 0.68 0.56  CALCIUM 9.2 8.6* 8.6*   Liver Function Tests: Recent Labs  Lab 07/17/17 1414  AST 17  ALT 17  ALKPHOS 62  BILITOT 0.6  PROT 6.2*  ALBUMIN 3.1*   CBC: Recent Labs  Lab 07/17/17 1414 07/18/17 0604 07/19/17 0501  WBC 8.1 8.8 8.8  NEUTROABS 6.0  --  7.1  HGB 7.2* 8.8* 9.0*  HCT 22.2* 26.4* 27.0*  MCV 92.5 90.7 92.5  PLT 305 238 255    Signed:  Barton Dubois MD.  [facility] 07/19/2017, 12:52 PM

## 2017-07-19 NOTE — Progress Notes (Addendum)
    Subjective: No abdominal pain. Brown mixed with dark stool yesterday evening. Tolerating diet. Wants to go home. Understands she can't take any NSAIDs.   Objective: Vital signs in last 24 hours: Temp:  [98 F (36.7 C)-99 F (37.2 C)] 98.8 F (37.1 C) (07/16 0719) Pulse Rate:  [72-97] 77 (07/16 0719) Resp:  [12-21] 14 (07/16 0719) BP: (82-163)/(62-107) 140/70 (07/16 0600) SpO2:  [92 %-99 %] 96 % (07/16 0719) Last BM Date: 07/17/17 General:   Alert and oriented, pleasant, cachectic-appearing Head:  Normocephalic and atraumatic. Abdomen:  Bowel sounds present, soft, non-tender, non-distended. No HSM or hernias noted. No rebound or guarding. No masses appreciated  Extremities:  Without  edema. Neurologic:  Alert and  oriented x4  Intake/Output from previous day: No intake/output data recorded. Intake/Output this shift: No intake/output data recorded.  Lab Results: Recent Labs    07/17/17 1414 07/18/17 0604 07/19/17 0501  WBC 8.1 8.8 8.8  HGB 7.2* 8.8* 9.0*  HCT 22.2* 26.4* 27.0*  PLT 305 238 255   BMET Recent Labs    07/17/17 1414 07/18/17 0017 07/18/17 0604  NA 133* 137 138  K 5.2* 3.8 3.8  CL 101 107 108  CO2 25 24 24   GLUCOSE 90 86 91  BUN 70* 49* 50*  CREATININE 0.82 0.68 0.56  CALCIUM 9.2 8.6* 8.6*   LFT Recent Labs    07/17/17 1414  PROT 6.2*  ALBUMIN 3.1*  AST 17  ALT 17  ALKPHOS 62  BILITOT 0.6   PT/INR Recent Labs    07/17/17 1414  LABPROT 13.6  INR 1.05     Studies/Results: Dg Chest Port 1 View  Result Date: 07/17/2017 CLINICAL DATA:  72 year old female with shortness of breath and COPD EXAM: PORTABLE CHEST 1 VIEW COMPARISON:  Prior chest x-ray and CT scan of the chest 02/21/2017 FINDINGS: Stable cardiac and mediastinal contours. Atherosclerotic calcifications are present in the transverse aorta. Unchanged biapical pleuroparenchymal scarring. Background hyperinflation and bronchitic changes are similar compared to prior. No  pulmonary edema, focal airspace consolidation, pleural effusion or pneumothorax. IMPRESSION: Stable chest x-ray without evidence of acute cardiopulmonary process. Electronically Signed   By: Jacqulynn Cadet M.D.   On: 07/17/2017 14:43    Assessment: 72 year old female admitted with melena and acute blood loss anemia secondary to PUD and Dieulafoy lesion, s/p EGD yesterday with epi injection and clip of Dieulafoy, epi injection of oozing gastric ulcer. No visible vessel identified. PUD improved but not resolved in setting of NSAIDs. Hgb stable.   Plan: Protonix BID X 3 months then daily indefinitely Absolute avoidance of NSAIDs: discussed with patient Plavix may resume July 16th as benefits outweigh risks Soft diet Keep appt with Dr. Gala Romney 9/27 as outpatient   Annitta Needs, PhD, ANP-BC [facility]    LOS: 2 days    07/19/2017, 8:03 AM

## 2017-07-21 ENCOUNTER — Encounter (HOSPITAL_COMMUNITY): Payer: Self-pay | Admitting: Gastroenterology

## 2017-07-26 NOTE — Progress Notes (Signed)
LMOM to call.

## 2017-07-26 NOTE — Progress Notes (Signed)
CC'D TO PCP °

## 2017-07-28 NOTE — Progress Notes (Signed)
LMOM to call.

## 2017-08-01 NOTE — Progress Notes (Signed)
Letter mailed for pt to call.  

## 2017-09-30 ENCOUNTER — Telehealth: Payer: Self-pay | Admitting: Internal Medicine

## 2017-09-30 ENCOUNTER — Encounter

## 2017-09-30 ENCOUNTER — Ambulatory Visit: Payer: Medicare Other | Admitting: Internal Medicine

## 2017-09-30 ENCOUNTER — Encounter: Payer: Self-pay | Admitting: Internal Medicine

## 2017-09-30 NOTE — Telephone Encounter (Signed)
PATIENT WAS A NO SHOW AND LETTER SENT  °

## 2018-03-12 ENCOUNTER — Encounter (HOSPITAL_COMMUNITY): Payer: Self-pay | Admitting: Emergency Medicine

## 2018-03-12 ENCOUNTER — Other Ambulatory Visit: Payer: Self-pay

## 2018-03-12 ENCOUNTER — Observation Stay (HOSPITAL_COMMUNITY)
Admission: EM | Admit: 2018-03-12 | Discharge: 2018-03-13 | Disposition: A | Discharge status: Home / Self Care | Payer: Medicare Other | Attending: Family Medicine | Admitting: Family Medicine

## 2018-03-12 ENCOUNTER — Emergency Department (HOSPITAL_COMMUNITY): Payer: Medicare Other

## 2018-03-12 DIAGNOSIS — R531 Weakness: Secondary | ICD-10-CM | POA: Diagnosis not present

## 2018-03-12 DIAGNOSIS — I10 Essential (primary) hypertension: Secondary | ICD-10-CM | POA: Diagnosis present

## 2018-03-12 DIAGNOSIS — E860 Dehydration: Secondary | ICD-10-CM | POA: Insufficient documentation

## 2018-03-12 DIAGNOSIS — F17210 Nicotine dependence, cigarettes, uncomplicated: Secondary | ICD-10-CM | POA: Diagnosis not present

## 2018-03-12 DIAGNOSIS — E43 Unspecified severe protein-calorie malnutrition: Secondary | ICD-10-CM | POA: Diagnosis not present

## 2018-03-12 DIAGNOSIS — J441 Chronic obstructive pulmonary disease with (acute) exacerbation: Principal | ICD-10-CM | POA: Insufficient documentation

## 2018-03-12 DIAGNOSIS — R05 Cough: Secondary | ICD-10-CM | POA: Diagnosis present

## 2018-03-12 DIAGNOSIS — R059 Cough, unspecified: Secondary | ICD-10-CM

## 2018-03-12 DIAGNOSIS — N179 Acute kidney failure, unspecified: Secondary | ICD-10-CM | POA: Diagnosis not present

## 2018-03-12 DIAGNOSIS — R197 Diarrhea, unspecified: Secondary | ICD-10-CM | POA: Diagnosis not present

## 2018-03-12 LAB — CBC
HCT: 35.5 % — ABNORMAL LOW (ref 36.0–46.0)
Hemoglobin: 11 g/dL — ABNORMAL LOW (ref 12.0–15.0)
MCH: 27.1 pg (ref 26.0–34.0)
MCHC: 31 g/dL (ref 30.0–36.0)
MCV: 87.4 fL (ref 80.0–100.0)
Platelets: 368 10*3/uL (ref 150–400)
RBC: 4.06 MIL/uL (ref 3.87–5.11)
RDW: 14.8 % (ref 11.5–15.5)
WBC: 13.4 10*3/uL — AB (ref 4.0–10.5)
nRBC: 0 % (ref 0.0–0.2)

## 2018-03-12 LAB — COMPREHENSIVE METABOLIC PANEL
ALK PHOS: 105 U/L (ref 38–126)
ALT: 10 U/L (ref 0–44)
ANION GAP: 11 (ref 5–15)
AST: 14 U/L — ABNORMAL LOW (ref 15–41)
Albumin: 3.2 g/dL — ABNORMAL LOW (ref 3.5–5.0)
BUN: 26 mg/dL — ABNORMAL HIGH (ref 8–23)
CALCIUM: 8.8 mg/dL — AB (ref 8.9–10.3)
CO2: 25 mmol/L (ref 22–32)
Chloride: 100 mmol/L (ref 98–111)
Creatinine, Ser: 1.06 mg/dL — ABNORMAL HIGH (ref 0.44–1.00)
GFR calc Af Amer: >60 mL/min (ref >60)
GFR calc non Af Amer: 52 mL/min — ABNORMAL LOW (ref >60)
Glucose, Bld: 134 mg/dL — ABNORMAL HIGH (ref 70–99)
Potassium: 3.1 mmol/L — ABNORMAL LOW (ref 3.5–5.1)
Sodium: 136 mmol/L (ref 135–145)
TOTAL PROTEIN: 7.1 g/dL (ref 6.5–8.1)
Total Bilirubin: 0.3 mg/dL (ref 0.3–1.2)

## 2018-03-12 LAB — I-STAT TROPONIN, ED: Troponin i, poc: 0.03 ng/mL (ref 0.00–0.08)

## 2018-03-12 LAB — LACTIC ACID, PLASMA: Lactic Acid, Venous: 1 mmol/L (ref 0.5–1.9)

## 2018-03-12 LAB — INFLUENZA PANEL BY PCR (TYPE A & B)
Influenza A By PCR: NEGATIVE
Influenza B By PCR: NEGATIVE

## 2018-03-12 MED ORDER — POTASSIUM CHLORIDE IN NACL 20-0.9 MEQ/L-% IV SOLN
INTRAVENOUS | Status: DC
  Administered 2018-03-12: 11:00:00 via INTRAVENOUS
  Filled 2018-03-12: qty 1000

## 2018-03-12 MED ORDER — MIRTAZAPINE 15 MG PO TABS
15.0000 mg | ORAL_TABLET | Freq: Every day | ORAL | Status: DC
  Administered 2018-03-12: 15 mg via ORAL
  Filled 2018-03-12: qty 1

## 2018-03-12 MED ORDER — NICOTINE 14 MG/24HR TD PT24
14.0000 mg | MEDICATED_PATCH | Freq: Every day | TRANSDERMAL | Status: DC
  Administered 2018-03-12 – 2018-03-13 (×2): 14 mg via TRANSDERMAL
  Filled 2018-03-12 (×2): qty 1

## 2018-03-12 MED ORDER — SODIUM CHLORIDE 0.9 % IV SOLN: 250.0000 mL | INTRAVENOUS | Status: DC | PRN

## 2018-03-12 MED ORDER — TRAZODONE HCL 50 MG PO TABS
50.0000 mg | ORAL_TABLET | Freq: Every evening | ORAL | Status: DC | PRN
  Filled 2018-03-12: qty 1

## 2018-03-12 MED ORDER — DOXAZOSIN MESYLATE 2 MG PO TABS
4.0000 mg | ORAL_TABLET | Freq: Every morning | ORAL | Status: DC
  Administered 2018-03-12 – 2018-03-13 (×2): 4 mg via ORAL
  Filled 2018-03-12 (×2): qty 2

## 2018-03-12 MED ORDER — ONDANSETRON HCL 4 MG PO TABS: 4.0000 mg | ORAL_TABLET | Freq: Four times a day (QID) | ORAL | Status: DC | PRN

## 2018-03-12 MED ORDER — POTASSIUM CHLORIDE CRYS ER 20 MEQ PO TBCR
40.0000 meq | EXTENDED_RELEASE_TABLET | Freq: Once | ORAL | Status: AC
  Administered 2018-03-12: 40 meq via ORAL
  Filled 2018-03-12: qty 2

## 2018-03-12 MED ORDER — LORAZEPAM 0.5 MG PO TABS: 0.5000 mg | ORAL_TABLET | Freq: Three times a day (TID) | ORAL | Status: DC | PRN

## 2018-03-12 MED ORDER — ONDANSETRON HCL 4 MG/2ML IJ SOLN: 4.0000 mg | Freq: Four times a day (QID) | INTRAMUSCULAR | Status: DC | PRN

## 2018-03-12 MED ORDER — INFLUENZA VAC SPLIT HIGH-DOSE 0.5 ML IM SUSY: 0.5000 mL | PREFILLED_SYRINGE | INTRAMUSCULAR | Status: DC

## 2018-03-12 MED ORDER — SODIUM CHLORIDE 0.9 % IV BOLUS
1000.0000 mL | Freq: Once | INTRAVENOUS | Status: AC
  Administered 2018-03-12: 1000 mL via INTRAVENOUS

## 2018-03-12 MED ORDER — IPRATROPIUM-ALBUTEROL 0.5-2.5 (3) MG/3ML IN SOLN
3.0000 mL | Freq: Once | RESPIRATORY_TRACT | Status: AC
  Administered 2018-03-12: 3 mL via RESPIRATORY_TRACT
  Filled 2018-03-12: qty 3

## 2018-03-12 MED ORDER — PREDNISONE 50 MG PO TABS
60.0000 mg | ORAL_TABLET | Freq: Once | ORAL | Status: AC
  Administered 2018-03-12: 60 mg via ORAL
  Filled 2018-03-12: qty 1

## 2018-03-12 MED ORDER — IPRATROPIUM-ALBUTEROL 0.5-2.5 (3) MG/3ML IN SOLN
3.0000 mL | Freq: Three times a day (TID) | RESPIRATORY_TRACT | Status: DC
  Administered 2018-03-12 – 2018-03-13 (×4): 3 mL via RESPIRATORY_TRACT
  Filled 2018-03-12 (×4): qty 3

## 2018-03-12 MED ORDER — ATORVASTATIN CALCIUM 40 MG PO TABS
40.0000 mg | ORAL_TABLET | Freq: Every day | ORAL | Status: DC
  Administered 2018-03-12 – 2018-03-13 (×2): 40 mg via ORAL
  Filled 2018-03-12 (×2): qty 1

## 2018-03-12 MED ORDER — ACETAMINOPHEN 325 MG PO TABS
650.0000 mg | ORAL_TABLET | Freq: Four times a day (QID) | ORAL | Status: DC | PRN
  Administered 2018-03-13: 650 mg via ORAL
  Filled 2018-03-12: qty 2

## 2018-03-12 MED ORDER — DOXYCYCLINE HYCLATE 100 MG PO TABS
100.0000 mg | ORAL_TABLET | Freq: Two times a day (BID) | ORAL | Status: DC
  Administered 2018-03-12 – 2018-03-13 (×3): 100 mg via ORAL
  Filled 2018-03-12 (×3): qty 1

## 2018-03-12 MED ORDER — SODIUM CHLORIDE 0.9% FLUSH: 3.0000 mL | INTRAVENOUS | Status: DC | PRN

## 2018-03-12 MED ORDER — POLYETHYLENE GLYCOL 3350 17 G PO PACK: 17.0000 g | PACK | Freq: Every day | ORAL | Status: DC | PRN

## 2018-03-12 MED ORDER — HEPARIN SODIUM (PORCINE) 5000 UNIT/ML IJ SOLN
5000.0000 [IU] | Freq: Three times a day (TID) | INTRAMUSCULAR | Status: DC
  Administered 2018-03-12 – 2018-03-13 (×4): 5000 [IU] via SUBCUTANEOUS
  Filled 2018-03-12 (×4): qty 1

## 2018-03-12 MED ORDER — GUAIFENESIN ER 600 MG PO TB12
600.0000 mg | ORAL_TABLET | Freq: Two times a day (BID) | ORAL | Status: DC
  Administered 2018-03-12 – 2018-03-13 (×3): 600 mg via ORAL
  Filled 2018-03-12 (×3): qty 1

## 2018-03-12 MED ORDER — METHYLPREDNISOLONE SODIUM SUCC 40 MG IJ SOLR
40.0000 mg | Freq: Two times a day (BID) | INTRAMUSCULAR | Status: DC
  Administered 2018-03-12 – 2018-03-13 (×3): 40 mg via INTRAVENOUS
  Filled 2018-03-12 (×3): qty 1

## 2018-03-12 MED ORDER — ALBUTEROL SULFATE (2.5 MG/3ML) 0.083% IN NEBU: 2.5000 mg | INHALATION_SOLUTION | RESPIRATORY_TRACT | Status: DC | PRN

## 2018-03-12 MED ORDER — SODIUM CHLORIDE 0.9% FLUSH
3.0000 mL | Freq: Two times a day (BID) | INTRAVENOUS | Status: DC
  Administered 2018-03-13: 3 mL via INTRAVENOUS

## 2018-03-12 MED ORDER — MOMETASONE FURO-FORMOTEROL FUM 200-5 MCG/ACT IN AERO
2.0000 | INHALATION_SPRAY | Freq: Two times a day (BID) | RESPIRATORY_TRACT | Status: DC
  Administered 2018-03-13: 2 via RESPIRATORY_TRACT
  Filled 2018-03-12: qty 8.8

## 2018-03-12 MED ORDER — AMLODIPINE BESYLATE 5 MG PO TABS
5.0000 mg | ORAL_TABLET | Freq: Every day | ORAL | Status: DC
  Administered 2018-03-12: 5 mg via ORAL
  Filled 2018-03-12 (×2): qty 1

## 2018-03-12 MED ORDER — ACETAMINOPHEN 650 MG RE SUPP: 650.0000 mg | Freq: Four times a day (QID) | RECTAL | Status: DC | PRN

## 2018-03-12 NOTE — ED Provider Notes (Signed)
[facility] Emergency Department Provider Note MRN:  [id number]  Arrival date & time: 03/12/18     Chief Complaint   Shortness of Breath   History of Present Illness   Jackie Walls is a 73 y.o. year-old female with a history of COPD, MMM presenting to the ED with chief complaint of shortness of breath.  Several weeks of diarrhea.  2 to 3 days of persistent cough and shortness of breath according to patient.  Poor historian.  Increased shortness of breath due to the cough.  Denies fever, no vomiting, no chest pain, no abdominal pain, no dysuria.  Symptoms are constant, no exacerbating relieving factors, moderate in severity.  Review of Systems  A complete 10 system review of systems was obtained and all systems are negative except as noted in the HPI and PMH.   Patient's Health History    Past Medical History:  Diagnosis Date  . Abdominal aortic aneurysm ([place])   . ALLERGIC RHINITIS   . Anxiety   . Bronchitis   . Chronic diarrhea   . COPD (chronic obstructive pulmonary disease) ([place])   . Duodenal ulcer   . Hyperlipidemia   . Hypertension   . IDA (iron deficiency anemia)   . Multiple gastric ulcers   . Thoracic aortic aneurysm [facility])     Past Surgical History:  Procedure Laterality Date  . BIOPSY  04/02/2017   Procedure: BIOPSY;  Surgeon: Danie Binder, MD;  Location: [facility] ENDO SUITE;  Service: Endoscopy;;  gastric and gastric ulcer  . CAROTID ENDARTERECTOMY Right   . CESAREAN SECTION    . COLONOSCOPY N/A 08/13/2013   IEP:PIRJJOA polyps-removed as described above. Subtlyabnormal colonic mucosa-status post segmental biopsy.  EGD not  . ESOPHAGOGASTRODUODENOSCOPY N/A 05/15/2013   Dr. Oneida Alar: moderate erosive gastritis, duodenal ulcers, visible vessel with active oozing s/p bleeding control therapy, NSAID induced gastritis  . ESOPHAGOGASTRODUODENOSCOPY N/A 08/13/2013   RMR: EGD not needed and not done  . ESOPHAGOGASTRODUODENOSCOPY  02/23/2017   [facility]: arge 4  cm, deep, cratered ulcer along the lesser curvature of the stomach but no signs of active bleeding. H. pylori stool antigen is negative.   . ESOPHAGOGASTRODUODENOSCOPY N/A 04/02/2017   Dr. Oneida Alar: normal esophagus, large non-bleeding cratered gastric ulcer, normal duodenum, secondary to aspirin   . ESOPHAGOGASTRODUODENOSCOPY N/A 07/18/2017   Procedure: ESOPHAGOGASTRODUODENOSCOPY (EGD);  Surgeon: Danie Binder, MD;  Location: [facility] ENDO SUITE;  Service: Endoscopy;  Laterality: N/A;  . GASTRORRHAPHY N/A 04/25/2013   Procedure: GASTRORRHAPHY;  Surgeon: Jamesetta So, MD;  Location: [facility] ORS;  Service: General;  Laterality: N/A;  . LAPAROTOMY N/A 04/25/2013   Procedure: EXPLORATORY LAPAROTOMY;  Surgeon: Jamesetta So, MD;  Location: [facility] ORS;  Service: General;  Laterality: N/A;    Family History  Problem Relation Age of Onset  . Cancer Sister   . Colon cancer Neg Hx     Social History   Socioeconomic History  . Marital status: Widowed    Spouse name: Not on file  . Number of children: Not on file  . Years of education: Not on file  . Highest education level: Not on file  Occupational History  . Not on file  Social Needs  . Financial resource strain: Not on file  . Food insecurity:    Worry: Not on file    Inability: Not on file  . Transportation needs:    Medical: Not on file    Non-medical: Not on file  Tobacco Use  .  Smoking status: Current Every Day Smoker    Packs/day: 0.25    Types: Cigarettes    Start date: 01/05/1948  . Smokeless tobacco: Never Used  Substance and Sexual Activity  . Alcohol use: No  . Drug use: No  . Sexual activity: Not on file  Lifestyle  . Physical activity:    Days per week: Not on file    Minutes per session: Not on file  . Stress: Not on file  Relationships  . Social connections:    Talks on phone: Not on file    Gets together: Not on file    Attends religious service: Not on file    Active member of club or organization: Not on file    Attends  meetings of clubs or organizations: Not on file    Relationship status: Not on file  . Intimate partner violence:    Fear of current or ex partner: Not on file    Emotionally abused: Not on file    Physically abused: Not on file    Forced sexual activity: Not on file  Other Topics Concern  . Not on file  Social History Narrative   Lives with her husband at home.     Physical Exam  Vital Signs and Nursing Notes reviewed Vitals:   03/12/18 1300 03/12/18 1436  BP: 106/65 132/72  Pulse: 75 81  Resp: 20 20  Temp:  98.2 F (36.8 C)  SpO2: 90% 97%    CONSTITUTIONAL: Chronically ill-appearing, NAD NEURO:  Alert and oriented x 3, no focal deficits EYES:  eyes equal and reactive ENT/NECK:  no LAD, no JVD CARDIO: Regular rate, well-perfused, normal S1 and S2 PULM: No wheezing, scattered rhonchi GI/GU:  normal bowel sounds, non-distended, non-tender MSK/SPINE:  No gross deformities, no edema SKIN:  no rash, atraumatic PSYCH:  Appropriate speech and behavior  Diagnostic and Interventional Summary    EKG Interpretation  Date/Time:  Sunday March 12 2018 07:09:12 EDT Ventricular Rate:  96 PR Interval:    QRS Duration: 94 QT Interval:  412 QTC Calculation: 521 R Axis:   23 Text Interpretation:  Sinus rhythm Short PR interval Probable left atrial enlargement Probable left ventricular hypertrophy Anterior Q waves, possibly due to LVH Prolonged QT interval Confirmed by Gerlene Fee [phone]) on 03/12/2018 7:15:05 AM      Labs Reviewed  CBC - Abnormal; Notable for the following components:      Result Value   WBC 13.4 (*)    Hemoglobin 11.0 (*)    HCT 35.5 (*)    All other components within normal limits  COMPREHENSIVE METABOLIC PANEL - Abnormal; Notable for the following components:   Potassium 3.1 (*)    Glucose, Bld 134 (*)    BUN 26 (*)    Creatinine, Ser 1.06 (*)    Calcium 8.8 (*)    Albumin 3.2 (*)    AST 14 (*)    GFR calc non Af Amer 52 (*)    All other components  within normal limits  C DIFFICILE QUICK SCREEN W PCR REFLEX  LACTIC ACID, PLASMA  INFLUENZA PANEL BY PCR (TYPE A & B)  I-STAT TROPONIN, ED    DG Chest 2 View  Final Result      Medications  0.9 % NaCl with KCl 20 mEq/ L  infusion ( Intravenous New Bag/Given 03/12/18 1038)  amLODipine (NORVASC) tablet 5 mg (5 mg Oral Given 03/12/18 1540)  atorvastatin (LIPITOR) tablet 40 mg (40 mg Oral Given 03/12/18  1540)  doxazosin (CARDURA) tablet 4 mg (4 mg Oral Given 03/12/18 1540)  LORazepam (ATIVAN) tablet 0.5 mg (has no administration in time range)  mirtazapine (REMERON) tablet 15 mg (has no administration in time range)  nicotine (NICODERM CQ - dosed in mg/24 hours) patch 14 mg (14 mg Transdermal Patch Applied 03/12/18 1540)  mometasone-formoterol (DULERA) 200-5 MCG/ACT inhaler 2 puff (has no administration in time range)  sodium chloride flush (NS) 0.9 % injection 3 mL (has no administration in time range)  sodium chloride flush (NS) 0.9 % injection 3 mL (has no administration in time range)  0.9 %  sodium chloride infusion (has no administration in time range)  acetaminophen (TYLENOL) tablet 650 mg (has no administration in time range)    Or  acetaminophen (TYLENOL) suppository 650 mg (has no administration in time range)  traZODone (DESYREL) tablet 50 mg (has no administration in time range)  polyethylene glycol (MIRALAX / GLYCOLAX) packet 17 g (has no administration in time range)  ondansetron (ZOFRAN) tablet 4 mg (has no administration in time range)    Or  ondansetron (ZOFRAN) injection 4 mg (has no administration in time range)  albuterol (PROVENTIL) (2.5 MG/3ML) 0.083% nebulizer solution 2.5 mg (has no administration in time range)  heparin injection 5,000 Units (5,000 Units Subcutaneous Given 03/12/18 1540)  ipratropium-albuterol (DUONEB) 0.5-2.5 (3) MG/3ML nebulizer solution 3 mL (has no administration in time range)  guaiFENesin (MUCINEX) 12 hr tablet 600 mg (600 mg Oral Given 03/12/18 1039)    doxycycline (VIBRA-TABS) tablet 100 mg (100 mg Oral Given 03/12/18 1039)  methylPREDNISolone sodium succinate (SOLU-MEDROL) 40 mg/mL injection 40 mg (40 mg Intravenous Given 03/12/18 1039)  Influenza vac split quadrivalent PF (FLUZONE HIGH-DOSE) injection 0.5 mL (has no administration in time range)  ipratropium-albuterol (DUONEB) 0.5-2.5 (3) MG/3ML nebulizer solution 3 mL (3 mLs Nebulization Given 03/12/18 0808)  predniSONE (DELTASONE) tablet 60 mg (60 mg Oral Given 03/12/18 0903)  sodium chloride 0.9 % bolus 1,000 mL (0 mLs Intravenous Stopped 03/12/18 1039)  potassium chloride SA (K-DUR,KLOR-CON) CR tablet 40 mEq (40 mEq Oral Given 03/12/18 1039)     Procedures Critical Care  ED Course and Medical Decision Making  I have reviewed the triage vital signs and the nursing notes.  Pertinent labs & imaging results that were available during my care of the patient were reviewed by me and considered in my medical decision making (see below for details).  Pneumonia versus COPD exacerbation, unclear etiology of chronic diarrhea, will screen for metabolic disarray with labs.  Labs reveal mild AKI, family not at bedside able to tell us more history.  Patient has been largely failing to thrive for the past 2 days, not getting out of bed, not eating, diarrhea all night, does not seem like she would be able to keep up with her fluid losses.  Admitted to hospital service for further care.  Barth Kirks. Sedonia Small, MD [place] [email or web address]  Final Clinical Impressions(s) / ED Diagnoses     ICD-10-CM   1. Dehydration E86.0   2. Cough R05 DG Chest 2 View    DG Chest 2 View    ED Discharge Orders    None         Maudie Flakes, MD 03/12/18 [id number]

## 2018-03-12 NOTE — ED Triage Notes (Signed)
Pt brought in from home by ems for evaluation of shortness of breath and diarrhea.  Was recently in nursing home and has had diarrhea since being released.  Has developed a bad cough since then.  Is usually on 2 L of oxygen and sats were low 90s with ems.

## 2018-03-12 NOTE — H&P (Signed)
Patient Demographics:    Jackie Walls, is a 73 y.o. female  MRN: [id number]   DOB - Jan 31, 1945  Admit Date - 03/12/2018  Outpatient Primary MD for the patient is Dione Housekeeper, MD   Assessment & Plan:    Principal Problem:   COPD with acute exacerbation ([place]) Active Problems:   HTN (hypertension)   Protein-calorie malnutrition, severe ([place])   Diarrhea    Plan:- 1)Acute COPD Exacerbation- no definite pneumonia,  treat empirically with IV Solu-Medrol 40 mg every 12 hours, give mucolytics, doxycycline and bronchodilators as ordered, supplemental oxygen as ordered.  Influenza test is negative, smoking cessation advised  2)Diarrhea--- patient with WBC of 13.4, patient with history of on and off diarrhea worse over the last few days, recent hospitalization, recent stay at [facility] rehab, check stool for C. Difficile, if stool for C. difficile negative may treat empirically with Imodium or Lomotil  3)AKI----acute kidney injury due to Diarrhea with poor oral intake, creatinine is up to 1.0 from a baseline of 0.5   creatinine on admission= 1.06  ,   baseline creatinine = 0.56   , renally adjust medications, avoid nephrotoxic agents/dehydration/hypotension, hold losartan HCTZ, hydrate IV and orally  4)HTN--- stable, continue amlodipine 5 mg daily, hold losartan/HCTZ as above #3 due to AKI  5) generalized weakness/debility----request PT eval  6)FTT/severe protein caloric malnutrition--- continue Remeron 15 mg nightly for appetite stimulation , get dietitian consult, encourage supplements   With History of - Reviewed by me  Past Medical History:  Diagnosis Date  . Abdominal aortic aneurysm ([place])   . ALLERGIC RHINITIS   . Anxiety   . Bronchitis   . Chronic diarrhea   . COPD (chronic obstructive pulmonary disease)  ([place])   . Duodenal ulcer   . Hyperlipidemia   . Hypertension   . IDA (iron deficiency anemia)   . Multiple gastric ulcers   . Thoracic aortic aneurysm [facility])       Past Surgical History:  Procedure Laterality Date  . BIOPSY  04/02/2017   Procedure: BIOPSY;  Surgeon: Danie Binder, MD;  [facility];  Service: Endoscopy;;  gastric and gastric ulcer  . CAROTID ENDARTERECTOMY Right   . CESAREAN SECTION    . COLONOSCOPY N/A 08/13/2013   OZY:YQMGNOI polyps-removed as described above. Subtlyabnormal colonic mucosa-status post segmental biopsy.  EGD not  . ESOPHAGOGASTRODUODENOSCOPY N/A 05/15/2013   Dr. Oneida Alar: moderate erosive gastritis, duodenal ulcers, visible vessel with active oozing s/p bleeding control therapy, NSAID induced gastritis  . ESOPHAGOGASTRODUODENOSCOPY N/A 08/13/2013   RMR: EGD not needed and not done  . ESOPHAGOGASTRODUODENOSCOPY  02/23/2017   [facility]: arge 4 cm, deep, cratered ulcer along the lesser curvature of the stomach but no signs of active bleeding. H. pylori stool antigen is negative.   . ESOPHAGOGASTRODUODENOSCOPY N/A 04/02/2017   Dr. Oneida Alar: normal esophagus, large non-bleeding cratered gastric ulcer, normal duodenum, secondary to aspirin   . ESOPHAGOGASTRODUODENOSCOPY N/A 07/18/2017  Procedure: ESOPHAGOGASTRODUODENOSCOPY (EGD);  Surgeon: Danie Binder, MD;  Location[facility];  Service: Endoscopy;  Laterality: N/A;  . GASTRORRHAPHY N/A 04/25/2013   Procedure: GASTRORRHAPHY;  Surgeon: Jamesetta So, MD;  Location: [facility] ORS;  Service: General;  Laterality: N/A;  . LAPAROTOMY N/A 04/25/2013   Procedure: EXPLORATORY LAPAROTOMY;  Surgeon: Jamesetta So, MD;  Location: [facility] ORS;  Service: General;  Laterality: N/A;      Chief Complaint  Patient presents with  . Shortness of Breath      HPI:    Jackie Walls  is a 73 y.o. female with past medical history of hypertension, on and off diarrhea, COPD and ongoing tobacco use presents to the ED with  concerns of shortness of breath, cough worsening diarrhea, generalized weakness and debility, poor appetite and poor oral intake  Patient is very hard of hearing, she is a very poor historian  Patient apparently was recently discharged from SNF rehab back home  From what I can gather no chest pains, no palpitations, no dizziness, no fevers No dysuria  In ED... Lab remarkable for white count of 13.4 and creatinine was up to 1.0 from prior baseline of 0.56  In ED... Chest x-ray without acute findings, troponin is not elevated, influenza panel is negative    Review of systems:    In addition to the HPI above,   A full Review of  Systems was done, all other systems reviewed are negative except as noted above in HPI , .    Social History:  Reviewed by me    Social History   Tobacco Use  . Smoking status: Current Every Day Smoker    Packs/day: 0.25    Types: Cigarettes    Start date: 01/05/1948  . Smokeless tobacco: Never Used  Substance Use Topics  . Alcohol use: No       Family History :  Reviewed by me    Family History  Problem Relation Age of Onset  . Cancer Sister   . Colon cancer Neg Hx      Home Medications:   Prior to Admission medications   Medication Sig Start Date End Date Taking? Authorizing Provider  acetaminophen (TYLENOL) 325 MG tablet Take 2 tablets (650 mg total) by mouth every 6 (six) hours as needed for mild pain (or Fever >/= 101). 07/19/17  Yes Barton Dubois, MD  albuterol (PROVENTIL) (2.5 MG/3ML) 0.083% nebulizer solution Inhale 3 mLs into the lungs every 4 (four) hours. 02/28/17  Yes [provider]  alendronate (FOSAMAX) 70 MG tablet Take 70 mg by mouth once a week. Take with a full glass of water on an empty stomach. Takes on Sat.   Yes [provider]  amLODipine (NORVASC) 10 MG tablet Take 0.5 tablets (5 mg total) by mouth daily. 07/19/17  Yes Barton Dubois, MD  atorvastatin (LIPITOR) 40 MG tablet Take 1 tablet by mouth  daily. 06/06/17  Yes [provider]  budesonide-formoterol (SYMBICORT) 160-4.5 MCG/ACT inhaler Inhale 2 puffs into the lungs 2 (two) times daily.     Yes [provider]  hydrochlorothiazide (MICROZIDE) 12.5 MG capsule Take 1 capsule by mouth daily. 02/28/17  Yes [provider]  HYDROcodone-acetaminophen (NORCO/VICODIN) 5-325 MG tablet Take 1 tablet by mouth every 6 (six) hours as needed for moderate pain.   Yes [provider]  losartan (COZAAR) 50 MG tablet Take 1 tablet (50 mg total) by mouth every morning. Patient taking differently: Take 100 mg by mouth every  morning.  07/19/17  Yes Barton Dubois, MD  mirtazapine (REMERON) 15 MG tablet Take 1 tablet by mouth at bedtime. 06/06/17  Yes [provider]  pantoprazole (PROTONIX) 40 MG tablet Take 1 tablet (40 mg total) by mouth 2 (two) times daily. 04/04/17  Yes Dhungel, Nishant, MD  potassium chloride SA (K-DUR,KLOR-CON) 20 MEQ tablet Take 1 tablet by mouth daily.   Yes [provider]  umeclidinium bromide (INCRUSE ELLIPTA) 62.5 MCG/INH AEPB Take 1 Inhaler by mouth daily. 07/21/17  Yes [provider]  Vitamin D, Ergocalciferol, (DRISDOL) 1.25 MG (50000 UT) CAPS capsule Take 50,000 Units by mouth every 7 (seven) days.   Yes [provider]  clopidogrel (PLAVIX) 75 MG tablet Take 1 tablet (75 mg total) by mouth daily. 04/04/17   Fields, Marga Melnick, MD  doxazosin (CARDURA) 4 MG tablet Take 4 mg by mouth every morning.     [provider]  LORazepam (ATIVAN) 0.5 MG tablet Take 1 tablet by mouth every 8 (eight) hours as needed for anxiety.  02/28/17   [provider]  nicotine (NICODERM CQ - DOSED IN MG/24 HOURS) 14 mg/24hr patch Place 1 patch (14 mg total) onto the skin daily. 07/20/17   Barton Dubois, MD     Allergies:     Allergies  Allergen Reactions  . Ibuprofen Other (See Comments)    History of gastric ulcers.     Physical Exam:   Vitals  Blood pressure  132/72, pulse 81, temperature 98.2 F (36.8 C), temperature source Oral, resp. rate 20, height 5\' 5"  (1.651 m), weight 49.2 kg, SpO2 96 %.  Physical Examination: General appearance - alert, frail and chronically ill-appearing  mental status - alert, oriented to person,  Nose- [place] 2 L/min Ears--- very HOH Eyes - sclera anicteric Neck - supple, no JVD elevation , Chest - Diminished in bases, scattered wheezes, no rhonchi no rales Heart - S1 and S2 normal, regular  Abdomen - soft, nontender, nondistended, no masses or organomegaly Neurological - screening mental status exam normal, neck supple without rigidity, cranial nerves II through XII intact, DTR's normal and symmetric Extremities - no pedal edema noted, intact peripheral pulses  Skin - warm, dry     Data Review:    CBC Recent Labs  Lab 03/12/18 0756  WBC 13.4*  HGB 11.0*  HCT 35.5*  PLT 368  MCV 87.4  MCH 27.1  MCHC 31.0  RDW 14.8   ------------------------------------------------------------------------------------------------------------------  Chemistries  Recent Labs  Lab 03/12/18 0756  NA 136  K 3.1*  CL 100  CO2 25  GLUCOSE 134*  BUN 26*  CREATININE 1.06*  CALCIUM 8.8*  AST 14*  ALT 10  ALKPHOS 105  BILITOT 0.3   ------------------------------------------------------------------------------------------------------------------ estimated creatinine clearance is 37.3 mL/min (A) (by C-G formula based on SCr of 1.06 mg/dL (H)). ------------------------------------------------------------------------------------------------------------------ No results for input(s): TSH, T4TOTAL, T3FREE, THYROIDAB in the last 72 hours.  Invalid input(s): FREET3   Coagulation profile No results for input(s): INR, PROTIME in the last 168 hours. ------------------------------------------------------------------------------------------------------------------- No results for input(s): DDIMER in the last 72  hours. -------------------------------------------------------------------------------------------------------------------  Cardiac Enzymes No results for input(s): CKMB, TROPONINI, MYOGLOBIN in the last 168 hours.  Invalid input(s): CK ------------------------------------------------------------------------------------------------------------------ No results found for: BNP   ---------------------------------------------------------------------------------------------------------------  Urinalysis    Component Value Date/Time   COLORURINE YELLOW 04/01/2017 [place] 04/01/2017 1141   LABSPEC 1.006 04/01/2017 1141   PHURINE 7.0 04/01/2017 1141   GLUCOSEU NEGATIVE 04/01/2017 1141  HGBUR NEGATIVE 04/01/2017 1141   BILIRUBINUR NEGATIVE 04/01/2017 1141   KETONESUR NEGATIVE 04/01/2017 1141   PROTEINUR NEGATIVE 04/01/2017 1141   UROBILINOGEN 0.2 05/13/2013 1304   NITRITE NEGATIVE 04/01/2017 1141   LEUKOCYTESUR TRACE (A) 04/01/2017 1141    ----------------------------------------------------------------------------------------------------------------   Imaging Results:    Dg Chest 2 View  Result Date: 03/12/2018 CLINICAL DATA:  Cough EXAM: CHEST - 2 VIEW COMPARISON:  07/17/2017 FINDINGS: Cardiac shadows within normal limits. The lungs are well aerated bilaterally. Biapical scarring is again identified and stable. No focal infiltrate or sizable effusion is seen. No acute bony abnormality is seen. IMPRESSION: Chronic changes without acute abnormality. Electronically Signed   By: Inez Catalina M.D.   On: 03/12/2018 07:52    Radiological Exams on Admission: Dg Chest 2 View  Result Date: 03/12/2018 CLINICAL DATA:  Cough EXAM: CHEST - 2 VIEW COMPARISON:  07/17/2017 FINDINGS: Cardiac shadows within normal limits. The lungs are well aerated bilaterally. Biapical scarring is again identified and stable. No focal infiltrate or sizable effusion is seen. No acute bony abnormality  is seen. IMPRESSION: Chronic changes without acute abnormality. Electronically Signed   By: Inez Catalina M.D.   On: 03/12/2018 07:52    DVT Prophylaxis -SCD   AM Labs Ordered, also please review Full Orders  Family Communication: Admission, patients condition and plan of care including tests being ordered have been discussed with the patient who indicate understanding and agree with the plan   Code Status - Full Code  Likely DC to  TBD  Condition   stable*  Roxan Hockey M.D on 03/12/2018 at 5:30 PM Go to [email or web address] -  for contact info  [facility] - Office  [phone]

## 2018-03-13 DIAGNOSIS — J441 Chronic obstructive pulmonary disease with (acute) exacerbation: Secondary | ICD-10-CM | POA: Diagnosis not present

## 2018-03-13 LAB — C DIFFICILE QUICK SCREEN W PCR REFLEX
C Diff antigen: NEGATIVE
C Diff interpretation: NOT DETECTED
C Diff toxin: NEGATIVE

## 2018-03-13 LAB — CBC
HCT: 30.1 % — ABNORMAL LOW (ref 36.0–46.0)
Hemoglobin: 9.1 g/dL — ABNORMAL LOW (ref 12.0–15.0)
MCH: 27 pg (ref 26.0–34.0)
MCHC: 30.2 g/dL (ref 30.0–36.0)
MCV: 89.3 fL (ref 80.0–100.0)
Platelets: 352 10*3/uL (ref 150–400)
RBC: 3.37 MIL/uL — ABNORMAL LOW (ref 3.87–5.11)
RDW: 15 % (ref 11.5–15.5)
WBC: 7.9 10*3/uL (ref 4.0–10.5)
nRBC: 0 % (ref 0.0–0.2)

## 2018-03-13 LAB — BASIC METABOLIC PANEL
Anion gap: 6 (ref 5–15)
BUN: 20 mg/dL (ref 8–23)
CO2: 23 mmol/L (ref 22–32)
Calcium: 7.9 mg/dL — ABNORMAL LOW (ref 8.9–10.3)
Chloride: 111 mmol/L (ref 98–111)
Creatinine, Ser: 0.67 mg/dL (ref 0.44–1.00)
GFR calc Af Amer: >60 mL/min (ref >60)
Glucose, Bld: 127 mg/dL — ABNORMAL HIGH (ref 70–99)
Potassium: 4.9 mmol/L (ref 3.5–5.1)
Sodium: 140 mmol/L (ref 135–145)

## 2018-03-13 MED ORDER — LOPERAMIDE HCL 2 MG PO TABS: 2.0000 mg | ORAL_TABLET | Freq: Three times a day (TID) | ORAL | 0 refills | Status: DC | PRN

## 2018-03-13 MED ORDER — AMLODIPINE BESYLATE 5 MG PO TABS: 5.0000 mg | ORAL_TABLET | Freq: Every day | ORAL | 1 refills | Status: AC

## 2018-03-13 MED ORDER — GUAIFENESIN ER 600 MG PO TB12: 600.0000 mg | ORAL_TABLET | Freq: Two times a day (BID) | ORAL | 0 refills | Status: DC

## 2018-03-13 MED ORDER — DOXYCYCLINE HYCLATE 100 MG PO TABS: 100.0000 mg | ORAL_TABLET | Freq: Two times a day (BID) | ORAL | 0 refills | Status: AC

## 2018-03-13 MED ORDER — LOSARTAN POTASSIUM 50 MG PO TABS: 50.0000 mg | ORAL_TABLET | Freq: Every day | ORAL | 2 refills | Status: AC

## 2018-03-13 MED ORDER — PREDNISONE 20 MG PO TABS
50.0000 mg | ORAL_TABLET | Freq: Once | ORAL | Status: AC
  Administered 2018-03-13: 50 mg via ORAL
  Filled 2018-03-13: qty 1

## 2018-03-13 MED ORDER — PREDNISONE 20 MG PO TABS: 20.0000 mg | ORAL_TABLET | Freq: Every day | ORAL | 0 refills | Status: DC

## 2018-03-13 NOTE — Care Management Note (Addendum)
Case Management Note  Patient Details  Name: Jackie Walls MRN: [id number] Date of Birth: 05-16-1945  Subjective/Objective:        Patient discharged from [facility] three weeks ago and will be residing with her daughter, Luna Fuse at [address], [address], [place], [place] [address]. Patient was in SNF for 6-7 months.  In regards to DME patient states that she has a walker which she mostly uses and a wheelchair that she uses occasionally. She has a shower chair as well. Patient stated that she has a bedside commode in her home and if she needed it she could get it. Patient hopes to one day be well enough to live in her own home again.      Daughter, Lattie Haw, advised of patient's discharge and indicated that she will transport patient home.    Action/Plan:  RN, PT, Aide ordered. Patient referred to [facility]. Referral given to [facility] with Alvis Lemmings.  Expected Discharge Date:  03/13/18               Expected Discharge Plan:  [place]  In-House Referral:     Discharge planning Services  CM Consult  Post Acute Care Choice:  Home Health Choice offered to:  Patient  DME Arranged:    DME Agency:     HH Arranged:  RN, PT, Nurse's Aide [place] Agency:  [facility]  Status of Service:  Completed, signed off  If discussed at [place] of Stay Meetings, dates discussed:    Additional Comments:  Ihor Gully, LCSW 03/13/2018, 2:34 PM

## 2018-03-13 NOTE — Discharge Summary (Signed)
Jackie Walls, is a 73 y.o. female  DOB 1945/10/03  MRN [id number].  Admission date:  03/12/2018  Admitting Physician  Roxan Hockey, MD  Discharge Date:  03/13/2018   Primary MD  Dione Housekeeper, MD  Recommendations for primary care physician for things to follow:   1)Take  medications as prescribed 2) Avoid Tobacco smoke exposure 3)Follow up with Primary care physician within a week for Recheck    Admission Diagnosis  Cough [R05]   Discharge Diagnosis  Cough [R05]    Principal Problem:   COPD with acute exacerbation ([place]) Active Problems:   HTN (hypertension)   Protein-calorie malnutrition, severe ([place])   Diarrhea      Past Medical History:  Diagnosis Date  . Abdominal aortic aneurysm ([place])   . ALLERGIC RHINITIS   . Anxiety   . Bronchitis   . Chronic diarrhea   . COPD (chronic obstructive pulmonary disease) ([place])   . Duodenal ulcer   . Hyperlipidemia   . Hypertension   . IDA (iron deficiency anemia)   . Multiple gastric ulcers   . Thoracic aortic aneurysm [facility])     Past Surgical History:  Procedure Laterality Date  . BIOPSY  04/02/2017   Procedure: BIOPSY;  Surgeon: Danie Binder, MD;  [facility];  Service: Endoscopy;;  gastric and gastric ulcer  . CAROTID ENDARTERECTOMY Right   . CESAREAN SECTION    . COLONOSCOPY N/A 08/13/2013   HUT:MLYYTKP polyps-removed as described above. Subtlyabnormal colonic mucosa-status post segmental biopsy.  EGD not  . ESOPHAGOGASTRODUODENOSCOPY N/A 05/15/2013   Dr. Oneida Alar: moderate erosive gastritis, duodenal ulcers, visible vessel with active oozing s/p bleeding control therapy, NSAID induced gastritis  . ESOPHAGOGASTRODUODENOSCOPY N/A 08/13/2013   RMR: EGD not needed and not done  . ESOPHAGOGASTRODUODENOSCOPY  02/23/2017   [facility]: arge 4 cm, deep, cratered ulcer along the lesser curvature of the stomach but no signs of active  bleeding. H. pylori stool antigen is negative.   . ESOPHAGOGASTRODUODENOSCOPY N/A 04/02/2017   Dr. Oneida Alar: normal esophagus, large non-bleeding cratered gastric ulcer, normal duodenum, secondary to aspirin   . ESOPHAGOGASTRODUODENOSCOPY N/A 07/18/2017   Procedure: ESOPHAGOGASTRODUODENOSCOPY (EGD);  Surgeon: Danie Binder, MD;  [facility];  Service: Endoscopy;  Laterality: N/A;  . GASTRORRHAPHY N/A 04/25/2013   Procedure: GASTRORRHAPHY;  Surgeon: Jamesetta So, MD;  Location: [facility] ORS;  Service: General;  Laterality: N/A;  . LAPAROTOMY N/A 04/25/2013   Procedure: EXPLORATORY LAPAROTOMY;  Surgeon: Jamesetta So, MD;  Location: [facility] ORS;  Service: General;  Laterality: N/A;     HPI  from the history and physical done on the day of admission:     Jackie Walls  is a 73 y.o. female with past medical history of hypertension, on and off diarrhea, COPD and ongoing tobacco use presents to the ED with concerns of shortness of breath, cough worsening diarrhea, generalized weakness and debility, poor appetite and poor oral intake  Patient is very hard of hearing, she is a very poor historian  Patient apparently was recently discharged from SNF rehab back home  From what I can gather no chest pains, no palpitations, no dizziness, no fevers No dysuria  In ED... Lab remarkable for white count of 13.4 and creatinine was up to 1.0 from prior baseline of 0.56  In ED... Chest x-ray without acute findings, troponin is not elevated, influenza panel is negative    Hospital Course:     Plan:- 1)Acute COPD Exacerbation- no definite pneumonia,   improved with IV Solu-Medrol , mucolytics, doxycycline and bronchodilators as ordered, continue supplemental oxygen,   Influenza test is negative, smoking cessation advised  2)Diarrhea---   WBC is down to 7.9 from  13.4, patient with history of on and off diarrhea worse over the last few days, recent hospitalization, recent stay at [facility] rehab, stool for  C. Difficile is negative, may treat empirically with Imodium or Lomotil  3)AKI----acute kidney injury due to Diarrhea with poor oral intake, creatinine is down to 0.6 from  1.0 ,  baseline creatinine = 0.56   , renally adjust medications, avoid nephrotoxic agents/dehydration/hypotension, stop HCTZ  Due to history of intermittent diarrhea with risk for dehydration  4)HTN--- stable, continue amlodipine 5 mg daily, stop HCTZ as above #3 due to AKI, continue losartan as renal function is recovering  5)Generalized weakness/debility----request PT eval  6)FTT/severe protein caloric malnutrition--- continue Remeron 15 mg nightly for appetite stimulation , encourage supplements  Discharge Condition: stable  Follow UP-- PCP   Diet and Activity recommendation:  As advised  Discharge Instructions    Discharge Instructions    Call MD for:  difficulty breathing, headache or visual disturbances   Complete by:  As directed    Call MD for:  persistant dizziness or light-headedness   Complete by:  As directed    Call MD for:  persistant nausea and vomiting   Complete by:  As directed    Call MD for:  severe uncontrolled pain   Complete by:  As directed    Call MD for:  temperature >100.4   Complete by:  As directed    Diet - low sodium heart healthy   Complete by:  As directed    Discharge instructions   Complete by:  As directed    1)Take  medications as prescribed 2) Avoid Tobacco smoke exposure 3)Follow up with Primary care physician within a week for Recheck   Increase activity slowly   Complete by:  As directed         Discharge Medications     Allergies as of 03/13/2018      Reactions   Ibuprofen Other (See Comments)   History of gastric ulcers.      Medication List    STOP taking these medications   hydrochlorothiazide 12.5 MG capsule Commonly known as:  MICROZIDE   LORazepam 0.5 MG tablet Commonly known as:  ATIVAN     TAKE these medications   acetaminophen 325  MG tablet Commonly known as:  TYLENOL Take 2 tablets (650 mg total) by mouth every 6 (six) hours as needed for mild pain (or Fever >/= 101).   albuterol (2.5 MG/3ML) 0.083% nebulizer solution Commonly known as:  PROVENTIL Inhale 3 mLs into the lungs every 4 (four) hours.   alendronate 70 MG tablet Commonly known as:  FOSAMAX Take 70 mg by mouth once a week. Take with a full glass of water on an empty stomach. Takes on Sat.   amLODipine 5 MG tablet Commonly known as:  NORVASC  Take 1 tablet (5 mg total) by mouth daily. What changed:  medication strength   atorvastatin 40 MG tablet Commonly known as:  LIPITOR Take 1 tablet by mouth daily.   budesonide-formoterol 160-4.5 MCG/ACT inhaler Commonly known as:  SYMBICORT Inhale 2 puffs into the lungs 2 (two) times daily.   clopidogrel 75 MG tablet Commonly known as:  PLAVIX Take 1 tablet (75 mg total) by mouth daily.   doxazosin 4 MG tablet Commonly known as:  CARDURA Take 4 mg by mouth every morning.   doxycycline 100 MG tablet Commonly known as:  VIBRA-TABS Take 1 tablet (100 mg total) by mouth 2 (two) times daily for 5 days.   guaiFENesin 600 MG 12 hr tablet Commonly known as:  MUCINEX Take 1 tablet (600 mg total) by mouth 2 (two) times daily.   HYDROcodone-acetaminophen 5-325 MG tablet Commonly known as:  NORCO/VICODIN Take 1 tablet by mouth every 6 (six) hours as needed for moderate pain.   Incruse Ellipta 62.5 MCG/INH Aepb Generic drug:  umeclidinium bromide Take 1 Inhaler by mouth daily.   loperamide 2 MG tablet Commonly known as:  IMODIUM A-D Take 1 tablet (2 mg total) by mouth 3 (three) times daily as needed for diarrhea or loose stools.   losartan 50 MG tablet Commonly known as:  COZAAR Take 1 tablet (50 mg total) by mouth daily. What changed:  when to take this   mirtazapine 15 MG tablet Commonly known as:  REMERON Take 1 tablet by mouth at bedtime.   nicotine 14 mg/24hr patch Commonly known as:   NICODERM CQ - dosed in mg/24 hours Place 1 patch (14 mg total) onto the skin daily.   pantoprazole 40 MG tablet Commonly known as:  PROTONIX Take 1 tablet (40 mg total) by mouth 2 (two) times daily.   potassium chloride SA 20 MEQ tablet Commonly known as:  K-DUR,KLOR-CON Take 1 tablet by mouth daily.   predniSONE 20 MG tablet Commonly known as:  Deltasone Take 1 tablet (20 mg total) by mouth daily with breakfast.   Vitamin D (Ergocalciferol) 1.25 MG (50000 UT) Caps capsule Commonly known as:  DRISDOL Take 50,000 Units by mouth every 7 (seven) days.      Major procedures and Radiology Reports - PLEASE review detailed and final reports for all details, in brief -   Dg Chest 2 View  Result Date: 03/12/2018 CLINICAL DATA:  Cough EXAM: CHEST - 2 VIEW COMPARISON:  07/17/2017 FINDINGS: Cardiac shadows within normal limits. The lungs are well aerated bilaterally. Biapical scarring is again identified and stable. No focal infiltrate or sizable effusion is seen. No acute bony abnormality is seen. IMPRESSION: Chronic changes without acute abnormality. Electronically Signed   By: Inez Catalina M.D.   On: 03/12/2018 07:52   Micro Results   Recent Results (from the past 240 hour(s))  C difficile quick scan w PCR reflex     Status: None   Collection Time: 03/12/18  9:21 AM  Result Value Ref Range Status   C Diff antigen NEGATIVE NEGATIVE Final   C Diff toxin NEGATIVE NEGATIVE Final   C Diff interpretation No C. difficile detected.  Final    Comment: Performed at [facility], [address], [place], [place] [address]    Today   Subjective    Jackie Walls today has no new concerns, diarrhea has improved significantly, cough and wheezing is improved,          Patient has been seen and examined prior  to discharge   Objective   Blood pressure 113/68, pulse 89, temperature 98.5 F (36.9 C), temperature source Oral, resp. rate 20, height 5\' 5"  (1.651 m), weight 49.2 kg, SpO2 95  %.   Intake/Output Summary (Last 24 hours) at 03/13/2018 1335 Last data filed at 03/13/2018 0945 Gross per 24 hour  Intake 806.33 ml  Output 800 ml  Net 6.33 ml    Exam Physical Examination: General appearance - alert, frail and chronically ill-appearing  mental status - alert, oriented to person,  Nose- Stark 2 L/min Ears--- very HOH Eyes - sclera anicteric Neck - supple, no JVD elevation , Chest - Diminished in bases, scattered wheezes, no rhonchi no rales Heart - S1 and S2 normal, regular  Abdomen - soft, nontender, nondistended, no masses or organomegaly Neurological - screening mental status exam normal, neck supple without rigidity, cranial nerves II through XII intact, DTR's normal and symmetric Extremities - no pedal edema noted, intact peripheral pulses  Skin - warm, dry    Data Review   CBC w Diff:  Lab Results  Component Value Date   WBC 7.9 03/13/2018   HGB 9.1 (L) 03/13/2018   HCT 30.1 (L) 03/13/2018   PLT 352 03/13/2018   LYMPHOPCT 13 07/19/2017   MONOPCT 4 07/19/2017   EOSPCT 2 07/19/2017   BASOPCT 1 07/19/2017    CMP:  Lab Results  Component Value Date   NA 140 03/13/2018   K 4.9 03/13/2018   CL 111 03/13/2018   CO2 23 03/13/2018   BUN 20 03/13/2018   CREATININE 0.67 03/13/2018   PROT 7.1 03/12/2018   ALBUMIN 3.2 (L) 03/12/2018   BILITOT 0.3 03/12/2018   ALKPHOS 105 03/12/2018   AST 14 (L) 03/12/2018   ALT 10 03/12/2018  .   Total Discharge time is about 33 minutes  Roxan Hockey M.D on 03/13/2018 at 1:35 PM  Go to [email or web address] -  for contact info  [facility] - Office  [phone]

## 2018-03-13 NOTE — Progress Notes (Signed)
Patient's IV catheter removed and intact. Pt's IV site clean dry and intact. Discharge instructions including medications and follow up appointments were reviewed and discussed with patient's daughter. All questions were answered and no further questions at this time.

## 2018-03-13 NOTE — Progress Notes (Addendum)
Initial Nutrition Assessment  DOCUMENTATION CODES:   Non-severe (moderate) malnutrition in context of chronic illness   Underweight  INTERVENTION:  Continue -Ensure Enlive po BID, each supplement provides 350 kcal and 20 grams of protein   Liberalize therapeutic diet restriction    NUTRITION DIAGNOSIS:   Moderate Malnutrition related to acute illness, chronic illness(acute shortness of breath , chronic COPD and chronic diarrhea) as evidenced by mild fat depletion, mild muscle depletion.   GOAL:   Patient will meet greater than or equal to 90% of their needs  MONITOR:   PO intake, Supplement acceptance, Labs, Weight trends  REASON FOR ASSESSMENT:   Consult Assessment of nutrition requirement/status  ASSESSMENT: Patient is a 73 yo female who presents with shortness of breath and persisting diarrhea (chronic). History of HTN, COPD (home O2 @ 2 L), Iron deficiency anemia, gastric and duodenal ulcers. Tobacco smoker. Patient is very hard of hearing and there are no family present to assist with nutrition history details.   Meal intake -50-100% at this time. Feeds herself. Patient lives with her daughter who shops for food and prepares meals for them. Patient says she likes vegetables and has been drinking 3 Boosts daily with meals. She is taking Remeron which sometimes helps with stimulating appetite.  Patient weight history shows gain of 29% in 8 months from 38-40 kg in July 2019 to current 49 kg. Patient is unable to provide details of weight history and defers to her daughter- who is not present at this time.   Medications reviewed and include: lipitor, cardura, remeron, dulera and nicotine patch.  Labs: BMP Latest Ref Rng & Units 03/13/2018 03/12/2018 07/18/2017  Glucose 70 - 99 mg/dL 127(H) 134(H) 91  BUN 8 - 23 mg/dL 20 26(H) 50(H)  Creatinine 0.44 - 1.00 mg/dL 0.67 1.06(H) 0.56  Sodium 135 - 145 mmol/L 140 136 138  Potassium 3.5 - 5.1 mmol/L 4.9 3.1(L) 3.8  Chloride 98 -  111 mmol/L 111 100 108  CO2 22 - 32 mmol/L 23 25 24   Calcium 8.9 - 10.3 mg/dL 7.9(L) 8.8(L) 8.6(L)      NUTRITION - FOCUSED PHYSICAL EXAM:    Most Recent Value  Orbital Region  Moderate depletion  Upper Arm Region  Mild depletion  Thoracic and Lumbar Region  Mild depletion  Clavicle Bone Region  Mild depletion  Clavicle and Acromion Bone Region  Moderate depletion  Patellar Region  Mild depletion  Skin  Reviewed  Nails  Reviewed      Diet Order:   Diet Order            Diet Heart Room service appropriate? Yes; Fluid consistency: Thin  Diet effective now              EDUCATION NEEDS:   No education needs have been identified at this time Skin:  Skin Assessment: Reviewed RN Assessment  Last BM:  3/8  Height:   Ht Readings from Last 1 Encounters:  03/12/18 5\' 5"  (1.651 m)    Weight:   Wt Readings from Last 1 Encounters:  03/12/18 49.2 kg    Ideal Body Weight:  57 kg  BMI:  Body mass index is 18.06 kg/m.  Estimated Nutritional Needs:   Kcal:  1372- 1568 (28-32 kcal/kg/bw)  Protein:  64-74 gr (1.3-1.5 gr/kg/bw)   Fluid:  1.4-1.5 liters daily    Colman Cater MS,RD,CSG,LDN Office: [phone] Pager: [phone]

## 2018-03-13 NOTE — Plan of Care (Signed)
  Problem: Acute Rehab PT Goals(only PT should resolve) Goal: Patient Will Transfer Sit To/From Stand Outcome: Progressing Flowsheets (Taken 03/13/2018 1050) Patient will transfer sit to/from stand: with supervision Goal: Pt Will Transfer Bed To Chair/Chair To Bed Outcome: Progressing Flowsheets (Taken 03/13/2018 1050) Pt will Transfer Bed to Chair/Chair to Bed: with supervision Goal: Pt Will Ambulate Outcome: Progressing Flowsheets (Taken 03/13/2018 1050) Pt will Ambulate: 50 feet; with least restrictive assistive device; with supervision   Pamala Hurry D. Hartnett-Rands, MS, PT Per [place] [phone] 03/13/2018

## 2018-03-13 NOTE — Evaluation (Signed)
Physical Therapy Evaluation Patient Details Name: Jackie Walls MRN: [id number] DOB: [birth year] Today's Date: 03/13/2018   History of Present Illness  Patient is a 73 year old female admitted 03/12/2018 with diagnosis of COPD with acute exacerbation and symptoms of SOB and diarrhea. PMH: COPD, HTN, malnutrition, anxiety, HLD. Was recently in nursing home. On 2LPM O2 at home. Poor historian. Ongoing tobacco use.    Clinical Impression  Pt admitted with above diagnosis. Pt currently with functional limitations due to the deficits listed below (see PT Problem List). Patient functioning near baseline level ambulating slowly short distances with RW and 2 LPM as at home. She utilizes a 4 wheeled walker at home. Patient exhibits weakness limiting her functional mobility and increasing her risk for falls.  Pt will benefit from skilled PT to increase their independence and safety with mobility to allow discharge to the venue listed below.       Follow Up Recommendations Home health PT;Supervision - Intermittent;Supervision for mobility/OOB    Equipment Recommendations  None recommended by PT    Recommendations for Other Services       Precautions / Restrictions Precautions Precautions: Fall Restrictions Weight Bearing Restrictions: No      Mobility  Bed Mobility Overal bed mobility: Modified Independent             General bed mobility comments: increased time  Transfers Overall transfer level: Needs assistance Equipment used: Rolling walker (2 wheeled) Transfers: Sit to/from [facility] Sit to Stand: Supervision Stand pivot transfers: Min guard       General transfer comment: min guard for safety, reaches for objects without RW  Ambulation/Gait Ambulation/Gait assistance: Min guard Gait Distance (Feet): 40 Feet Assistive device: Rolling walker (2 wheeled) Gait Pattern/deviations: Step-through pattern;Decreased step length - right;Decreased step length -  left;Decreased stride length;Trunk flexed Gait velocity: decreased   General Gait Details: slow, labored movement, limited by fatigue, 2LPM O2 w/ O2 sats 93% upon return from ambulation; no loss of balance.  Stairs            Wheelchair Mobility    Modified Rankin (Stroke Patients Only)       Balance Overall balance assessment: Needs assistance Sitting-balance support: Bilateral upper extremity supported;Feet supported Sitting balance-Leahy Scale: Good     Standing balance support: No upper extremity supported Standing balance-Leahy Scale: Poor Standing balance comment: fair w/ RW during functional activity                             Pertinent Vitals/Pain Pain Assessment: No/denies pain    Home Living Family/patient expects to be discharged to:: Private residence Living Arrangements: Children Available Help at Discharge: Family;Available 24 hours/day Type of Home: Apartment Home Access: Level entry     Home Layout: One level Home Equipment: Clinical cytogeneticist - 4 wheels      Prior Function Level of Independence: Needs assistance   Gait / Transfers Assistance Needed: able to transfer and ambulate with a walker independently; on 2 LPM O2  ADL's / Homemaking Assistance Needed: Independent with ADLs but full assist with household activites.         Hand Dominance   Dominant Hand: Right    Extremity/Trunk Assessment   Upper Extremity Assessment Upper Extremity Assessment: Generalized weakness    Lower Extremity Assessment Lower Extremity Assessment: Generalized weakness    Cervical / Trunk Assessment Cervical / Trunk Assessment: Kyphotic  Communication   Communication: HOH  Cognition  Arousal/Alertness: Awake/alert Behavior During Therapy: WFL for tasks assessed/performed Overall Cognitive Status: Within Functional Limits for tasks assessed                                 General Comments: No family/caregiver present to  determine baseline      General Comments      Exercises     Assessment/Plan    PT Assessment Patient needs continued PT services  PT Problem List Decreased strength;Decreased mobility;Decreased activity tolerance;Decreased balance       PT Treatment Interventions DME instruction;Therapeutic activities;Gait training;Therapeutic exercise;Patient/family education;Balance training;Neuromuscular re-education    PT Goals (Current goals can be found in the Care Plan section)  Acute Rehab PT Goals Patient Stated Goal: get better and go home. PT Goal Formulation: With patient Time For Goal Achievement: 03/27/18 Potential to Achieve Goals: Good    Frequency Min 3X/week   Barriers to discharge        Co-evaluation               AM-PAC PT "6 Clicks" Mobility  Outcome Measure Help needed turning from your back to your side while in a flat bed without using bedrails?: A Little Help needed moving from lying on your back to sitting on the side of a flat bed without using bedrails?: A Little Help needed moving to and from a bed to a chair (including a wheelchair)?: A Little Help needed standing up from a chair using your arms (e.g., wheelchair or bedside chair)?: A Little Help needed to walk in hospital room?: A Little Help needed climbing 3-5 steps with a railing? : A Little 6 Click Score: 18    End of Session Equipment Utilized During Treatment: Gait belt;Oxygen Activity Tolerance: Patient tolerated treatment well;Patient limited by fatigue Patient left: in chair;with call bell/phone within reach(nursing notified) Nurse Communication: Mobility status PT Visit Diagnosis: Unsteadiness on feet (R26.81);Other abnormalities of gait and mobility (R26.89);Muscle weakness (generalized) (M62.81)    Time: 1005-1035 PT Time Calculation (min) (ACUTE ONLY): 30 min   Charges:   PT Evaluation $PT Eval Moderate Complexity: 1 Mod PT Treatments $Gait Training: 8-22 mins         Floria Raveling. Hartnett-Rands, MS, PT Per [place] [phone] 03/13/2018, 10:47 AM

## 2018-03-13 NOTE — Discharge Instructions (Signed)
1)Take  medications as prescribed 2) Avoid Tobacco smoke exposure 3)Follow up with Primary care physician within a week for Recheck

## 2018-04-06 ENCOUNTER — Telehealth: Payer: Self-pay | Admitting: General Practice

## 2018-04-06 MED ORDER — DICYCLOMINE HCL 10 MG PO CAPS: 10.0000 mg | ORAL_CAPSULE | Freq: Three times a day (TID) | ORAL | 0 refills | Status: DC

## 2018-04-06 NOTE — Addendum Note (Signed)
Addended by: Mahala Menghini on: 04/06/2018 03:16 PM   Modules accepted: Orders

## 2018-04-06 NOTE — Telephone Encounter (Signed)
Patient's daughter called in stating he rmother is having diarrhea, she is setup with a telephone visit to see RMR on 4/7 at 9 am.  She would like to speak with the nurse and see if there is something she can do in meantime to help with the diarrhea.  Traci the pts daughter can be reached at [phone]

## 2018-04-06 NOTE — Telephone Encounter (Signed)
Lmom, waiting on a return call.  

## 2018-04-06 NOTE — Telephone Encounter (Signed)
Spoke with pts daughter. Pt started having watery stool the 1st week of March and was in the hospital at [facility] for 1 day. Pt was dehydrated during her hospital stay. Cdiff testing was negative at [facility] on 03/12/18. Watery diarrhea started back 3 days ago and everything that pt drinks comes out. No vomiting to report. Pt is taking liquid imodium and oral imodium with no help.

## 2018-04-06 NOTE — Telephone Encounter (Addendum)
Limit dairy products right now.  If she continues to use imodium make sure no more than recommended dosage on the label (has to count the liquid and oral together).   If imodium not helping, we can try dicyclomine until her telehealth visit. rx sent to the drug store  She has h/o microscopic colitis and may require entocort but this will need to be determined at time of telehealth visit with rmr.

## 2018-04-06 NOTE — Telephone Encounter (Signed)
Pt's daughter notified of LSL's recommendations.

## 2018-04-11 ENCOUNTER — Ambulatory Visit (INDEPENDENT_AMBULATORY_CARE_PROVIDER_SITE_OTHER): Payer: Medicare Other | Admitting: Internal Medicine

## 2018-04-11 ENCOUNTER — Encounter: Payer: Self-pay | Admitting: Internal Medicine

## 2018-04-11 ENCOUNTER — Other Ambulatory Visit: Payer: Self-pay

## 2018-04-11 DIAGNOSIS — R197 Diarrhea, unspecified: Secondary | ICD-10-CM

## 2018-04-11 NOTE — Patient Instructions (Signed)
Continue Bentyl 20 mg orally twice daily as needed diarrhea  Continue to avoid all forms of NSAIDs/aspirin  Trial of pancreatic enzymes in the way of Zenpep- 20,000 units of lipase.  2 capsules with meals 1 with snacks.  Samples provided.  Daughter will call in 3 weeks from now and let us know how things are going with this regimen and we will go from there.  If pancreatic enzymes do not help her diarrhea then further evaluation will be warranted.  We will plan for face-to-face office visit in 3 months from now.  May stay off acid suppression as long as she patient absolutely refrain from taking all forms of NSAIDs/aspirin.

## 2018-04-11 NOTE — Progress Notes (Signed)
Referring Provider:  Primary Care Physician:  Dione Housekeeper, MD  Primary GI:   Virtual Visit via Telephone Note Due to COVID-19, visit is conducted virtually and was requested by patient.   I connected with Jackie Walls on 04/11/18 at  9:00 AM EDT by telephone and verified that I am speaking with the correct person using two identifiers.   I discussed the limitations, risks, security and privacy concerns of performing an evaluation and management service by telephone and the availability of in person appointments. I also discussed with the patient that there may be a patient responsible charge related to this service. The patient expressed understanding and agreed to proceed.  Chief Complaint  Patient presents with  . Diarrhea    2-3x/daily; watery; no blood     History of Present Illness:  73 year old lady with multiple comorbidities off and on chronic diarrhea.  Daughter describes sticky stools incontinence may slow down for couple days but has more diarrhea than constipation during any given period of time.  No bleeding denies melena or hematochezia.  History of peptic ulcer disease prior history of perforation secondary to NSAIDs.  Due to for failed and treated last year.  She is avoiding all nonsteroidal agents.  She continues on Plavix 75 mg daily.  She is now off all acid suppression therapy as daughter describes.  Takes Bentyl 20 mg twice daily as needed diarrhea.  She eliminated dairy products from her diet and thought diarrhea had improved the last several weeks but this was not sustained.  No new medications to implicate.  C. difficile toxin assay recently negative.  Was in the nursing home for several months.  Serological celiac screen came back negative.  I saw this light lady for diarrhea back in 2015 colonoscopy with segmental biopsies negative for microscopic colitis.  Daughter reports her weight is around 208 pounds.  Has not had any nausea or vomiting.   Past  Medical History:  Diagnosis Date  . Abdominal aortic aneurysm ([place])   . ALLERGIC RHINITIS   . Anxiety   . Bronchitis   . Chronic diarrhea   . COPD (chronic obstructive pulmonary disease) ([place])   . Duodenal ulcer   . Hyperlipidemia   . Hypertension   . IDA (iron deficiency anemia)   . Multiple gastric ulcers   . Thoracic aortic aneurysm [facility])      Past Surgical History:  Procedure Laterality Date  . BIOPSY  04/02/2017   Procedure: BIOPSY;  Surgeon: Danie Binder, MD;  [facility];  Service: Endoscopy;;  gastric and gastric ulcer  . CAROTID ENDARTERECTOMY Right   . CESAREAN SECTION    . COLONOSCOPY N/A 08/13/2013   [id number] polyps-removed as described above. Subtlyabnormal colonic mucosa-status post segmental biopsy.  EGD not  . ESOPHAGOGASTRODUODENOSCOPY N/A 05/15/2013   Dr. Oneida Alar: moderate erosive gastritis, duodenal ulcers, visible vessel with active oozing s/p bleeding control therapy, NSAID induced gastritis  . ESOPHAGOGASTRODUODENOSCOPY N/A 08/13/2013   RMR: EGD not needed and not done  . ESOPHAGOGASTRODUODENOSCOPY  02/23/2017   [facility]: arge 4 cm, deep, cratered ulcer along the lesser curvature of the stomach but no signs of active bleeding. H. pylori stool antigen is negative.   . ESOPHAGOGASTRODUODENOSCOPY N/A 04/02/2017   Dr. Oneida Alar: normal esophagus, large non-bleeding cratered gastric ulcer, normal duodenum, secondary to aspirin   . ESOPHAGOGASTRODUODENOSCOPY N/A 07/18/2017   Procedure: ESOPHAGOGASTRODUODENOSCOPY (EGD);  Surgeon: Danie Binder, MD;  [facility];  Service: Endoscopy;  Laterality: N/A;  .  GASTRORRHAPHY N/A 04/25/2013   Procedure: GASTRORRHAPHY;  Surgeon: Jamesetta So, MD;  Location: [facility];  Service: General;  Laterality: N/A;  . LAPAROTOMY N/A 04/25/2013   Procedure: EXPLORATORY LAPAROTOMY;  Surgeon: Jamesetta So, MD;  Location: [facility];  Service: General;  Laterality: N/A;     Current Meds  Medication Sig  . acetaminophen  (TYLENOL) 325 MG tablet Take 2 tablets (650 mg total) by mouth every 6 (six) hours as needed for mild pain (or Fever >/= 101).  Marland Kitchen albuterol (PROVENTIL) (2.5 MG/3ML) 0.083% nebulizer solution Inhale 3 mLs into the lungs as needed.   Marland Kitchen alendronate (FOSAMAX) 70 MG tablet Take 70 mg by mouth once a week. Take with a full glass of water on an empty stomach. Takes on Sat.  Marland Kitchen amLODipine (NORVASC) 5 MG tablet Take 1 tablet (5 mg total) by mouth daily.  Marland Kitchen atorvastatin (LIPITOR) 40 MG tablet Take 1 tablet by mouth daily.  . budesonide-formoterol (SYMBICORT) 160-4.5 MCG/ACT inhaler Inhale 2 puffs into the lungs as needed.   . clopidogrel (PLAVIX) 75 MG tablet Take 1 tablet (75 mg total) by mouth daily.  Marland Kitchen dicyclomine (BENTYL) 10 MG capsule Take 1 capsule (10 mg total) by mouth 4 (four) times daily -  before meals and at bedtime. As needed for diarrhea  . doxazosin (CARDURA) 4 MG tablet Take 4 mg by mouth every morning.   Marland Kitchen guaiFENesin (MUCINEX) 600 MG 12 hr tablet Take 1 tablet (600 mg total) by mouth 2 (two) times daily.  Marland Kitchen HYDROcodone-acetaminophen (NORCO/VICODIN) 5-325 MG tablet Take 1 tablet by mouth 2 (two) times daily.   Marland Kitchen losartan (COZAAR) 50 MG tablet Take 1 tablet (50 mg total) by mouth daily.  . mirtazapine (REMERON) 15 MG tablet Take 1 tablet by mouth at bedtime.  . pantoprazole (PROTONIX) 40 MG tablet Take 1 tablet (40 mg total) by mouth 2 (two) times daily.  . potassium chloride SA (K-DUR,KLOR-CON) 20 MEQ tablet Take 1 tablet by mouth daily.  Marland Kitchen umeclidinium bromide (INCRUSE ELLIPTA) 62.5 MCG/INH AEPB Take 1 Inhaler by mouth daily.  . Vitamin D, Ergocalciferol, (DRISDOL) 1.25 MG (50000 UT) CAPS capsule Take 50,000 Units by mouth every 7 (seven) days.       Observations/Objective: No distress. Unable to perform physical exam due to telephone encounter. No video available.   Assessment and Plan: 73 year old debilitated lady with multiple comorbidities intermittent, chronic diarrhea seems to be  a major issue at this time.  Bouts of incontinence.  Celiac, C. difficile negative.  No new medications to implicate.  No evidence of microscopic colitis on prior biopsy. Although irritable bowel syndrome is always a consideration need to keep in mind the possibility of pancreatic exocrine deficiency.  History of multiple gastric ulcers explained by NSAID use previously.  I doubt were dealing with a hypersecretory state causing diarrhea but that needs to be kept in mind as well.    Follow Up Instructions:  Continue Bentyl 20 mg orally twice daily as needed diarrhea  Continue to avoid all forms of NSAIDs/aspirin  Trial of pancreatic enzymes in the way of Zenpep- 20,000 units of lipase.  2 capsules with meals 1 with snacks.  Samples provided.  Daughter will call in 3 weeks from now and let us know how things are going with this regimen and we will go from there.  If pancreatic enzymes do not help her diarrhea then further evaluation will be warranted.  We will plan for face-to-face office visit in 3 months  from now.  May stay off acid suppression as long as she patient absolutely refrain from taking all forms of NSAIDs/aspirin.    I discussed the assessment and treatment plan with the patient. The patient was provided an opportunity to ask questions and all were answered. The patient agreed with the plan and demonstrated an understanding of the instructions.   The patient was advised to call back or seek an in-person evaluation if the symptoms worsen or if the condition fails to improve as anticipated.  I provided 11 minutes of non-face-to-face time during this encounter.   R Garfield Cornea, MD [facility]

## 2018-04-19 ENCOUNTER — Telehealth: Payer: Self-pay | Admitting: Internal Medicine

## 2018-04-19 ENCOUNTER — Other Ambulatory Visit: Payer: Self-pay

## 2018-04-19 NOTE — Telephone Encounter (Signed)
Pt was seen 04/13/18 via Telephone visit for diarrhea. Spoke with pts daughter. Pt is having bowel movements three times a day in a watery diarrhea form. Pt is taking Dicyclomine up to 4 times a day and Zenpep as directed. Daughter states it looks clear like urine when she has the bowel movements. Pts daughter gave her an Imodium pill yesterday and states it doesn't help. Pt is staying hydrated with fluids and eats due to her daughter making sure she does. Pt's appetite isn't big at all and pt continues to eat per her daughters request.

## 2018-04-19 NOTE — Telephone Encounter (Signed)
Spoke with Daughter. Not able to place orders for [facility] for GIP. Will discuss with Almyra Free 04/20/14. Test code [phone]

## 2018-04-19 NOTE — Telephone Encounter (Signed)
Pt's daughter, Linus Orn, called to say that the medicine that RMR put her on isn't helping and she still has diarrhea. Please advise and call Tracey at [phone]

## 2018-04-19 NOTE — Telephone Encounter (Signed)
Diarrhea no better; cdiff x 2 negative; cont loperamide, avoid dehydrarion;  Lets do a GIP on stool.  I dont see any meds to blame.  If gip negative, will need a repat colonoscopy soon

## 2018-04-20 ENCOUNTER — Other Ambulatory Visit: Payer: Self-pay

## 2018-04-20 NOTE — Telephone Encounter (Signed)
Order [phone]) to [facility] with test code [phone]. [facility] received lab and pt's daughter is aware.

## 2018-04-24 ENCOUNTER — Other Ambulatory Visit: Payer: Self-pay | Admitting: Gastroenterology

## 2018-05-09 ENCOUNTER — Other Ambulatory Visit: Payer: Self-pay | Admitting: Gastroenterology

## 2018-05-09 ENCOUNTER — Other Ambulatory Visit: Payer: Self-pay

## 2018-05-09 DIAGNOSIS — R197 Diarrhea, unspecified: Secondary | ICD-10-CM

## 2018-05-09 NOTE — Progress Notes (Addendum)
Received call from [name], NP, who has patient in the office today. Profuse, watery, foul-smelling diarrhea. Multiple episodes, large amounts, unable to control. Check Cdiff, stool culture, Giardia, and I am also recommending a fasting serum gastrin level. History of PUD (felt to be related to NSAIDs), chronic diarrhea, weight loss, failure to thrive. Hypersecretory state less likely but needs to be ruled out at this point. Discussed with NP. I have placed the orders through [facility] and they will release.  Elmo Putt, can you release and fax orders to [name], NP, at [phone]? Thanks!

## 2018-05-09 NOTE — Progress Notes (Signed)
Noted. Lab orders released and faxed.

## 2018-05-12 ENCOUNTER — Telehealth: Payer: Self-pay | Admitting: Gastroenterology

## 2018-05-12 NOTE — Telephone Encounter (Signed)
Spoke with [employer]. Labs unrevealing. She will send in [place]. I would not empirically treat for CDI at this point, as we have a recent negative stool sample. Would benefit from GI pathogen panel, but she is unable to obtain this due to reports of persistent loose stool particularly in the morning that is unable to be collected.  Jackie Walls: we need to have a virtual visit for her in first available as soon as possible. Will need to arrange a colonoscopy with biopsies.

## 2018-05-15 NOTE — Telephone Encounter (Signed)
PATIENT SCHEDULED FOR TOMORROW AS A PHONE VISIT, CALLED AND L/M ON HER MACHINE

## 2018-05-16 ENCOUNTER — Other Ambulatory Visit: Payer: Self-pay

## 2018-05-16 ENCOUNTER — Ambulatory Visit (INDEPENDENT_AMBULATORY_CARE_PROVIDER_SITE_OTHER): Payer: Medicare Other | Admitting: Gastroenterology

## 2018-05-16 ENCOUNTER — Encounter: Payer: Self-pay | Admitting: Gastroenterology

## 2018-05-16 DIAGNOSIS — R197 Diarrhea, unspecified: Secondary | ICD-10-CM | POA: Diagnosis not present

## 2018-05-16 MED ORDER — BUDESONIDE 3 MG PO CPEP: 6.0000 mg | ORAL_CAPSULE | Freq: Every day | ORAL | 3 refills | Status: DC

## 2018-05-16 NOTE — Progress Notes (Signed)
Primary Care Physician:  Jackie Housekeeper, MD  Primary GI: Dr. Gala Walls   Virtual Visit via Telephone Note Due to COVID-19, visit is conducted virtually and was requested by patient.   I connected with Jackie Walls on 05/17/18 at  2:30 PM EDT by telephone and verified that I am speaking with the correct person using two identifiers.   I discussed the limitations, risks, security and privacy concerns of performing an evaluation and management service by telephone and the availability of in person appointments. I also discussed with the patient that there may be a patient responsible charge related to this service. The patient expressed understanding and agreed to proceed.  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 She had a virtual visit April 2020 and started empirically on pancreatic enzyme therapy. She has not noted any improvement. Jackie Hartigan, NP who sees patient in primary care, called regarding concerns for continued diarrhea when patient was recently in her office. Labs completed with Hgb 8.9, Creatinine 0.78, potassium 3.8, LFTs normal, gastrin level normal. Virtual visit now due to persistent diarrhea affecting quality of life.   Daughter notes diarrhea since Feb 16, 2018. Daughter used: imodium, pepto, kaopectate, pancreatic enzymes, questran. No improvement. Watery stool. "runs". Can't get a stool sample as running down legs and into the floor. Absorbed in diaper. No formed stool since Feb 2020. Was in [facility]. Stomach ache the last few days. Mother doesn't complain that much. Downplays symptoms. Stool black once on pepto but now back to brown. Incontinent episodes. Having to  throw clothes away. We do have a negative Cdiff quick scan on file from March 2020. I had ordered additional stool studies, but it has been impossible to collect per daughter. She does have a history of CDI in March 2019.    Past Medical History:  Diagnosis Date   Abdominal aortic aneurysm ([facility])    ALLERGIC RHINITIS    Anxiety    Bronchitis    Chronic diarrhea    COPD (chronic obstructive pulmonary disease) ([facility])    Duodenal ulcer    Hyperlipidemia    Hypertension    IDA (iron deficiency anemia)    Multiple gastric ulcers    Thoracic aortic aneurysm [facility])      Past Surgical History:  Procedure Laterality Date   BIOPSY  04/02/2017   Procedure: BIOPSY;  Surgeon: Danie Binder, MD;  [facility];  Service: Endoscopy;;  gastric and gastric ulcer   CAROTID ENDARTERECTOMY Right    CESAREAN SECTION     COLONOSCOPY N/A 08/13/2013   tubular adenomas, segmental biopsies with collagenous colitis   ESOPHAGOGASTRODUODENOSCOPY N/A 05/15/2013   Dr. Oneida Alar: moderate erosive gastritis, duodenal ulcers, visible vessel with active oozing s/p bleeding control therapy, NSAID induced gastritis   ESOPHAGOGASTRODUODENOSCOPY N/A 08/13/2013   [facility]: EGD not needed and not done   ESOPHAGOGASTRODUODENOSCOPY  02/23/2017   [facility]: arge 4 cm, deep, cratered ulcer along the lesser curvature of the stomach but no signs of active bleeding. H. pylori stool antigen is negative.    ESOPHAGOGASTRODUODENOSCOPY N/A 04/02/2017   Dr. Oneida Alar: normal esophagus, large non-bleeding cratered gastric ulcer, normal duodenum, secondary to aspirin  ESOPHAGOGASTRODUODENOSCOPY N/A 07/18/2017   PUD improved but not resolved in light of NSAID use, [place]ul[place] lesion of stomach and oozing gastric ulcer s/p clips and epi   GASTRORRHAPHY N/A 04/25/2013   Procedure: GASTRORRHAPHY;  Surgeon: Jamesetta So, MD;  Location: [facility];  Service: General;  Laterality: N/A;   LAPAROTOMY N/A 04/25/2013   Procedure:  EXPLORATORY LAPAROTOMY;  Surgeon: Jamesetta So, MD;  Location: [facility];  Service: General;  Laterality: N/A;     Current Meds  Medication Sig   acetaminophen (TYLENOL) 325 MG tablet Take 2 tablets (650 mg total) by mouth every 6 (six) hours as needed for mild pain (or Fever >/= 101).   albuterol (PROVENTIL) (2.5 MG/3ML) 0.083% nebulizer solution Inhale 3 mLs into the lungs as needed.    alendronate (FOSAMAX) 70 MG tablet Take 70 mg by mouth once a week. Take with a full glass of water on an empty stomach. Takes on Sat.   amLODipine (NORVASC) 5 MG tablet Take 1 tablet (5 mg total) by mouth daily.   atorvastatin (LIPITOR) 40 MG tablet Take 1 tablet by mouth daily.   budesonide-formoterol (SYMBICORT) 160-4.5 MCG/ACT inhaler Inhale 2 puffs into the lungs as needed.    clopidogrel (PLAVIX) 75 MG tablet Take 1 tablet (75 mg total) by mouth daily.   dicyclomine (BENTYL) 10 MG capsule TAKE 1 CAPSULE 4 TIMES DAILY BEFORE MEALS AND AT BEDTIME AS NEEDED FOR DIARRHEA   doxazosin (CARDURA) 4 MG tablet Take 4 mg by mouth every morning.    guaiFENesin (MUCINEX) 600 MG 12 hr tablet Take 1 tablet (600 mg total) by mouth 2 (two) times daily.   HYDROcodone-acetaminophen (NORCO/VICODIN) 5-325 MG tablet Take 1 tablet by mouth 2 (two) times daily.    losartan (COZAAR) 50 MG tablet Take 1 tablet (50 mg total) by mouth daily.   mirtazapine (REMERON) 15 MG tablet Take 1 tablet by mouth at bedtime.   pantoprazole (PROTONIX) 40 MG tablet Take 1 tablet (40 mg total) by mouth 2 (two) times daily.   potassium chloride SA (K-DUR,KLOR-CON) 20 MEQ tablet Take 1 tablet by mouth daily.   umeclidinium bromide (INCRUSE ELLIPTA) 62.5 MCG/INH AEPB Take 1 Inhaler by mouth daily.   Vitamin D, Ergocalciferol, (DRISDOL) 1.25 MG (50000 UT) CAPS capsule Take 50,000 Units by mouth every 7 (seven) days.     Family History  Problem Relation Age of Onset   Cancer Sister    Colon cancer Neg Hx     Social History    Socioeconomic History   Marital status: Widowed    Spouse name: Not on file   Number of children: Not on file   Years of education: Not on file   Highest education level: Not on file  Occupational History   Not on file  Social Needs   Financial resource strain: Not on file   Food insecurity:    Worry: Not on file    Inability: Not on file   Transportation needs:    Medical: Not on file    Non-medical: Not on file  Tobacco Use   Smoking status: Current Every Day Smoker    Packs/day: 0.25    Types: Cigarettes    Start date: 01/05/1948   Smokeless tobacco: Never Used  Substance and Sexual Activity   Alcohol use: No   Drug use: No   Sexual activity: Not on file  Lifestyle   Physical activity:    Days per week: Not on file    Minutes per  session: Not on file   Stress: Not on file  Relationships   Social connections:    Talks on phone: Not on file    Gets together: Not on file    Attends religious service: Not on file    Active member of club or organization: Not on file    Attends meetings of clubs or organizations: Not on file    Relationship status: Not on file  Other Topics Concern   Not on file  Social History Narrative   Lives with her husband at home.       Review of Systems: Gen: Denies fever, chills, anorexia. Denies fatigue, weakness, weight loss.  CV: Denies chest pain, palpitations, syncope, peripheral edema, and claudication. Resp: COPD, emphysema, on  GI: see HPI Derm: Denies rash, itching, dry skin Psych: Denies depression, anxiety, memory loss, confusion. No homicidal or suicidal ideation.  Heme: Denies bruising, bleeding, and enlarged lymph nodes.  Observations/Objective: No distress. Chronically ill-appearing. 2 liters nasal cannula O2 at all times.   Assessment and Plan: 73 year old female with persistent diarrhea, incontinence since Feb 2020. Negative Cdiff March 2020. Unable to collect any further stool samples due to  incontinence. Thus far, has failed multiple measures including imodium, pepto bismol, kaopectate, pancreatic enzymes, and most recently [place]. She does have a history of CDI in March 2019, but we have a negative stool specimen recently. It is unfortunate we can't collect GI pathogen panel, but daughter states this is nearly impossible. I do note she has a history of collagenous colitis in 2015. With her history of PUD, felt secondary to NSAIDs, I did still request a gastrin level from PCP, which was normal.   At this point, needs diagnostic colonoscopy with segmental biopsies. Could possibly be dealing with collagenous colitis in light of her history. Can't completely exclude Cdiff although would be unlikely with recent negative stool sample. Due to severity of symptoms, I have sent in Entocort to start until colonoscopy can be completed. Will start at 6 mg. This may not be covered well with insurance, so I have asked the daughter to call if any issues. Continue PPI therapy.   Proceed with TCS with Dr. Gala Walls in near future: the risks, benefits, and alternatives have been discussed with the patient in detail. The patient states understanding and desires to proceed. Propofol due to multiple comorbidities  Follow Up Instructions:    I discussed the assessment and treatment plan with the patient. The patient was provided an opportunity to ask questions and all were answered. The patient agreed with the plan and demonstrated an understanding of the instructions.   The patient was advised to call back or seek an in-person evaluation if the symptoms worsen or if the condition fails to improve as anticipated.  I provided 20 minutes of face-to-face time during this encounter via video call.   Annitta Needs, PhD, ANP-BC [facility]

## 2018-05-17 ENCOUNTER — Other Ambulatory Visit: Payer: Self-pay

## 2018-05-17 ENCOUNTER — Encounter: Payer: Self-pay | Admitting: Internal Medicine

## 2018-05-17 ENCOUNTER — Telehealth: Payer: Self-pay

## 2018-05-17 ENCOUNTER — Encounter: Payer: Self-pay | Admitting: Gastroenterology

## 2018-05-17 DIAGNOSIS — R197 Diarrhea, unspecified: Secondary | ICD-10-CM

## 2018-05-17 MED ORDER — PEG 3350-KCL-NA BICARB-NACL 420 G PO SOLR: 4000.0000 mL | ORAL | 0 refills | Status: AC

## 2018-05-17 NOTE — Patient Instructions (Signed)
Please start taking 2 Entocort capsules daily. Call us if any issues with insurance coverage.   We have arranged a colonoscopy with Dr. Gala Romney in the near future.  We will see you in 3 months!  Further recommendations to follow!  I enjoyed seeing you again today! As you know, I value our relationship and want to provide genuine, compassionate, and quality care. I welcome your feedback. If you receive a survey regarding your visit,  I greatly appreciate you taking time to fill this out. See you next time!  Annitta Needs, PhD, ANP-BC [facility]

## 2018-05-17 NOTE — Telephone Encounter (Signed)
Per AB, pt's TCS needs to be done w/Propofol. Called pt's daughter, TCS w/Propofol rescheduled to 06/12/18 at 2:45pm. Endo scheduler informed. Orders re-entered. Will mail new instructions when pre-op appt is scheduled. Daughter aware pt will be scheduled for COVID-19 testing prior to procedure and quarantine after testing until procedure.

## 2018-05-17 NOTE — Telephone Encounter (Signed)
Called pt's daughter Tressia Miners), TCS w/RMR scheduled for 05/31/18 at 7:30am (asap per AB). Rx for prep sent to pharmacy. Orders entered. Instructions mailed. Address updated in Epic as pt is now living with her daughter.

## 2018-05-18 NOTE — Progress Notes (Signed)
cc'ed to pcp °

## 2018-05-22 NOTE — Telephone Encounter (Signed)
Endo scheduler advised that she will call pt's daughter to schedule pre-op appt and COVID test. New procedure instructions mailed.

## 2018-05-31 ENCOUNTER — Encounter (HOSPITAL_COMMUNITY): Payer: Self-pay

## 2018-05-31 ENCOUNTER — Ambulatory Visit (HOSPITAL_COMMUNITY): Admit: 2018-05-31 | Payer: Medicare Other | Admitting: Internal Medicine

## 2018-05-31 SURGERY — COLONOSCOPY
Anesthesia: Moderate Sedation

## 2018-06-05 ENCOUNTER — Telehealth: Payer: Self-pay | Admitting: *Deleted

## 2018-06-05 NOTE — Telephone Encounter (Signed)
Called pt's daughter, TCS w/Propofol w/RMR rescheduled to 09/18/18 at 2:00pm. LMOVM for endo scheduler. Pt already has prep.

## 2018-06-05 NOTE — Telephone Encounter (Signed)
Pt's daughter Tressia Miners) called to re-schedule her mother's procedure further out.  She says her mom is scared to have it right now.  I told her that we could re-schedule it to sometime in September.  Can you call them back when you get a moment?     [phone]

## 2018-06-06 NOTE — Telephone Encounter (Signed)
Pre-op appt 09/14/18 at 12:45pm. Letter mailed with procedure instructions.

## 2018-06-08 ENCOUNTER — Other Ambulatory Visit (HOSPITAL_COMMUNITY): Payer: Medicare Other

## 2018-06-13 ENCOUNTER — Encounter (HOSPITAL_COMMUNITY): Payer: Self-pay

## 2018-06-13 ENCOUNTER — Other Ambulatory Visit: Payer: Self-pay

## 2018-06-13 ENCOUNTER — Emergency Department (HOSPITAL_COMMUNITY): Payer: Medicare Other

## 2018-06-13 ENCOUNTER — Observation Stay (HOSPITAL_COMMUNITY)
Admission: EM | Admit: 2018-06-13 | Discharge: 2018-06-14 | Disposition: A | Discharge status: Home / Self Care | Payer: Medicare Other | Attending: Internal Medicine | Admitting: Internal Medicine

## 2018-06-13 DIAGNOSIS — R079 Chest pain, unspecified: Principal | ICD-10-CM | POA: Diagnosis present

## 2018-06-13 DIAGNOSIS — R072 Precordial pain: Secondary | ICD-10-CM

## 2018-06-13 DIAGNOSIS — J9611 Chronic respiratory failure with hypoxia: Secondary | ICD-10-CM

## 2018-06-13 DIAGNOSIS — I10 Essential (primary) hypertension: Secondary | ICD-10-CM | POA: Diagnosis present

## 2018-06-13 DIAGNOSIS — K52831 Collagenous colitis: Secondary | ICD-10-CM | POA: Diagnosis not present

## 2018-06-13 DIAGNOSIS — Z20828 Contact with and (suspected) exposure to other viral communicable diseases: Secondary | ICD-10-CM | POA: Insufficient documentation

## 2018-06-13 DIAGNOSIS — J449 Chronic obstructive pulmonary disease, unspecified: Secondary | ICD-10-CM | POA: Diagnosis not present

## 2018-06-13 DIAGNOSIS — R197 Diarrhea, unspecified: Secondary | ICD-10-CM | POA: Insufficient documentation

## 2018-06-13 DIAGNOSIS — J9610 Chronic respiratory failure, unspecified whether with hypoxia or hypercapnia: Secondary | ICD-10-CM | POA: Diagnosis present

## 2018-06-13 DIAGNOSIS — K279 Peptic ulcer, site unspecified, unspecified as acute or chronic, without hemorrhage or perforation: Secondary | ICD-10-CM | POA: Diagnosis not present

## 2018-06-13 DIAGNOSIS — D509 Iron deficiency anemia, unspecified: Secondary | ICD-10-CM | POA: Diagnosis present

## 2018-06-13 DIAGNOSIS — D649 Anemia, unspecified: Secondary | ICD-10-CM | POA: Insufficient documentation

## 2018-06-13 LAB — BASIC METABOLIC PANEL
Anion gap: 11 (ref 5–15)
BUN: 19 mg/dL (ref 8–23)
CO2: 26 mmol/L (ref 22–32)
Calcium: 9.1 mg/dL (ref 8.9–10.3)
Chloride: 100 mmol/L (ref 98–111)
Creatinine, Ser: 0.73 mg/dL (ref 0.44–1.00)
GFR calc Af Amer: >60 mL/min (ref >60)
GFR calc non Af Amer: >60 mL/min (ref >60)
Glucose, Bld: 95 mg/dL (ref 70–99)
Potassium: 3.5 mmol/L (ref 3.5–5.1)
Sodium: 137 mmol/L (ref 135–145)

## 2018-06-13 LAB — CBC
HCT: 34.6 % — ABNORMAL LOW (ref 36.0–46.0)
Hemoglobin: 10.6 g/dL — ABNORMAL LOW (ref 12.0–15.0)
MCH: 28.6 pg (ref 26.0–34.0)
MCHC: 30.6 g/dL (ref 30.0–36.0)
MCV: 93.3 fL (ref 80.0–100.0)
Platelets: 313 10*3/uL (ref 150–400)
RBC: 3.71 MIL/uL — ABNORMAL LOW (ref 3.87–5.11)
RDW: 14.6 % (ref 11.5–15.5)
WBC: 5.5 10*3/uL (ref 4.0–10.5)
nRBC: 0 % (ref 0.0–0.2)

## 2018-06-13 LAB — TROPONIN I
Troponin I: <0.03 ng/mL (ref <0.03)
Troponin I: <0.03 ng/mL (ref <0.03)

## 2018-06-13 LAB — SARS CORONAVIRUS 2 BY RT PCR (HOSPITAL ORDER, PERFORMED IN ~~LOC~~ HOSPITAL LAB): SARS Coronavirus 2: NEGATIVE

## 2018-06-13 MED ORDER — ACETAMINOPHEN 325 MG PO TABS: 650.0000 mg | ORAL_TABLET | ORAL | Status: DC | PRN

## 2018-06-13 MED ORDER — LOSARTAN POTASSIUM 50 MG PO TABS
50.0000 mg | ORAL_TABLET | Freq: Every day | ORAL | Status: DC
  Administered 2018-06-14: 50 mg via ORAL
  Filled 2018-06-13: qty 1

## 2018-06-13 MED ORDER — MORPHINE SULFATE (PF) 2 MG/ML IV SOLN: 1.0000 mg | INTRAVENOUS | Status: DC | PRN

## 2018-06-13 MED ORDER — ONDANSETRON HCL 4 MG/2ML IJ SOLN: 4.0000 mg | Freq: Four times a day (QID) | INTRAMUSCULAR | Status: DC | PRN

## 2018-06-13 MED ORDER — MIRTAZAPINE 15 MG PO TABS
15.0000 mg | ORAL_TABLET | Freq: Every day | ORAL | Status: DC
  Administered 2018-06-13: 22:00:00 15 mg via ORAL
  Filled 2018-06-13: qty 1

## 2018-06-13 MED ORDER — NITROGLYCERIN 0.4 MG SL SUBL: 0.4000 mg | SUBLINGUAL_TABLET | SUBLINGUAL | Status: DC | PRN

## 2018-06-13 MED ORDER — AMLODIPINE BESYLATE 5 MG PO TABS
5.0000 mg | ORAL_TABLET | Freq: Every day | ORAL | Status: DC
  Administered 2018-06-14: 5 mg via ORAL
  Filled 2018-06-13: qty 1

## 2018-06-13 MED ORDER — SODIUM CHLORIDE 0.9% FLUSH: 3.0000 mL | INTRAVENOUS | Status: DC | PRN

## 2018-06-13 MED ORDER — ATORVASTATIN CALCIUM 40 MG PO TABS
40.0000 mg | ORAL_TABLET | Freq: Every day | ORAL | Status: DC
  Administered 2018-06-13: 40 mg via ORAL
  Filled 2018-06-13: qty 1

## 2018-06-13 MED ORDER — POTASSIUM CHLORIDE CRYS ER 20 MEQ PO TBCR
20.0000 meq | EXTENDED_RELEASE_TABLET | Freq: Every day | ORAL | Status: DC
  Administered 2018-06-14: 20 meq via ORAL
  Filled 2018-06-13: qty 1

## 2018-06-13 MED ORDER — ENOXAPARIN SODIUM 40 MG/0.4ML ~~LOC~~ SOLN
40.0000 mg | SUBCUTANEOUS | Status: DC
  Administered 2018-06-13: 40 mg via SUBCUTANEOUS
  Filled 2018-06-13: qty 0.4

## 2018-06-13 MED ORDER — SODIUM CHLORIDE 0.9 % IV SOLN: 250.0000 mL | INTRAVENOUS | Status: DC | PRN

## 2018-06-13 MED ORDER — PANTOPRAZOLE SODIUM 40 MG PO TBEC
40.0000 mg | DELAYED_RELEASE_TABLET | Freq: Two times a day (BID) | ORAL | Status: DC
  Administered 2018-06-13 – 2018-06-14 (×2): 40 mg via ORAL
  Filled 2018-06-13 (×2): qty 1

## 2018-06-13 MED ORDER — DOXAZOSIN MESYLATE 2 MG PO TABS
4.0000 mg | ORAL_TABLET | Freq: Every morning | ORAL | Status: DC
  Administered 2018-06-14: 09:00:00 4 mg via ORAL
  Filled 2018-06-13: qty 2

## 2018-06-13 MED ORDER — SODIUM CHLORIDE 0.9% FLUSH: 3.0000 mL | Freq: Once | INTRAVENOUS | Status: DC

## 2018-06-13 MED ORDER — MOMETASONE FURO-FORMOTEROL FUM 200-5 MCG/ACT IN AERO
2.0000 | INHALATION_SPRAY | Freq: Two times a day (BID) | RESPIRATORY_TRACT | Status: DC
  Administered 2018-06-14: 2 via RESPIRATORY_TRACT
  Filled 2018-06-13: qty 8.8

## 2018-06-13 MED ORDER — ASPIRIN 81 MG PO CHEW
324.0000 mg | CHEWABLE_TABLET | ORAL | Status: AC
  Administered 2018-06-13: 22:00:00 324 mg via ORAL
  Filled 2018-06-13: qty 4

## 2018-06-13 MED ORDER — SODIUM CHLORIDE 0.9% FLUSH
3.0000 mL | Freq: Two times a day (BID) | INTRAVENOUS | Status: DC
  Administered 2018-06-13 – 2018-06-14 (×2): 3 mL via INTRAVENOUS

## 2018-06-13 MED ORDER — ASPIRIN 300 MG RE SUPP: 300.0000 mg | RECTAL | Status: AC

## 2018-06-13 MED ORDER — BUDESONIDE 3 MG PO CPEP: 6.0000 mg | ORAL_CAPSULE | Freq: Every day | ORAL | Status: DC

## 2018-06-13 MED ORDER — ALBUTEROL SULFATE (2.5 MG/3ML) 0.083% IN NEBU
3.0000 mL | INHALATION_SOLUTION | RESPIRATORY_TRACT | Status: DC | PRN
  Filled 2018-06-13: qty 3

## 2018-06-13 MED ORDER — GUAIFENESIN ER 600 MG PO TB12
600.0000 mg | ORAL_TABLET | Freq: Two times a day (BID) | ORAL | Status: DC
  Administered 2018-06-14: 600 mg via ORAL
  Filled 2018-06-13: qty 1

## 2018-06-13 MED ORDER — UMECLIDINIUM BROMIDE 62.5 MCG/INH IN AEPB
1.0000 | INHALATION_SPRAY | Freq: Every day | RESPIRATORY_TRACT | Status: DC
  Administered 2018-06-14: 1 via RESPIRATORY_TRACT
  Filled 2018-06-13: qty 7

## 2018-06-13 MED ORDER — CLOPIDOGREL BISULFATE 75 MG PO TABS
75.0000 mg | ORAL_TABLET | Freq: Every day | ORAL | Status: DC
  Administered 2018-06-14: 75 mg via ORAL
  Filled 2018-06-13: qty 1

## 2018-06-13 NOTE — ED Triage Notes (Signed)
Pt brought in by EMS due to substernal CP that began apprx one hour ago while sitting in chair. Initial BP sys 200, given nitro x1 which brought BP down to 90 then increase back to 124. Pt reports improvement of pain.

## 2018-06-13 NOTE — ED Provider Notes (Signed)
Emergency Department Provider Note   I have reviewed the triage vital signs and the nursing notes.   HISTORY  Chief Complaint Chest Pain   HPI Jackie Walls is a 73 y.o. female with PMH of COPD, HLD, HTN, and AAA presents to the emergency department for evaluation of substernal chest pain/pressure.  Symptoms began 1 hour prior to ED arrival.  Patient called EMS who arrived on scene to find a systolic blood pressure of 200.  They gave 1 sublingual nitroglycerin which decreased the patient's blood pressure and improved her chest pain.  Patient states that she is no longer having chest pain.  She denies history of similar pain in the past.  Denies history of CAD.  Denies any fevers, chills. No radiation of symptoms or modifying factors.    Past Medical History:  Diagnosis Date  . Abdominal aortic aneurysm ([place])   . ALLERGIC RHINITIS   . Anxiety   . Bronchitis   . Chronic diarrhea   . COPD (chronic obstructive pulmonary disease) ([place])   . Duodenal ulcer   . Hyperlipidemia   . Hypertension   . IDA (iron deficiency anemia)   . Multiple gastric ulcers   . Thoracic aortic aneurysm [facility])     Patient Active Problem List   Diagnosis Date Noted  . Chest pain 06/13/2018  . IDA (iron deficiency anemia) 06/13/2018  . COPD with acute exacerbation ([place]) 03/12/2018  . Tobacco abuse   . Hyperkalemia 07/17/2017  . Blood loss anemia 04/01/2017  . Hyponatremia 04/01/2017  . Hypotension due to drugs   . PUD (peptic ulcer disease) 03/14/2017  . Abnormal weight loss 03/14/2017  . Abdominal pain 03/14/2017  . Diarrhea 11/08/2014  . Microscopic colitis 11/08/2014  . Loose stools 07/29/2013  . Duodenal ulcer 07/29/2013  . Bleeding duodenal ulcer 05/15/2013  . Gastritis 05/15/2013  . Acute blood loss anemia 05/14/2013  . Symptomatic anemia 05/13/2013  . Heme positive stool 05/13/2013  . Chronic respiratory failure ([place]) 05/13/2013  . Upper GI bleed 05/13/2013  . Intra-abdominal free  air of unknown etiology 04/25/2013  . Bowel perforation ([place]) 04/25/2013  . Protein-calorie malnutrition, severe ([place]) 04/25/2013  . Aneurysm of abdominal vessel ([place]) 11/14/2012  . 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 COPD (chronic obstructive pulmonary disease) ([place]) 01/29/2012  . HTN (hypertension) 01/29/2012  . Bronchitis   . Hyperlipidemia     Past Surgical History:  Procedure Laterality Date  . BIOPSY  04/02/2017   Procedure: BIOPSY;  Surgeon: Danie Binder, MD;  [facility];  Service: Endoscopy;;  gastric and gastric ulcer  . CAROTID ENDARTERECTOMY Right   . CESAREAN SECTION    . COLONOSCOPY N/A 08/13/2013   tubular adenomas, segmental biopsies with collagenous colitis  . ESOPHAGOGASTRODUODENOSCOPY N/A 05/15/2013   Dr. Oneida Alar: moderate erosive gastritis, duodenal ulcers, visible vessel with active oozing s/p bleeding control therapy, NSAID induced gastritis  . ESOPHAGOGASTRODUODENOSCOPY N/A 08/13/2013   RMR: EGD not needed and not done  . ESOPHAGOGASTRODUODENOSCOPY  02/23/2017   [facility]: arge 4 cm, deep, cratered ulcer along the lesser curvature of the stomach but no signs of active bleeding. H. pylori stool antigen is negative.   . ESOPHAGOGASTRODUODENOSCOPY N/A 04/02/2017   Dr. Oneida Alar: normal esophagus, large non-bleeding cratered gastric ulcer, normal duodenum, secondary to aspirin   . ESOPHAGOGASTRODUODENOSCOPY N/A 07/18/2017   PUD improved but not resolved in light of NSAID use, Dieulafoy lesion of stomach and oozing gastric ulcer s/p clips and epi  .  GASTRORRHAPHY N/A 04/25/2013   Procedure: GASTRORRHAPHY;  Surgeon: Jamesetta So, MD;  Location: [facility] ORS;  Service: General;  Laterality: N/A;  . LAPAROTOMY N/A 04/25/2013   Procedure: EXPLORATORY LAPAROTOMY;  Surgeon: Jamesetta So, MD;  Location: [facility] ORS;  Service: General;  Laterality: N/A;    Allergies Codeine and Ibuprofen  Family History  Problem Relation Age of Onset  . Cancer Sister   . Colon cancer  Neg Hx     Social History Social History   Tobacco Use  . Smoking status: Current Every Day Smoker    Packs/day: 0.25    Types: Cigarettes    Start date: 01/05/1948  . Smokeless tobacco: Never Used  Substance Use Topics  . Alcohol use: No  . Drug use: No    Review of Systems  Constitutional: No fever/chills Eyes: No visual changes. ENT: No sore throat. Cardiovascular: Positive chest pain. Respiratory: Denies shortness of breath. Gastrointestinal: No abdominal pain.  No nausea, no vomiting.  No diarrhea.  No constipation. Genitourinary: Negative for dysuria. Musculoskeletal: Negative for back pain. Skin: Negative for rash. Neurological: Negative for headaches, focal weakness or numbness.  10-point ROS otherwise negative.  ____________________________________________   PHYSICAL EXAM:  VITAL SIGNS: ED Triage Vitals  Enc Vitals Group     BP 06/13/18 1749 (!) 143/92     Pulse Rate 06/13/18 1749 85     Resp --      Temp 06/13/18 1746 97.7 F (36.5 C)     Temp Source 06/13/18 1746 Oral     SpO2 06/13/18 1749 93 %     Weight 06/13/18 1747 115 lb (52.2 kg)     Pain Score 06/13/18 1747 4   Constitutional: Alert and oriented. Well appearing and in no acute distress. Very hard of hearing but able to understand with shouting.  Eyes: Conjunctivae are normal. Head: Atraumatic. Nose: No congestion/rhinnorhea. Mouth/Throat: Mucous membranes are moist.  Neck: No stridor.  Cardiovascular: Normal rate, regular rhythm. Good peripheral circulation. Grossly normal heart sounds.   Respiratory: Normal respiratory effort.  No retractions. Lungs CTAB. Gastrointestinal: Soft and nontender. No distention.  Musculoskeletal: No lower extremity tenderness nor edema. No gross deformities of extremities. Neurologic:  Normal speech and language. No gross focal neurologic deficits are appreciated.  Skin:  Skin is warm, dry and intact. No rash noted.   ____________________________________________   LABS (all labs ordered are listed, but only abnormal results are displayed)  Labs Reviewed  CBC - Abnormal; Notable for the following components:      Result Value   RBC 3.71 (*)    Hemoglobin 10.6 (*)    HCT 34.6 (*)    All other components within normal limits  CBC - Abnormal; Notable for the following components:   RBC 3.48 (*)    Hemoglobin 10.0 (*)    HCT 32.8 (*)    All other components within normal limits  SARS CORONAVIRUS 2 (HOSPITAL ORDER, [place] [facility])  BASIC METABOLIC PANEL  TROPONIN I  TROPONIN I  TROPONIN I  BASIC METABOLIC PANEL  GLUCOSE, CAPILLARY  TROPONIN I   ____________________________________________  EKG   EKG Interpretation  Date/Time:  Tuesday June 13 2018 17:52:44 EDT Ventricular Rate:  78 PR Interval:    QRS Duration: 77 QT Interval:  359 QTC Calculation: 409 R Axis:   22 Text Interpretation:  Sinus rhythm Borderline repolarization abnormality Inferior ST changes similar to prior tracings. No STEMI.  Confirmed by Nanda Quinton [phone]) on 06/13/2018  5:55:13 PM       ____________________________________________  RADIOLOGY  Dg Chest Port 1 View  Result Date: 06/13/2018 CLINICAL DATA:  Substernal chest pain EXAM: PORTABLE CHEST 1 VIEW COMPARISON:  03/12/2018, CT 02/21/2017 FINDINGS: No acute opacity or pleural effusion. Stable cardiomediastinal silhouette with aortic atherosclerosis. No pneumothorax. Biapical scarring. IMPRESSION: No active disease. Electronically Signed   By: Donavan Foil M.D.   On: 06/13/2018 18:38    ____________________________________________   PROCEDURES  Procedure(s) performed:   Procedures  None ____________________________________________   INITIAL IMPRESSION / ASSESSMENT AND PLAN / ED COURSE  Pertinent labs & imaging results that were available during my care of the patient were reviewed by me and considered in my medical decision  making (see chart for details).   Patient presents to the emergency department for evaluation of acute onset central chest pressure.  Symptoms improved with nitroglycerin.  Systolic blood pressure for EMS 200.  No evidence of acute volume overload.  No hypoxemia.  Very low suspicion for PE.  Patient has multiple risk factors for CAD. HEART score calculated in Epic of 6.   Labs reviewed. CXR negative. No CP at this time. Plan for admit for further w/u.   Discussed patient's case with Hospitalist, Dr. Myna Hidalgo to request admission. Patient and family (if present) updated with plan. Care transferred to Hospitalist service.  I reviewed all nursing notes, vitals, pertinent old records, EKGs, labs, imaging (as available).  ____________________________________________  FINAL CLINICAL IMPRESSION(S) / ED DIAGNOSES  Final diagnoses:  Precordial chest pain     MEDICATIONS GIVEN DURING THIS VISIT:  Medications  sodium chloride flush (NS) 0.9 % injection 3 mL ( Intravenous Canceled Entry 06/13/18 1913)  amLODipine (NORVASC) tablet 5 mg (5 mg Oral Given 06/14/18 0851)  atorvastatin (LIPITOR) tablet 40 mg (40 mg Oral Given 06/13/18 2226)  doxazosin (CARDURA) tablet 4 mg (4 mg Oral Given 06/14/18 0851)  losartan (COZAAR) tablet 50 mg (50 mg Oral Given 06/14/18 0851)  mirtazapine (REMERON) tablet 15 mg (15 mg Oral Given 06/13/18 2221)  budesonide (ENTOCORT EC) 24 hr capsule 6 mg (has no administration in time range)  pantoprazole (PROTONIX) EC tablet 40 mg (40 mg Oral Given 06/14/18 0851)  clopidogrel (PLAVIX) tablet 75 mg (75 mg Oral Given 06/14/18 0852)  potassium chloride SA (K-DUR) CR tablet 20 mEq (20 mEq Oral Given 06/14/18 0851)  mometasone-formoterol (DULERA) 200-5 MCG/ACT inhaler 2 puff (2 puffs Inhalation Given 06/14/18 0922)  guaiFENesin (MUCINEX) 12 hr tablet 600 mg (600 mg Oral Given 06/14/18 0850)  umeclidinium bromide (INCRUSE ELLIPTA) 62.5 MCG/INH 1 puff (1 puff Inhalation Given 06/14/18 0923)   nitroGLYCERIN (NITROSTAT) SL tablet 0.4 mg (has no administration in time range)  acetaminophen (TYLENOL) tablet 650 mg (has no administration in time range)  ondansetron (ZOFRAN) injection 4 mg (has no administration in time range)  albuterol (PROVENTIL) (2.5 MG/3ML) 0.083% nebulizer solution 3 mL (has no administration in time range)  enoxaparin (LOVENOX) injection 40 mg (40 mg Subcutaneous Given 06/13/18 2222)  sodium chloride flush (NS) 0.9 % injection 3 mL (3 mLs Intravenous Given 06/14/18 0852)  sodium chloride flush (NS) 0.9 % injection 3 mL (has no administration in time range)  0.9 %  sodium chloride infusion (has no administration in time range)  morphine 2 MG/ML injection 1-3 mg (has no administration in time range)  aspirin chewable tablet 324 mg (324 mg Oral Given 06/13/18 2221)    Or  aspirin suppository 300 mg ( Rectal See Alternative 06/13/18 2221)  Note:  This document was prepared using Dragon voice recognition software and may include unintentional dictation errors.  Nanda Quinton, MD Emergency Medicine    Long, Wonda Olds, MD 06/14/18 1000

## 2018-06-13 NOTE — H&P (Signed)
History and Physical    Jackie Walls PFX:[id number] DOB: 22-Nov-1945 DOA: 06/13/2018  PCP: Dione Housekeeper, MD   Patient coming from: Home   Chief Complaint: Chest pain   HPI: Jackie Walls is a 73 y.o. female with medical history significant for COPD with chronic hypoxic respiratory failure, hypertension, NSAID induced PUD, chronic intermittent diarrhea, and iron deficiency anemia, now presenting to the emergency department for evaluation of chest pain.  Patient complains of substernal chest pain that began approximately 1 hour prior to arrival in the ED, resulting in her calling EMS, who found the patient to have SBP of 200, and administered nitroglycerin with improvement and eventual resolution in her pain.  Patient reported that she was seated and at rest when the symptoms began, was described as a pressure sensation, persisting until after the nitroglycerin.  She has difficulty saying whether she has had this type of pain previously or not.  She denies any abdominal pain or back pain at this time, reports the pain has not returned after the nitroglycerin.  ED Course: Upon arrival to the ED, patient is found to be afebrile, saturating adequately on her usual 2 L/min of supplemental oxygen, and with remaining vitals normal.  EKG features a sinus rhythm with nonspecific ST abnormality that appears similar to prior.  Chest x-ray is negative for acute cardiopulmonary disease.  Initial troponin is undetectable.  Chemistry panel unremarkable.  CBC notable for stable normocytic anemia.  Patient remains hemodynamically stable, now chest pain-free, but will be observed for ongoing evaluation and management given her risk-factors for ACS.  Review of Systems:  All other systems reviewed and apart from HPI, are negative.  Past Medical History:  Diagnosis Date   Abdominal aortic aneurysm ([facility])    ALLERGIC RHINITIS    Anxiety    Bronchitis    Chronic diarrhea    COPD (chronic obstructive  pulmonary disease) ([facility])    Duodenal ulcer    Hyperlipidemia    Hypertension    IDA (iron deficiency anemia)    Multiple gastric ulcers    Thoracic aortic aneurysm [facility])     Past Surgical History:  Procedure Laterality Date   BIOPSY  04/02/2017   Procedure: BIOPSY;  Surgeon: Danie Binder, MD;  [facility];  Service: Endoscopy;;  gastric and gastric ulcer   CAROTID ENDARTERECTOMY Right    CESAREAN SECTION     COLONOSCOPY N/A 08/13/2013   tubular adenomas, segmental biopsies with collagenous colitis   ESOPHAGOGASTRODUODENOSCOPY N/A 05/15/2013   Dr. Oneida Alar: moderate erosive gastritis, duodenal ulcers, visible vessel with active oozing s/p bleeding control therapy, NSAID induced gastritis   ESOPHAGOGASTRODUODENOSCOPY N/A 08/13/2013   RMR: EGD not needed and not done   ESOPHAGOGASTRODUODENOSCOPY  02/23/2017   [facility]: arge 4 cm, deep, cratered ulcer along the lesser curvature of the stomach but no signs of active bleeding. H. pylori stool antigen is negative.    ESOPHAGOGASTRODUODENOSCOPY N/A 04/02/2017   Dr. Oneida Alar: normal esophagus, large non-bleeding cratered gastric ulcer, normal duodenum, secondary to aspirin    ESOPHAGOGASTRODUODENOSCOPY N/A 07/18/2017   PUD improved but not resolved in light of NSAID use, Dieulafoy lesion of stomach and oozing gastric ulcer s/p clips and epi   GASTRORRHAPHY N/A 04/25/2013   Procedure: GASTRORRHAPHY;  Surgeon: Jamesetta So, MD;  Location: [facility];  Service: General;  Laterality: N/A;   LAPAROTOMY N/A 04/25/2013   Procedure: EXPLORATORY LAPAROTOMY;  Surgeon: Jamesetta So, MD;  Location: [facility];  Service: General;  Laterality:  N/A;     reports that she has been smoking cigarettes. She started smoking about 70 years ago. She has been smoking about 0.25 packs per day. She has never used smokeless tobacco. She reports that she does not drink alcohol or use drugs.  Allergies  Allergen Reactions   Codeine     Unable to recall  reaction   Ibuprofen Other (See Comments)    History of gastric ulcers.    Family History  Problem Relation Age of Onset   Cancer Sister    Colon cancer Neg Hx      Prior to Admission medications   Medication Sig Start Date End Date Taking? Authorizing Provider  acetaminophen (TYLENOL) 325 MG tablet Take 2 tablets (650 mg total) by mouth every 6 (six) hours as needed for mild pain (or Fever >/= 101). 07/19/17   Barton Dubois, MD  albuterol (PROVENTIL) (2.5 MG/3ML) 0.083% nebulizer solution Inhale 3 mLs into the lungs as needed.  02/28/17   [provider]  alendronate (FOSAMAX) 70 MG tablet Take 70 mg by mouth once a week. Take with a full glass of water on an empty stomach. Takes on Sat.    [provider]  amLODipine (NORVASC) 5 MG tablet Take 1 tablet (5 mg total) by mouth daily. 03/13/18   Roxan Hockey, MD  atorvastatin (LIPITOR) 40 MG tablet Take 1 tablet by mouth daily. 06/06/17   [provider]  budesonide (ENTOCORT EC) 3 MG 24 hr capsule Take 2 capsules (6 mg total) by mouth daily. 05/16/18   Annitta Needs, NP  budesonide-formoterol [facility]) 160-4.5 MCG/ACT inhaler Inhale 2 puffs into the lungs as needed.     [provider]  clopidogrel (PLAVIX) 75 MG tablet Take 1 tablet (75 mg total) by mouth daily. 04/04/17   Fields, Marga Melnick, MD  dicyclomine (BENTYL) 10 MG capsule TAKE 1 CAPSULE 4 TIMES DAILY BEFORE MEALS AND AT BEDTIME AS NEEDED FOR DIARRHEA 04/27/18   Carlis Stable, NP  doxazosin (CARDURA) 4 MG tablet Take 4 mg by mouth every morning.     [provider]  guaiFENesin (MUCINEX) 600 MG 12 hr tablet Take 1 tablet (600 mg total) by mouth 2 (two) times daily. 03/13/18   Roxan Hockey, MD  HYDROcodone-acetaminophen (NORCO/VICODIN) 5-325 MG tablet Take 1 tablet by mouth 2 (two) times daily.     [provider]  losartan (COZAAR) 50 MG tablet Take 1 tablet (50 mg total) by mouth daily. 03/13/18   Roxan Hockey, MD  mirtazapine  (REMERON) 15 MG tablet Take 1 tablet by mouth at bedtime. 06/06/17   [provider]  pantoprazole (PROTONIX) 40 MG tablet Take 1 tablet (40 mg total) by mouth 2 (two) times daily. 04/04/17   Dhungel, Nishant, MD  polyethylene glycol-electrolytes (TRILYTE) 420 g solution Take 4,000 mLs by mouth as directed. 05/17/18   Rourk, Cristopher Estimable, MD  potassium chloride SA (K-DUR,KLOR-CON) 20 MEQ tablet Take 1 tablet by mouth daily.    [provider]  umeclidinium bromide (INCRUSE ELLIPTA) 62.5 MCG/INH AEPB Take 1 Inhaler by mouth daily. 07/21/17   [provider]  Vitamin D, Ergocalciferol, (DRISDOL) 1.25 MG (50000 UT) CAPS capsule Take 50,000 Units by mouth every 7 (seven) days.    [provider]    Physical Exam: Vitals:   06/13/18 1748 06/13/18 1749 06/13/18 1752 06/13/18 1830  BP: (!) 143/92 (!) 143/92  114/75  Pulse: 89 85 78 71  Resp:   15 20  Temp:  98.1 F (36.7 C)    TempSrc:  Oral    SpO2: 92% 93% 93% 95%  Weight:        Constitutional: NAD, calm  Eyes: PERTLA, lids and conjunctivae normal ENMT: Mucous membranes are moist. Posterior pharynx clear of any exudate or lesions.   Neck: normal, supple, no masses, no thyromegaly Respiratory: no wheezing, no crackles. Normal respiratory effort. No accessory muscle use.  Cardiovascular: S1 & S2 heard, regular rate and rhythm. No extremity edema.   Abdomen: No distension, no tenderness, soft. Bowel sounds active.  Musculoskeletal: no clubbing / cyanosis. No joint deformity upper and lower extremities.  Skin: no significant rashes, lesions, ulcers. Warm, dry, well-perfused. Neurologic: No gross facial asymmetry. Marked hearing deficit. Sensation intact. Moving all extremities.  Psychiatric: Alert and oriented to person, place, and communication. Calm, cooperative.    Labs on Admission: I have personally reviewed following labs and imaging studies  CBC: Recent Labs  Lab 06/13/18 1759  WBC 5.5  HGB 10.6*    HCT 34.6*  MCV 93.3  PLT 269   Basic Metabolic Panel: Recent Labs  Lab 06/13/18 1759  NA 137  K 3.5  CL 100  CO2 26  GLUCOSE 95  BUN 19  CREATININE 0.73  CALCIUM 9.1   GFR: Estimated Creatinine Clearance: 52.4 mL/min (by C-G formula based on SCr of 0.73 mg/dL). Liver Function Tests: No results for input(s): AST, ALT, ALKPHOS, BILITOT, PROT, ALBUMIN in the last 168 hours. No results for input(s): LIPASE, AMYLASE in the last 168 hours. No results for input(s): AMMONIA in the last 168 hours. Coagulation Profile: No results for input(s): INR, PROTIME in the last 168 hours. Cardiac Enzymes: Recent Labs  Lab 06/13/18 1759  TROPONINI <0.03   BNP (last 3 results) No results for input(s): PROBNP in the last 8760 hours. HbA1C: No results for input(s): HGBA1C in the last 72 hours. CBG: No results for input(s): GLUCAP in the last 168 hours. Lipid Profile: No results for input(s): CHOL, HDL, LDLCALC, TRIG, CHOLHDL, LDLDIRECT in the last 72 hours. Thyroid Function Tests: No results for input(s): TSH, T4TOTAL, FREET4, T3FREE, THYROIDAB in the last 72 hours. Anemia Panel: No results for input(s): VITAMINB12, FOLATE, FERRITIN, TIBC, IRON, RETICCTPCT in the last 72 hours. Urine analysis:    Component Value Date/Time   COLORURINE YELLOW 04/01/2017 1141   APPEARANCEUR CLEAR 04/01/2017 1141   LABSPEC 1.006 04/01/2017 1141   PHURINE 7.0 04/01/2017 1141   GLUCOSEU NEGATIVE 04/01/2017 1141   HGBUR NEGATIVE 04/01/2017 1141   BILIRUBINUR NEGATIVE 04/01/2017 1141   KETONESUR NEGATIVE 04/01/2017 1141   PROTEINUR NEGATIVE 04/01/2017 1141   UROBILINOGEN 0.2 05/13/2013 1304   NITRITE NEGATIVE 04/01/2017 1141   LEUKOCYTESUR TRACE (A) 04/01/2017 1141   Sepsis Labs: @LABRCNTIP (procalcitonin:4,lacticidven:4) )No results found for this or any previous visit (from the past 240 hour(s)).   Radiological Exams on Admission: Dg Chest Port 1 View  Result Date: 06/13/2018 CLINICAL DATA:   Substernal chest pain EXAM: PORTABLE CHEST 1 VIEW COMPARISON:  03/12/2018, CT 02/21/2017 FINDINGS: No acute opacity or pleural effusion. Stable cardiomediastinal silhouette with aortic atherosclerosis. No pneumothorax. Biapical scarring. IMPRESSION: No active disease. Electronically Signed   By: Donavan Foil M.D.   On: 06/13/2018 18:38    EKG: Independently reviewed. Sinus rhythm, non-specific ST abnormalities are similar to prior.   Assessment/Plan   1. Chest pain  - Presents after developing substernal chest pressure while at rest, resolving after NTG with EMS  - EKG features ST abnormalities most  notable in inferior leads but not significantly different than prior  - CXR with no acute findings  - Initial troponin <0.03  - There is no known CAD, but significant RF's  - Treat with ASA 324, trend troponin and EKG, continue cardiac monitoring, continue Lipitor and Plavix    2. COPD with chronic hypoxic respiratory failure  - Stable  - Continue supplemental O2, ICS/LABA, LAMA, and as-needed albuterol   3. Hypertension  - SBP was 200 with EMS, came down with NTG pta and has been stable in ED  - Continue Norvasc, losartan    4. PUD  - She is no longer taking NSAIDs, using PPI BID, will continue    5. Chronic diarrhea; collagenous colitis  - Follows with GI, had collagenous colitis on CT in 2015, started on Entocort last month  - Appears to be stable, abdominal exam benign, no diarrhea in ED    6. Anemia  - Hgb is stable on admission with no active bleeding  - Patient is following with GI and planned for colonoscopy     PPE: Mask, face shield  DVT prophylaxis: Lovenox  Code Status: Full  Family Communication: Discussed with patient  Consults called: None Admission status: Observation     Vianne Bulls, MD [facility] Pager [phone]  If 7PM-7AM, please contact night-coverage [email or web address] Password [email or web address]  06/13/2018, 8:02 PM

## 2018-06-13 NOTE — ED Notes (Signed)
Pt given water 

## 2018-06-14 ENCOUNTER — Observation Stay (HOSPITAL_BASED_OUTPATIENT_CLINIC_OR_DEPARTMENT_OTHER): Payer: Medicare Other

## 2018-06-14 ENCOUNTER — Ambulatory Visit: Payer: Medicare Other | Admitting: Neurology

## 2018-06-14 DIAGNOSIS — J9611 Chronic respiratory failure with hypoxia: Secondary | ICD-10-CM | POA: Diagnosis not present

## 2018-06-14 DIAGNOSIS — J449 Chronic obstructive pulmonary disease, unspecified: Secondary | ICD-10-CM

## 2018-06-14 DIAGNOSIS — R079 Chest pain, unspecified: Secondary | ICD-10-CM

## 2018-06-14 DIAGNOSIS — I10 Essential (primary) hypertension: Secondary | ICD-10-CM | POA: Diagnosis not present

## 2018-06-14 DIAGNOSIS — R0789 Other chest pain: Secondary | ICD-10-CM | POA: Diagnosis not present

## 2018-06-14 LAB — ECHOCARDIOGRAM COMPLETE
Height: 65 in
Weight: 1679.02 oz

## 2018-06-14 LAB — CBC
HCT: 32.8 % — ABNORMAL LOW (ref 36.0–46.0)
Hemoglobin: 10 g/dL — ABNORMAL LOW (ref 12.0–15.0)
MCH: 28.7 pg (ref 26.0–34.0)
MCHC: 30.5 g/dL (ref 30.0–36.0)
MCV: 94.3 fL (ref 80.0–100.0)
Platelets: 276 10*3/uL (ref 150–400)
RBC: 3.48 MIL/uL — ABNORMAL LOW (ref 3.87–5.11)
RDW: 14.4 % (ref 11.5–15.5)
WBC: 8.4 10*3/uL (ref 4.0–10.5)
nRBC: 0 % (ref 0.0–0.2)

## 2018-06-14 LAB — BASIC METABOLIC PANEL
Anion gap: 12 (ref 5–15)
BUN: 19 mg/dL (ref 8–23)
CO2: 25 mmol/L (ref 22–32)
Calcium: 9.1 mg/dL (ref 8.9–10.3)
Chloride: 101 mmol/L (ref 98–111)
Creatinine, Ser: 0.72 mg/dL (ref 0.44–1.00)
GFR calc Af Amer: >60 mL/min (ref >60)
GFR calc non Af Amer: >60 mL/min (ref >60)
Glucose, Bld: 88 mg/dL (ref 70–99)
Potassium: 3.6 mmol/L (ref 3.5–5.1)
Sodium: 138 mmol/L (ref 135–145)

## 2018-06-14 LAB — TROPONIN I
Troponin I: <0.03 ng/mL (ref <0.03)
Troponin I: <0.03 ng/mL (ref <0.03)

## 2018-06-14 LAB — GLUCOSE, CAPILLARY
Glucose-Capillary: 77 mg/dL (ref 70–99)
Glucose-Capillary: 122 mg/dL — ABNORMAL HIGH (ref 70–99)

## 2018-06-14 MED ORDER — DOXAZOSIN MESYLATE 4 MG PO TABS: 4.0000 mg | ORAL_TABLET | Freq: Every morning | ORAL | 0 refills | Status: AC

## 2018-06-14 MED ORDER — PANTOPRAZOLE SODIUM 40 MG PO TBEC: 40.0000 mg | DELAYED_RELEASE_TABLET | Freq: Two times a day (BID) | ORAL | 1 refills | Status: AC

## 2018-06-14 NOTE — Plan of Care (Signed)
  Problem: Education: Goal: Knowledge of General Education information will improve Description Including pain rating scale, medication(s)/side effects and non-pharmacologic comfort measures 06/14/2018 1609 by Rance Muir, RN Outcome: Adequate for Discharge 06/14/2018 1130 by Rance Muir, RN Outcome: Progressing   Problem: Health Behavior/Discharge Planning: Goal: Ability to manage health-related needs will improve 06/14/2018 1609 by Rance Muir, RN Outcome: Adequate for Discharge 06/14/2018 1130 by Rance Muir, RN Outcome: Progressing   Problem: Clinical Measurements: Goal: Ability to maintain clinical measurements within normal limits will improve 06/14/2018 1609 by Rance Muir, RN Outcome: Adequate for Discharge 06/14/2018 1130 by Rance Muir, RN Outcome: Progressing Goal: Will remain free from infection 06/14/2018 1609 by Rance Muir, RN Outcome: Adequate for Discharge 06/14/2018 1130 by Rance Muir, RN Outcome: Progressing Goal: Diagnostic test results will improve 06/14/2018 1609 by Rance Muir, RN Outcome: Adequate for Discharge 06/14/2018 1130 by Rance Muir, RN Outcome: Progressing Goal: Respiratory complications will improve 06/14/2018 1609 by Rance Muir, RN Outcome: Adequate for Discharge 06/14/2018 1130 by Rance Muir, RN Outcome: Progressing Goal: Cardiovascular complication will be avoided 06/14/2018 1609 by Rance Muir, RN Outcome: Adequate for Discharge 06/14/2018 1130 by Rance Muir, RN Outcome: Progressing   Problem: Activity: Goal: Risk for activity intolerance will decrease 06/14/2018 1609 by Rance Muir, RN Outcome: Adequate for Discharge 06/14/2018 1130 by Rance Muir, RN Outcome: Progressing   Problem: Clinical Measurements: Goal: Ability to maintain clinical measurements within normal limits will improve 06/14/2018 1609 by Rance Muir, RN Outcome: Adequate for Discharge 06/14/2018 1130 by Rance Muir, RN Outcome: Progressing Goal: Will remain free from infection 06/14/2018 1609 by Rance Muir, RN Outcome: Adequate for Discharge 06/14/2018 1130 by Rance Muir, RN Outcome: Progressing Goal: Diagnostic test results will improve 06/14/2018 1609 by Rance Muir, RN Outcome: Adequate for Discharge 06/14/2018 1130 by Rance Muir, RN Outcome: Progressing Goal: Respiratory complications will improve 06/14/2018 1609 by Rance Muir, RN Outcome: Adequate for Discharge 06/14/2018 1130 by Rance Muir, RN Outcome: Progressing Goal: Cardiovascular complication will be avoided 06/14/2018 1609 by Rance Muir, RN Outcome: Adequate for Discharge 06/14/2018 1130 by Rance Muir, RN Outcome: Progressing   Problem: Activity: Goal: Risk for activity intolerance will decrease 06/14/2018 1609 by Rance Muir, RN Outcome: Adequate for Discharge 06/14/2018 1130 by Rance Muir, RN Outcome: Progressing   Problem: Nutrition: Goal: Adequate nutrition will be maintained 06/14/2018 1609 by Rance Muir, RN Outcome: Adequate for Discharge 06/14/2018 1130 by Rance Muir, RN Outcome: Progressing   Problem: Coping: Goal: Level of anxiety will decrease 06/14/2018 1609 by Rance Muir, RN Outcome: Adequate for Discharge 06/14/2018 1130 by Rance Muir, RN Outcome: Progressing   Problem: Elimination: Goal: Will not experience complications related to bowel motility 06/14/2018 1609 by Rance Muir, RN Outcome: Adequate for Discharge 06/14/2018 1130 by Rance Muir, RN Outcome: Progressing Goal: Will not experience complications related to urinary retention 06/14/2018 1609 by Rance Muir, RN Outcome: Adequate for Discharge 06/14/2018 1130 by Rance Muir, RN Outcome: Progressing   Problem: Pain Managment: Goal: General experience of comfort will improve 06/14/2018 1609 by Rance Muir, RN Outcome: Adequate for Discharge 06/14/2018 1130 by Rance Muir, RN Outcome: Progressing   Problem: Safety: Goal: Ability to remain free from injury will improve 06/14/2018 1609 by Rance Muir, RN Outcome: Adequate for Discharge 06/14/2018 1130 by Rance Muir,  RN Outcome: Progressing   Problem: Skin Integrity: Goal: Risk for impaired skin integrity will decrease 06/14/2018 1609 by Rance Muir, RN Outcome: Adequate for Discharge 06/14/2018 1130 by Rance Muir, RN Outcome: Progressing

## 2018-06-14 NOTE — Discharge Instructions (Signed)

## 2018-06-14 NOTE — Plan of Care (Signed)

## 2018-06-14 NOTE — Care Management Obs Status (Signed)
[facility] NOTIFICATION   Patient Details  Name: Jackie Walls MRN: [id number] Date of Birth: 1945-08-27   Medicare Observation Status Notification Given:  Yes(spoke with daughter by phone and placed a copy of observation information at patient's bedside.)    Tommy Medal 06/14/2018, 1:57 PM

## 2018-06-14 NOTE — Discharge Summary (Signed)
Physician Discharge Summary  Jackie Walls IWL:[id number] DOB: January 27, 1945 DOA: 06/13/2018  PCP: Dione Housekeeper, MD  Admit date: 06/13/2018 Discharge date: 06/14/2018  Time spent: 45 minutes  Recommendations for Outpatient Follow-up:  Patient will be discharged to home.  Patient will need to follow up with primary care provider within one week of discharge.  Follow up with cardiology.  Patient should continue medications as prescribed.  Patient should follow a heart healthy diet.   Discharge Diagnoses:  Chest pain COPD with chronic hypoxic respiratory failure Essential hypertension/urgency Peptic ulcer disease Chronic diarrhea/collagenous colitis Chronic normocytic anemia  Discharge Condition: Stable  Diet recommendation: heart healthy  Filed Weights   06/13/18 1747 06/13/18 2202  Weight: 52.2 kg 47.6 kg    History of present illness:  On 06/13/2018 by Dr. Christia Reading Opyd Jackie Walls is a 73 y.o. female with medical history significant for COPD with chronic hypoxic respiratory failure, hypertension, NSAID induced PUD, chronic intermittent diarrhea, and iron deficiency anemia, now presenting to the emergency department for evaluation of chest pain.  Patient complains of substernal chest pain that began approximately 1 hour prior to arrival in the ED, resulting in her calling EMS, who found the patient to have SBP of 200, and administered nitroglycerin with improvement and eventual resolution in her pain.  Patient reported that she was seated and at rest when the symptoms began, was described as a pressure sensation, persisting until after the nitroglycerin.  She has difficulty saying whether she has had this type of pain previously or not.  She denies any abdominal pain or back pain at this time, reports the pain has not returned after the nitroglycerin.  Hospital Course:  Chest pain -Patient had substernal chest pain at rest, which resolved with nitroglycerin -EKG showed ST  abnormalities in the inferior leads  -Troponin cycled and unremarkable -Chest x-ray unremarkable for acute findings -Cardiology consulted and appreciated, recommended cardiogram and medical management -Echocardiogram EF 60-65%. LV diastolic parameters are consistent with impaired relaxation. No evidence of LV regional wall motion abnormalities  -Continue statin, Plavix, cardura  COPD with chronic hypoxic respiratory failure -Stable, continue supplemental oxygen and inhalers as needed  Essential hypertension/urgency -Patient presented with SBP 200 with EMS, however having NTG pta, BP normalized -Continue amlodipine, losartan -Discussed with cardiology, will restart cardura  Peptic ulcer disease -Continue PPI -Avoid NSAIDs  Chronic diarrhea/collagenous colitis -Patient follows with gastroenterology, was noted to have collagenous colitis on CT scan in 2015 and started on Entocort last month -Currently stable  Normocytic anemia -Hemoglobin currently 10, appears to be stable  Procedures: Echocardiogram  Consultations: Cardiology  Discharge Exam: Vitals:   06/14/18 0924 06/14/18 1548  BP:  (!) 161/73  Pulse:  70  Resp:  17  Temp:  97.9 F (36.6 C)  SpO2: 98% 100%     General: Well developed, well nourished, NAD, appears stated age  HEENT: NCAT, mucous membranes moist.  Neck: Supple  Cardiovascular: S1 S2 auscultated, RRR, 2/6 SEM  Respiratory: Clear to auscultation bilaterally with equal chest rise  Abdomen: Soft, nontender, nondistended, + bowel sounds  Extremities: warm dry without cyanosis clubbing or edema  Neuro: AAOx3, hard of hearing, otherwise nonfocal  Psych: Normal affect and demeanor   Discharge Instructions Discharge Instructions    Discharge instructions   Complete by:  As directed    Patient will be discharged to home.  Patient will need to follow up with primary care provider within one week of discharge.  Follow up with cardiology.  Patient  should continue medications as prescribed.  Patient should follow a heart healthy diet.     Allergies as of 06/14/2018      Reactions   Codeine    Unable to recall reaction   Ibuprofen Other (See Comments)   History of gastric ulcers.      Medication List    TAKE these medications   acetaminophen 325 MG tablet Commonly known as:  TYLENOL Take 2 tablets (650 mg total) by mouth every 6 (six) hours as needed for mild pain (or Fever >/= 101).   ProAir HFA 108 (90 Base) MCG/ACT inhaler Generic drug:  albuterol Inhale 1-2 puffs into the lungs every 6 (six) hours as needed for wheezing or shortness of breath.   albuterol (2.5 MG/3ML) 0.083% nebulizer solution Commonly known as:  PROVENTIL Inhale 3 mLs into the lungs daily as needed for wheezing or shortness of breath.   amLODipine 5 MG tablet Commonly known as:  NORVASC Take 1 tablet (5 mg total) by mouth daily. What changed:    how much to take  when to take this   atorvastatin 40 MG tablet Commonly known as:  LIPITOR Take 1 tablet by mouth every evening.   budesonide 3 MG 24 hr capsule Commonly known as:  ENTOCORT EC Take 2 capsules (6 mg total) by mouth daily. What changed:    how much to take  when to take this   budesonide-formoterol 160-4.5 MCG/ACT inhaler Commonly known as:  SYMBICORT Inhale 2 puffs into the lungs 2 (two) times a day.   clopidogrel 75 MG tablet Commonly known as:  PLAVIX Take 1 tablet (75 mg total) by mouth daily. What changed:  when to take this   dicyclomine 10 MG capsule Commonly known as:  BENTYL TAKE 1 CAPSULE 4 TIMES DAILY BEFORE MEALS AND AT BEDTIME AS NEEDED FOR DIARRHEA What changed:  See the new instructions.   diphenhydrAMINE 25 mg capsule Commonly known as:  BENADRYL Take 50 mg by mouth at bedtime.   doxazosin 4 MG tablet Commonly known as:  CARDURA Take 1 tablet (4 mg total) by mouth every morning.   gabapentin 100 MG capsule Commonly known as:  NEURONTIN Take 100 mg  by mouth at bedtime.   HYDROcodone-acetaminophen 5-325 MG tablet Commonly known as:  NORCO/VICODIN Take 1 tablet by mouth 2 (two) times daily.   Incruse Ellipta 62.5 MCG/INH Aepb Generic drug:  umeclidinium bromide Take 1 Inhaler by mouth every evening.   losartan 50 MG tablet Commonly known as:  COZAAR Take 1 tablet (50 mg total) by mouth daily.   mirtazapine 15 MG tablet Commonly known as:  REMERON Take 1 tablet by mouth at bedtime.   pantoprazole 40 MG tablet Commonly known as:  PROTONIX Take 1 tablet (40 mg total) by mouth 2 (two) times daily.   polyethylene glycol-electrolytes 420 g solution Commonly known as:  TriLyte Take 4,000 mLs by mouth as directed.   potassium chloride SA 20 MEQ tablet Commonly known as:  K-DUR Take 1 tablet by mouth every evening.   VICKS VAPOR INHALER IN Inhale 1 spray into the lungs daily as needed (FOR CONGESTION).   Vitamin D (Ergocalciferol) 1.25 MG (50000 UT) Caps capsule Commonly known as:  DRISDOL Take 50,000 Units by mouth every Monday.      Allergies  Allergen Reactions  . Codeine     Unable to recall reaction  . Ibuprofen Other (See Comments)    History of gastric ulcers.   Follow-up Information  Dione Housekeeper, MD. Schedule an appointment as soon as possible for a visit in 1 week(s).   Specialty:  Family Medicine Why:  Hospital follow up Contact information: [place] [address] [phone]            The results of significant diagnostics from this hospitalization (including imaging, microbiology, ancillary and laboratory) are listed below for reference.    Significant Diagnostic Studies: Dg Chest Port 1 View  Result Date: 06/13/2018 CLINICAL DATA:  Substernal chest pain EXAM: PORTABLE CHEST 1 VIEW COMPARISON:  03/12/2018, CT 02/21/2017 FINDINGS: No acute opacity or pleural effusion. Stable cardiomediastinal silhouette with aortic atherosclerosis. No pneumothorax. Biapical scarring.  IMPRESSION: No active disease. Electronically Signed   By: Donavan Foil M.D.   On: 06/13/2018 18:38    Microbiology: Recent Results (from the past 240 hour(s))  SARS Coronavirus 2 (CEPHEID - Performed in [facility] hospital lab), Hosp Order     Status: None   Collection Time: 06/13/18  6:43 PM  Result Value Ref Range Status   SARS Coronavirus 2 NEGATIVE NEGATIVE Final    Comment: (NOTE) If result is NEGATIVE SARS-CoV-2 target nucleic acids are NOT DETECTED. The SARS-CoV-2 RNA is generally detectable in upper and lower  respiratory specimens during the acute phase of infection. The lowest  concentration of SARS-CoV-2 viral copies this assay can detect is 250  copies / mL. A negative result does not preclude SARS-CoV-2 infection  and should not be used as the sole basis for treatment or other  patient management decisions.  A negative result may occur with  improper specimen collection / handling, submission of specimen other  than nasopharyngeal swab, presence of viral mutation(s) within the  areas targeted by this assay, and inadequate number of viral copies  (<250 copies / mL). A negative result must be combined with clinical  observations, patient history, and epidemiological information. If result is POSITIVE SARS-CoV-2 target nucleic acids are DETECTED. The SARS-CoV-2 RNA is generally detectable in upper and lower  respiratory specimens dur ing the acute phase of infection.  Positive  results are indicative of active infection with SARS-CoV-2.  Clinical  correlation with patient history and other diagnostic information is  necessary to determine patient infection status.  Positive results do  not rule out bacterial infection or co-infection with other viruses. If result is PRESUMPTIVE POSTIVE SARS-CoV-2 nucleic acids MAY BE PRESENT.   A presumptive positive result was obtained on the submitted specimen  and confirmed on repeat testing.  While 2019 novel coronavirus   (SARS-CoV-2) nucleic acids may be present in the submitted sample  additional confirmatory testing may be necessary for epidemiological  and / or clinical management purposes  to differentiate between  SARS-CoV-2 and other Sarbecovirus currently known to infect humans.  If clinically indicated additional testing with an alternate test  methodology [phone]) is advised. The SARS-CoV-2 RNA is generally  detectable in upper and lower respiratory sp ecimens during the acute  phase of infection. The expected result is Negative. Fact Sheet for Patients:  StrictlyIdeas.no Fact Sheet for Healthcare Providers: BankingDealers.co.za This test is not yet approved or cleared by the [place] FDA and has been authorized for detection and/or diagnosis of SARS-CoV-2 by FDA under an Emergency Use Authorization (EUA).  This EUA will remain in effect (meaning this test can be used) for the duration of the COVID-19 declaration under Section 564(b)(1) of the Act, 21 U.S.C. section 360bbb-3(b)(1), unless the authorization is terminated or revoked sooner. Performed at [facility]  [facility], [address] [place], [place] [address]      Labs: Basic Metabolic Panel: Recent Labs  Lab 06/13/18 1759 06/14/18 0349  NA 137 138  K 3.5 3.6  CL 100 101  CO2 26 25  GLUCOSE 95 88  BUN 19 19  CREATININE 0.73 0.72  CALCIUM 9.1 9.1   Liver Function Tests: No results for input(s): AST, ALT, ALKPHOS, BILITOT, PROT, ALBUMIN in the last 168 hours. No results for input(s): LIPASE, AMYLASE in the last 168 hours. No results for input(s): AMMONIA in the last 168 hours. CBC: Recent Labs  Lab 06/13/18 1759 06/14/18 0349  WBC 5.5 8.4  HGB 10.6* 10.0*  HCT 34.6* 32.8*  MCV 93.3 94.3  PLT 313 276   Cardiac Enzymes: Recent Labs  Lab 06/13/18 1759 06/13/18 2217 06/14/18 0349 06/14/18 0947  TROPONINI <0.03 <0.03 <0.03 <0.03   BNP: BNP (last 3 results) No results  for input(s): BNP in the last 8760 hours.  ProBNP (last 3 results) No results for input(s): PROBNP in the last 8760 hours.  CBG: Recent Labs  Lab 06/14/18 0815 06/14/18 1205  GLUCAP 77 122*       Signed:  Kashae Carstens  [facility] 06/14/2018, 4:07 PM

## 2018-06-14 NOTE — Progress Notes (Signed)
*  PRELIMINARY RESULTS* Echocardiogram 2D Echocardiogram has been performed.  Jackie Walls 06/14/2018, 11:43 AM

## 2018-06-14 NOTE — Consult Note (Addendum)
Cardiology Consult    Patient ID: Jackie Walls; [id number]; 07/24/1945   Admit date: 06/13/2018 Date of Consult: 06/14/2018  Primary Care Provider: Dione Housekeeper, MD Primary Cardiologist: New to [facility] - Dr. Domenic Polite  Patient Profile    Jackie Walls is a 73 y.o. female with past medical history of carotid artery stenosis (s/p prior CEA), COPD, chronic hypoxic respiratory failure (on 2L [place] at baseline), HTN, AAA (at 4.6 cm by imaging in 2019), and hard of hearing who is being seen today for the evaluation of chest pain at the request of Dr. Ree Kida.   History of Present Illness    Jackie Walls presented to [facility] ED on 06/13/2018 for evaluation of chest discomfort. SBP was elevated to greater than 200 upon EMS arrival and sublingual nitroglycerin was administered with improvement in her blood pressure and chest discomfort.  In talking with the patient today, obtaining history is quite difficult in the setting of her being hard of hearing. She reports being in her usual state of health until yesterday morning when she developed chest pain. Says this was constant through most of the day and resolved yesterday evening.  Was not worse with exertion or positional changes. She denies any recurrent episodes of chest pain overnight or this morning. Her biggest concern now is that she is hungry and wants to eat. She denies any recent orthopnea, PND, or lower extremity edema.  She denies any prior cardiac history. Did have prior CEA by review of notes. Unaware of any family history of CAD. Ambulates with a walker. Lives with her daughter and has an in-home CNA present several days throughout the week to assist with ADL's.   Initial labs showed WBC 5.5, Hgb 10.6, platelets 313, Na+ 137, K+ 3.5, and creatinine 0.73.  COVID testing negative. Initial and cyclic troponin values have been negative. CXR with no active cardiopulmonary disease. Initial EKG showing NSR, HR 78, with slight ST depression along  the inferior leads which is similar to prior tracings. Repeat this AM shows NSR with nonspecific IVCD but LVH is more significantly pronounced with ST abnormalities along inferior and anterior leads.     Past Medical History:  Diagnosis Date  . Abdominal aortic aneurysm ([place])   . ALLERGIC RHINITIS   . Anxiety   . Bronchitis   . Chronic diarrhea   . COPD (chronic obstructive pulmonary disease) ([place])   . Duodenal ulcer   . Hyperlipidemia   . Hypertension   . IDA (iron deficiency anemia)   . Multiple gastric ulcers   . Thoracic aortic aneurysm [facility])     Past Surgical History:  Procedure Laterality Date  . BIOPSY  04/02/2017   Procedure: BIOPSY;  Surgeon: Danie Binder, MD;  Location: [facility] ENDO SUITE;  Service: Endoscopy;;  gastric and gastric ulcer  . CAROTID ENDARTERECTOMY Right   . CESAREAN SECTION    . COLONOSCOPY N/A 08/13/2013   tubular adenomas, segmental biopsies with collagenous colitis  . ESOPHAGOGASTRODUODENOSCOPY N/A 05/15/2013   Dr. Oneida Alar: moderate erosive gastritis, duodenal ulcers, visible vessel with active oozing s/p bleeding control therapy, NSAID induced gastritis  . ESOPHAGOGASTRODUODENOSCOPY N/A 08/13/2013   RMR: EGD not needed and not done  . ESOPHAGOGASTRODUODENOSCOPY  02/23/2017   [facility]: arge 4 cm, deep, cratered ulcer along the lesser curvature of the stomach but no signs of active bleeding. H. pylori stool antigen is negative.   . ESOPHAGOGASTRODUODENOSCOPY N/A 04/02/2017   Dr. Oneida Alar: normal esophagus, large non-bleeding cratered gastric ulcer,  normal duodenum, secondary to aspirin   . ESOPHAGOGASTRODUODENOSCOPY N/A 07/18/2017   PUD improved but not resolved in light of NSAID use, [place] lesion of stomach and oozing gastric ulcer s/p clips and epi  . GASTRORRHAPHY N/A 04/25/2013   Procedure: GASTRORRHAPHY;  Surgeon: Jamesetta So, MD;  Location: [facility] ORS;  Service: General;  Laterality: N/A;  . LAPAROTOMY N/A 04/25/2013   Procedure: EXPLORATORY LAPAROTOMY;   Surgeon: Jamesetta So, MD;  Location: [facility] ORS;  Service: General;  Laterality: N/A;     Home Medications:  Prior to Admission medications   Medication Sig Start Date End Date Taking? Authorizing Provider  acetaminophen (TYLENOL) 325 MG tablet Take 2 tablets (650 mg total) by mouth every 6 (six) hours as needed for mild pain (or Fever >/= 101). 07/19/17  Yes Barton Dubois, MD  albuterol (PROAIR HFA) 108 (90 Base) MCG/ACT inhaler Inhale 1-2 puffs into the lungs every 6 (six) hours as needed for wheezing or shortness of breath.   Yes [provider]  albuterol (PROVENTIL) (2.5 MG/3ML) 0.083% nebulizer solution Inhale 3 mLs into the lungs daily as needed for wheezing or shortness of breath.  02/28/17  Yes [provider]  amLODipine (NORVASC) 5 MG tablet Take 1 tablet (5 mg total) by mouth daily. Patient taking differently: Take 10 mg by mouth every morning.  03/13/18  Yes Emokpae, Courage, MD  Aromatic Inhalants (VICKS VAPOR INHALER IN) Inhale 1 spray into the lungs daily as needed (FOR CONGESTION).   Yes [provider]  atorvastatin (LIPITOR) 40 MG tablet Take 1 tablet by mouth every evening.  06/06/17  Yes [provider]  budesonide (ENTOCORT EC) 3 MG 24 hr capsule Take 2 capsules (6 mg total) by mouth daily. Patient taking differently: Take 3 mg by mouth 2 (two) times a day.  05/16/18  Yes Annitta Needs, NP  budesonide-formoterol (SYMBICORT) 160-4.5 MCG/ACT inhaler Inhale 2 puffs into the lungs 2 (two) times a day.    Yes [provider]  clopidogrel (PLAVIX) 75 MG tablet Take 1 tablet (75 mg total) by mouth daily. Patient taking differently: Take 75 mg by mouth every morning.  04/04/17  Yes Fields, Marga Melnick, MD  dicyclomine (BENTYL) 10 MG capsule TAKE 1 CAPSULE 4 TIMES DAILY BEFORE MEALS AND AT BEDTIME AS NEEDED FOR DIARRHEA Patient taking differently: Take 10 mg by mouth 4 (four) times daily -  before meals and at bedtime.  04/27/18  Yes Carlis Stable, NP   diphenhydrAMINE (BENADRYL) 25 mg capsule Take 50 mg by mouth at bedtime.   Yes [provider]  gabapentin (NEURONTIN) 100 MG capsule Take 100 mg by mouth at bedtime.   Yes [provider]  HYDROcodone-acetaminophen (NORCO/VICODIN) 5-325 MG tablet Take 1 tablet by mouth 2 (two) times daily.    Yes [provider]  losartan (COZAAR) 50 MG tablet Take 1 tablet (50 mg total) by mouth daily. 03/13/18  Yes Emokpae, Courage, MD  mirtazapine (REMERON) 15 MG tablet Take 1 tablet by mouth at bedtime. 06/06/17  Yes [provider]  potassium chloride SA (K-DUR,KLOR-CON) 20 MEQ tablet Take 1 tablet by mouth every evening.    Yes [provider]  umeclidinium bromide (INCRUSE ELLIPTA) 62.5 MCG/INH AEPB Take 1 Inhaler by mouth every evening.  07/21/17  Yes [provider]  Vitamin D, Ergocalciferol, (DRISDOL) 1.25 MG (50000 UT) CAPS capsule Take 50,000 Units by mouth every Monday.    Yes [provider]  polyethylene glycol-electrolytes (TRILYTE) 420  g solution Take 4,000 mLs by mouth as directed. 05/17/18   Daneil Dolin, MD    Inpatient Medications: Scheduled Meds: . amLODipine  5 mg Oral Daily  . atorvastatin  40 mg Oral q1800  . budesonide  6 mg Oral Daily  . clopidogrel  75 mg Oral Daily  . doxazosin  4 mg Oral q morning - 10a  . enoxaparin (LOVENOX) injection  40 mg Subcutaneous Q24H  . guaiFENesin  600 mg Oral BID  . losartan  50 mg Oral Daily  . mirtazapine  15 mg Oral QHS  . mometasone-formoterol  2 puff Inhalation BID  . pantoprazole  40 mg Oral BID  . potassium chloride SA  20 mEq Oral Daily  . sodium chloride flush  3 mL Intravenous Once  . sodium chloride flush  3 mL Intravenous Q12H  . umeclidinium bromide  1 puff Inhalation Daily   Continuous Infusions: . sodium chloride     PRN Meds: sodium chloride, acetaminophen, albuterol, morphine injection, nitroGLYCERIN, ondansetron (ZOFRAN) IV, sodium chloride flush  Allergies:     Allergies  Allergen Reactions  . Codeine     Unable to recall reaction  . Ibuprofen Other (See Comments)    History of gastric ulcers.    Social History:   Social History   Socioeconomic History  . Marital status: Widowed    Spouse name: Not on file  . Number of children: Not on file  . Years of education: Not on file  . Highest education level: Not on file  Occupational History  . Not on file  Social Needs  . Financial resource strain: Not on file  . Food insecurity:    Worry: Not on file    Inability: Not on file  . Transportation needs:    Medical: Not on file    Non-medical: Not on file  Tobacco Use  . Smoking status: Current Every Day Smoker    Packs/day: 0.25    Types: Cigarettes    Start date: 01/05/1948  . Smokeless tobacco: Never Used  Substance and Sexual Activity  . Alcohol use: No  . Drug use: No  . Sexual activity: Not on file  Lifestyle  . Physical activity:    Days per week: Not on file    Minutes per session: Not on file  . Stress: Not on file  Relationships  . Social connections:    Talks on phone: Not on file    Gets together: Not on file    Attends religious service: Not on file    Active member of club or organization: Not on file    Attends meetings of clubs or organizations: Not on file    Relationship status: Not on file  . Intimate partner violence:    Fear of current or ex partner: Not on file    Emotionally abused: Not on file    Physically abused: Not on file    Forced sexual activity: Not on file  Other Topics Concern  . Not on file  Social History Narrative   Lives with her husband at home.     Family History:    Family History  Problem Relation Age of Onset  . Cancer Sister   . Colon cancer Neg Hx       Review of Systems    General:  No chills, fever, night sweats or weight changes.  Cardiovascular:  No edema, orthopnea, palpitations, paroxysmal nocturnal dyspnea. Positive for chest pain and dyspnea on exertion (at  baseline).  Dermatological: No rash, lesions/masses Respiratory: No cough, Positive for dyspnea Urologic: No hematuria, dysuria Abdominal:   No nausea, vomiting, diarrhea, bright red blood per rectum, melena, or hematemesis Neurologic:  No visual changes, wkns, changes in mental status. All other systems reviewed and are otherwise negative except as noted above.  Physical Exam/Data    Vitals:   06/13/18 2202 06/13/18 2209 06/14/18 0527 06/14/18 0604  BP: 120/88  124/75   Pulse: 74  70   Resp: (!) 28  20   Temp: 98.2 F (36.8 C)  98.5 F (36.9 C)   TempSrc: Oral  Oral   SpO2: 98%  99% 95%  Weight: 47.6 kg     Height:  5\' 5"  (1.651 m)      Intake/Output Summary (Last 24 hours) at 06/14/2018 0900 Last data filed at 06/13/2018 2200 Gross per 24 hour  Intake -  Output 350 ml  Net -350 ml   Filed Weights   06/13/18 1747 06/13/18 2202  Weight: 52.2 kg 47.6 kg   Body mass index is 17.46 kg/m.   General: Pleasant, elderly Caucasian female appearing in NAD Psych: Normal affect. Neuro: Alert and oriented X 3. Moves all extremities spontaneously. HEENT: Normal  Neck: Supple without bruits or JVD. Lungs:  Resp regular and unlabored, CTA without wheezing or rales. On 2L [place]. Heart: RRR no s3, s4, 2/6 SEM along RUSB.  Abdomen: Soft, non-tender, non-distended, BS + x 4.  Extremities: No clubbing, cyanosis or edema. DP/PT/Radials 2+ and equal bilaterally.   EKG:  The EKG was personally reviewed and demonstrates: As above.  Telemetry:  Telemetry was personally reviewed and demonstrates: NSR, HR in 60's to 80's with occasional PVC's.    Labs/Studies     Relevant CV Studies:  No prior studies available for review.   Laboratory Data:  Chemistry Recent Labs  Lab 06/13/18 1759 06/14/18 0349  NA 137 138  K 3.5 3.6  CL 100 101  CO2 26 25  GLUCOSE 95 88  BUN 19 19  CREATININE 0.73 0.72  CALCIUM 9.1 9.1  GFRNONAA >60 >60  GFRAA >60 >60  ANIONGAP 11 12    No results for  input(s): PROT, ALBUMIN, AST, ALT, ALKPHOS, BILITOT in the last 168 hours. Hematology Recent Labs  Lab 06/13/18 1759 06/14/18 0349  WBC 5.5 8.4  RBC 3.71* 3.48*  HGB 10.6* 10.0*  HCT 34.6* 32.8*  MCV 93.3 94.3  MCH 28.6 28.7  MCHC 30.6 30.5  RDW 14.6 14.4  PLT 313 276   Cardiac Enzymes Recent Labs  Lab 06/13/18 1759 06/13/18 2217 06/14/18 0349  TROPONINI <0.03 <0.03 <0.03   No results for input(s): TROPIPOC in the last 168 hours.  BNPNo results for input(s): BNP, PROBNP in the last 168 hours.  DDimer No results for input(s): DDIMER in the last 168 hours.  Radiology/Studies:  Dg Chest Port 1 View  Result Date: 06/13/2018 CLINICAL DATA:  Substernal chest pain EXAM: PORTABLE CHEST 1 VIEW COMPARISON:  03/12/2018, CT 02/21/2017 FINDINGS: No acute opacity or pleural effusion. Stable cardiomediastinal silhouette with aortic atherosclerosis. No pneumothorax. Biapical scarring. IMPRESSION: No active disease. Electronically Signed   By: Donavan Foil M.D.   On: 06/13/2018 18:38     Assessment & Plan    1. Chest Pain with Atypical Features - presented with chest discomfort which started yesterday morning per her report and persisted throughout the day. No association with exertion or positional changes. SBP significantly elevated > 200 per EMS report and symptoms  resolved and BP improved with SL NTG.  -  Initial and cyclic troponin values have been negative. Initial EKG showing NSR, HR 78, with slight ST depression along the inferior leads which is similar to prior tracings. Repeat this AM shows NSR with nonspecific IVCD but LVH is more significantly pronounced with ST abnormalities along inferior and anterior leads which is consistent with repol abnormalities.  - overall, her presenting symptoms seem atypical but she does have cardiac risk factors (HTN, HLD, carotid artery stenosis, and former tobacco use). Given no objective evidence of ischemia, would plan for an echocardiogram for  initial assessment. Would likely favor medical management given her comorbidities. Already on Plavix 75mg  daily (on this prior to admission), statin, and ARB. No BB at this time given baseline COPD.   2. HTN - SBP initially in the 200's by EMS report. BP has been stable at 113/75 - 144/92 since admission. She has been continued on PTA Amlodipine 5mg  daily and Losartan 50mg  daily.   3. Systolic Ejection Murmur - noted on examination. Had prior echo in 2014 by review of Care Everywhere but the official report is not visible. Repeat echocardiogram is pending for further evaluation.   4. AAA - at 4.6 cm by imaging in 2019. Followed by CT Surgery at [facility].   5. COPD/Chronic Hypoxic Respiratory Failure - reports her respiratory status is at baseline. On 2L [place].   For questions or updates, please contact [place] Please consult [email or web address] for contact info under Cardiology/STEMI.  Signed, Erma Heritage, PA-C 06/14/2018, 9:00 AM Pager: [phone]   Attending note:  Patient seen and examined.  I reviewed available records and discussed the case with Ms. Ahmed Prima PA-C.  A history of COPD with chronic hypoxic respiratory failure, also atherosclerosis with AAA documented in 2019, 4.6 cm and asymptomatic.  She presents after a prolonged episode of chest discomfort associated with elevated blood pressure and resolved after several hours, nitroglycerin was also given.  She is significantly hard of hearing, history is somewhat difficult to obtain, but she does not report any further chest pain today.  On examination she is in no distress.  She is afebrile, heart rate is in the 70s in sinus rhythm by telemetry which I personally reviewed.  Systolic blood pressure in the 120s.  Lungs exhibit decreased breath sounds with prolonged expiratory phase.  Cardiac exam reveals RRR without gallop or rub.  She does have a 2/6 systolic murmur.  Lab work shows potassium 3.6, BUN 19, creatinine 0.72,  troponin I negative x3, hemoglobin 10.0, platelets 276.  Chest x-ray reports no acute process.  I personally reviewed her ECG which shows sinus rhythm with LVH and repolarization abnormalities.  Patient presents after prolonged episode of chest discomfort, negative troponin I levels argue against ACS, overall nonspecific ECG consistent with LVH and repolarization abnormalities.  She does have COPD with chronic hypoxic respiratory failure at baseline.  Chest x-ray shows no infiltrates, she was SARS coronavirus 2 negative.  Echocardiogram will be obtained to assess cardiac structure and function, also valvular status.  If this is reassuring, no further inpatient ischemic testing will be planned at this time.  Satira Sark, M.D., F.A.C.C.

## 2018-08-23 ENCOUNTER — Ambulatory Visit: Payer: Medicare Other | Admitting: Gastroenterology

## 2018-09-14 ENCOUNTER — Encounter (HOSPITAL_COMMUNITY): Admission: RE | Admit: 2018-09-14 | Source: Ambulatory Visit

## 2018-09-14 ENCOUNTER — Other Ambulatory Visit (HOSPITAL_COMMUNITY)
Admission: RE | Admit: 2018-09-14 | Discharge: 2018-09-14 | Disposition: A | Discharge status: Home / Self Care | Source: Ambulatory Visit | Attending: Internal Medicine | Admitting: Internal Medicine

## 2018-09-14 ENCOUNTER — Other Ambulatory Visit: Payer: Self-pay

## 2018-09-14 ENCOUNTER — Telehealth: Payer: Self-pay | Admitting: *Deleted

## 2018-09-14 NOTE — Telephone Encounter (Signed)
Procedure has been cancelled. FYI to AB

## 2018-09-14 NOTE — Telephone Encounter (Signed)
-----   Message from Wilmer Floor, RN sent at 09/14/2018  7:17 AM EDT ----- Regarding: Patient's daughter states she is on [place] and not doing well Jackie Walls,   I called [name]'s home to give her instructions and her daughter answered the phone and said she was under hospice care and not doing well.   She was for 09/18/2018 for Dr Gala Romney   Thanks,   Rosalyn Gess RN

## 2018-09-18 ENCOUNTER — Encounter (HOSPITAL_COMMUNITY): Admission: RE | Payer: Self-pay | Source: Home / Self Care

## 2018-09-18 ENCOUNTER — Ambulatory Visit (HOSPITAL_COMMUNITY): Admission: RE | Admit: 2018-09-18 | Source: Home / Self Care | Admitting: Internal Medicine

## 2018-09-18 SURGERY — COLONOSCOPY WITH PROPOFOL
Anesthesia: Monitor Anesthesia Care

## 2018-09-26 ENCOUNTER — Other Ambulatory Visit: Payer: Self-pay | Admitting: Gastroenterology

## 2018-10-22 ENCOUNTER — Emergency Department (HOSPITAL_COMMUNITY)

## 2018-10-22 ENCOUNTER — Encounter (HOSPITAL_COMMUNITY): Payer: Self-pay | Admitting: *Deleted

## 2018-10-22 ENCOUNTER — Other Ambulatory Visit: Payer: Self-pay

## 2018-10-22 ENCOUNTER — Emergency Department (HOSPITAL_COMMUNITY)
Admission: EM | Admit: 2018-10-22 | Discharge: 2018-10-23 | Disposition: A | Discharge status: Home / Self Care | Attending: Emergency Medicine | Admitting: Emergency Medicine

## 2018-10-22 DIAGNOSIS — N390 Urinary tract infection, site not specified: Secondary | ICD-10-CM | POA: Insufficient documentation

## 2018-10-22 DIAGNOSIS — I714 Abdominal aortic aneurysm, without rupture, unspecified: Secondary | ICD-10-CM

## 2018-10-22 DIAGNOSIS — Z20822 Contact with and (suspected) exposure to covid-19: Secondary | ICD-10-CM

## 2018-10-22 DIAGNOSIS — Z20828 Contact with and (suspected) exposure to other viral communicable diseases: Secondary | ICD-10-CM | POA: Diagnosis not present

## 2018-10-22 DIAGNOSIS — Z9981 Dependence on supplemental oxygen: Secondary | ICD-10-CM | POA: Insufficient documentation

## 2018-10-22 DIAGNOSIS — I10 Essential (primary) hypertension: Secondary | ICD-10-CM | POA: Diagnosis not present

## 2018-10-22 DIAGNOSIS — R109 Unspecified abdominal pain: Secondary | ICD-10-CM | POA: Diagnosis present

## 2018-10-22 DIAGNOSIS — R05 Cough: Secondary | ICD-10-CM | POA: Insufficient documentation

## 2018-10-22 DIAGNOSIS — F17210 Nicotine dependence, cigarettes, uncomplicated: Secondary | ICD-10-CM | POA: Insufficient documentation

## 2018-10-22 DIAGNOSIS — R059 Cough, unspecified: Secondary | ICD-10-CM

## 2018-10-22 DIAGNOSIS — Z79899 Other long term (current) drug therapy: Secondary | ICD-10-CM | POA: Diagnosis not present

## 2018-10-22 DIAGNOSIS — R079 Chest pain, unspecified: Secondary | ICD-10-CM | POA: Insufficient documentation

## 2018-10-22 DIAGNOSIS — R0602 Shortness of breath: Secondary | ICD-10-CM | POA: Insufficient documentation

## 2018-10-22 DIAGNOSIS — R1084 Generalized abdominal pain: Secondary | ICD-10-CM | POA: Diagnosis not present

## 2018-10-22 DIAGNOSIS — J449 Chronic obstructive pulmonary disease, unspecified: Secondary | ICD-10-CM | POA: Insufficient documentation

## 2018-10-22 LAB — CBC WITH DIFFERENTIAL/PLATELET
Abs Immature Granulocytes: 0.06 10*3/uL (ref 0.00–0.07)
Basophils Absolute: 0 10*3/uL (ref 0.0–0.1)
Basophils Relative: 0 %
Eosinophils Absolute: 0 10*3/uL (ref 0.0–0.5)
Eosinophils Relative: 0 %
HCT: 30.1 % — ABNORMAL LOW (ref 36.0–46.0)
Hemoglobin: 9.5 g/dL — ABNORMAL LOW (ref 12.0–15.0)
Immature Granulocytes: 1 %
Lymphocytes Relative: 4 %
Lymphs Abs: 0.5 10*3/uL — ABNORMAL LOW (ref 0.7–4.0)
MCH: 31.7 pg (ref 26.0–34.0)
MCHC: 31.6 g/dL (ref 30.0–36.0)
MCV: 100.3 fL — ABNORMAL HIGH (ref 80.0–100.0)
Monocytes Absolute: 0.4 10*3/uL (ref 0.1–1.0)
Monocytes Relative: 3 %
Neutro Abs: 11.3 10*3/uL — ABNORMAL HIGH (ref 1.7–7.7)
Neutrophils Relative %: 92 %
Platelets: 382 10*3/uL (ref 150–400)
RBC: 3 MIL/uL — ABNORMAL LOW (ref 3.87–5.11)
RDW: 16.4 % — ABNORMAL HIGH (ref 11.5–15.5)
WBC: 12.3 10*3/uL — ABNORMAL HIGH (ref 4.0–10.5)
nRBC: 0 % (ref 0.0–0.2)

## 2018-10-22 MED ORDER — IOHEXOL 300 MG/ML  SOLN
100.0000 mL | Freq: Once | INTRAMUSCULAR | Status: AC | PRN
  Administered 2018-10-23: 100 mL via INTRAVENOUS

## 2018-10-22 MED ORDER — SODIUM CHLORIDE 0.9 % IV SOLN
INTRAVENOUS | Status: DC
  Administered 2018-10-22: 22:00:00 via INTRAVENOUS

## 2018-10-22 NOTE — ED Notes (Signed)
Xray tech removed pt's necklace and place it in her shoe, pt advised as well,

## 2018-10-22 NOTE — ED Triage Notes (Signed)
Pt c/o left side chest and abd pain with sob that started today, has non productive cough, currently lives in household with daughter who tested positive for covid, pt is also currently under hospice care, does not have DNR

## 2018-10-22 NOTE — ED Provider Notes (Signed)
[facility] EMERGENCY DEPARTMENT Provider Note   CSN: [id number] Arrival date & time: 10/22/18  2000     History   Chief Complaint Chief Complaint  Patient presents with  . Abdominal Pain    HPI Jackie Walls is a 73 y.o. female.     Patient brought in by EMS.  Patient is on hospice care for severe COPD.  Patient is normally on 2 L of oxygen.  Patient is a full code however.  She is not a DNR.  Patient's other medical problems include hypertension hyperlipidemia abdominal aortic aneurysm thoracic aortic aneurysm.  Patient was brought in for cough shortness of breath left-sided chest pain and abdominal pain throughout the abdomen.  The cough occurred yesterday the left-sided chest pain and abdominal pain throughout occurred today.  The cough is nonproductive.  No reported fevers temp on arrival here is 99.7.  Oxygen levels was actually 100% respiratory rate 24.  Blood pressure elevated at 207/109.  The main concern is that the patient's daughter who stays in the household tested positive for COVID-19.  I think that was part of the main concern.  Patient does not appear to be any in any significant distress.  She does admit to the abdominal pain and left-sided chest pain.     Past Medical History:  Diagnosis Date  . Abdominal aortic aneurysm ([place])   . ALLERGIC RHINITIS   . Anxiety   . Bronchitis   . Chronic diarrhea   . COPD (chronic obstructive pulmonary disease) ([place])   . Duodenal ulcer   . Hyperlipidemia   . Hypertension   . IDA (iron deficiency anemia)   . Multiple gastric ulcers   . Thoracic aortic aneurysm [facility])     Patient Active Problem List   Diagnosis Date Noted  . Chest pain 06/13/2018  . IDA (iron deficiency anemia) 06/13/2018  . COPD with acute exacerbation ([place]) 03/12/2018  . Tobacco abuse   . Hyperkalemia 07/17/2017  . Blood loss anemia 04/01/2017  . Hyponatremia 04/01/2017  . Hypotension due to drugs   . PUD (peptic ulcer disease) 03/14/2017  .  Abnormal weight loss 03/14/2017  . Abdominal pain 03/14/2017  . Diarrhea 11/08/2014  . Microscopic colitis 11/08/2014  . Loose stools 07/29/2013  . Duodenal ulcer 07/29/2013  . Bleeding duodenal ulcer 05/15/2013  . Gastritis 05/15/2013  . Acute blood loss anemia 05/14/2013  . Symptomatic anemia 05/13/2013  . Heme positive stool 05/13/2013  . Chronic respiratory failure ([place]) 05/13/2013  . Upper GI bleed 05/13/2013  . Intra-abdominal free air of unknown etiology 04/25/2013  . Bowel perforation ([place]) 04/25/2013  . Protein-calorie malnutrition, severe ([place]) 04/25/2013  . Aneurysm of abdominal vessel ([place]) 11/14/2012  . 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 COPD (chronic obstructive pulmonary disease) ([place]) 01/29/2012  . HTN (hypertension) 01/29/2012  . Bronchitis   . Hyperlipidemia     Past Surgical History:  Procedure Laterality Date  . BIOPSY  04/02/2017   Procedure: BIOPSY;  Surgeon: Danie Binder, MD;  Location: [facility] ENDO SUITE;  Service: Endoscopy;;  gastric and gastric ulcer  . CAROTID ENDARTERECTOMY Right   . CESAREAN SECTION    . COLONOSCOPY N/A 08/13/2013   tubular adenomas, segmental biopsies with collagenous colitis  . ESOPHAGOGASTRODUODENOSCOPY N/A 05/15/2013   Dr. Oneida Alar: moderate erosive gastritis, duodenal ulcers, visible vessel with active oozing s/p bleeding control therapy, NSAID induced gastritis  . ESOPHAGOGASTRODUODENOSCOPY N/A 08/13/2013   RMR: EGD not needed and not done  . ESOPHAGOGASTRODUODENOSCOPY  02/23/2017   [facility]: arge 4 cm, deep, cratered ulcer along the lesser curvature of the stomach but no signs of active bleeding. H. pylori stool antigen is negative.   . ESOPHAGOGASTRODUODENOSCOPY N/A 04/02/2017   Dr. Oneida Alar: normal esophagus, large non-bleeding cratered gastric ulcer, normal duodenum, secondary to aspirin   . ESOPHAGOGASTRODUODENOSCOPY N/A 07/18/2017   PUD improved but not resolved in light of NSAID use, [place] lesion of stomach and oozing  gastric ulcer s/p clips and epi  . GASTRORRHAPHY N/A 04/25/2013   Procedure: GASTRORRHAPHY;  Surgeon: Jamesetta So, MD;  Location: [facility] ORS;  Service: General;  Laterality: N/A;  . LAPAROTOMY N/A 04/25/2013   Procedure: EXPLORATORY LAPAROTOMY;  Surgeon: Jamesetta So, MD;  Location: [facility] ORS;  Service: General;  Laterality: N/A;     OB History   No obstetric history on file.      Home Medications    Prior to Admission medications   Medication Sig Start Date End Date Taking? Authorizing Provider  albuterol (PROAIR HFA) 108 (90 Base) MCG/ACT inhaler Inhale 1-2 puffs into the lungs every 6 (six) hours as needed for wheezing or shortness of breath.   Yes [provider]  amLODipine (NORVASC) 5 MG tablet Take 1 tablet (5 mg total) by mouth daily. Patient taking differently: Take 10 mg by mouth every morning.  03/13/18  Yes Emokpae, Courage, MD  atorvastatin (LIPITOR) 40 MG tablet Take 1 tablet by mouth every evening.  06/06/17  Yes [provider]  budesonide (ENTOCORT EC) 3 MG 24 hr capsule Take 2 capsules (6 mg total) by mouth daily. Patient taking differently: Take 3 mg by mouth 2 (two) times a day.  05/16/18  Yes Annitta Needs, NP  budesonide-formoterol (SYMBICORT) 160-4.5 MCG/ACT inhaler Inhale 2 puffs into the lungs 2 (two) times a day.    Yes [provider]  clopidogrel (PLAVIX) 75 MG tablet Take 1 tablet (75 mg total) by mouth daily. Patient taking differently: Take 75 mg by mouth every morning.  04/04/17  Yes Fields, Marga Melnick, MD  dicyclomine (BENTYL) 10 MG capsule TAKE 1 CAPSULE 4 TIMES DAILY BEFORE MEALS AND AT BEDTIME AS NEEDED FOR DIARRHEA Patient taking differently: Take 10 mg by mouth 4 (four) times daily -  before meals and at bedtime.  04/27/18  Yes Carlis Stable, NP  diphenhydrAMINE (BENADRYL) 25 mg capsule Take 50 mg by mouth at bedtime.   Yes [provider]  doxazosin (CARDURA) 4 MG tablet Take 1 tablet (4 mg total) by mouth every morning. 06/14/18   Yes Mikhail, Maryann, DO  gabapentin (NEURONTIN) 100 MG capsule Take 100 mg by mouth at bedtime.   Yes [provider]  isosorbide mononitrate (IMDUR) 60 MG 24 hr tablet Take 1 tablet by mouth daily. 10/11/18  Yes [provider]  mirtazapine (REMERON) 15 MG tablet Take 1 tablet by mouth at bedtime. 06/06/17  Yes [provider]  pantoprazole (PROTONIX) 40 MG tablet Take 1 tablet (40 mg total) by mouth 2 (two) times daily. 06/14/18  Yes Mikhail, Velta Addison, DO  umeclidinium bromide (INCRUSE ELLIPTA) 62.5 MCG/INH AEPB Take 1 Inhaler by mouth every evening.  07/21/17  Yes [provider]  Vitamin D, Ergocalciferol, (DRISDOL) 1.25 MG (50000 UT) CAPS capsule Take 50,000 Units by mouth every Monday.    Yes [provider]  acetaminophen (TYLENOL) 325 MG tablet Take 2 tablets (650 mg total) by mouth every 6 (six) hours as needed for mild pain (or Fever >/= 101). 07/19/17  Barton Dubois, MD  albuterol (PROVENTIL) (2.5 MG/3ML) 0.083% nebulizer solution Inhale 3 mLs into the lungs daily as needed for wheezing or shortness of breath.  02/28/17   [provider]  Aromatic Inhalants (VICKS VAPOR INHALER IN) Inhale 1 spray into the lungs daily as needed (FOR CONGESTION).    [provider]  HYDROcodone-acetaminophen (NORCO/VICODIN) 5-325 MG tablet Take 1 tablet by mouth 2 (two) times daily.     [provider]  losartan (COZAAR) 50 MG tablet Take 1 tablet (50 mg total) by mouth daily. 03/13/18   Roxan Hockey, MD  polyethylene glycol-electrolytes (TRILYTE) 420 g solution Take 4,000 mLs by mouth as directed. 05/17/18   Rourk, Cristopher Estimable, MD  potassium chloride SA (K-DUR,KLOR-CON) 20 MEQ tablet Take 1 tablet by mouth every evening.     [provider]    Family History Family History  Problem Relation Age of Onset  . Cancer Sister   . Colon cancer Neg Hx     Social History Social History   Tobacco Use  . Smoking status: Current Every  Day Smoker    Packs/day: 0.25    Types: Cigarettes    Start date: 01/05/1948  . Smokeless tobacco: Never Used  Substance Use Topics  . Alcohol use: No  . Drug use: No     Allergies   Codeine and Ibuprofen   Review of Systems Review of Systems  Constitutional: Negative for chills and fever.  HENT: Negative for congestion, rhinorrhea and sore throat.   Eyes: Negative for visual disturbance.  Respiratory: Positive for cough. Negative for shortness of breath.   Cardiovascular: Positive for chest pain. Negative for leg swelling.  Gastrointestinal: Positive for abdominal pain. Negative for diarrhea, nausea and vomiting.  Genitourinary: Negative for dysuria.  Musculoskeletal: Negative for back pain and neck pain.  Skin: Negative for rash.  Neurological: Negative for dizziness, light-headedness and headaches.  Hematological: Does not bruise/bleed easily.  Psychiatric/Behavioral: Negative for confusion.     Physical Exam Updated Vital Signs BP (!) 192/101   Pulse 80   Temp 99.7 F (37.6 C) (Oral)   Resp (!) 22   Ht 1.651 m (5\' 5" )   Wt 45.4 kg   SpO2 100%   BMI 16.64 kg/m   Physical Exam Vitals signs and nursing note reviewed.  Constitutional:      General: She is not in acute distress.    Appearance: She is ill-appearing.  HENT:     Head: Normocephalic and atraumatic.  Eyes:     Conjunctiva/sclera: Conjunctivae normal.  Neck:     Musculoskeletal: Normal range of motion and neck supple.  Cardiovascular:     Rate and Rhythm: Normal rate and regular rhythm.     Heart sounds: No murmur.  Pulmonary:     Effort: Pulmonary effort is normal. No respiratory distress.     Breath sounds: Normal breath sounds.  Abdominal:     Palpations: Abdomen is soft.     Tenderness: There is no abdominal tenderness.  Musculoskeletal: Normal range of motion.        General: No swelling.  Skin:    General: Skin is warm and dry.     Capillary Refill: Capillary refill takes less than 2  seconds.  Neurological:     Mental Status: She is alert. Mental status is at baseline.      ED Treatments / Results  Labs (all labs ordered are listed, but only abnormal results are displayed) Labs Reviewed  SARS CORONAVIRUS 2 (  TAT 6-24 HRS)  CBC WITH DIFFERENTIAL/PLATELET  COMPREHENSIVE METABOLIC PANEL  LIPASE, BLOOD  URINALYSIS, ROUTINE W REFLEX MICROSCOPIC  TROPONIN I (HIGH SENSITIVITY)  TROPONIN I (HIGH SENSITIVITY)    EKG EKG Interpretation  Date/Time:  Sunday October 22 2018 20:07:54 EDT Ventricular Rate:  83 PR Interval:    QRS Duration: 84 QT Interval:  357 QTC Calculation: 420 R Axis:   -3 Text Interpretation:  Sinus rhythm Atrial premature complex Left ventricular hypertrophy Confirmed by Fredia Sorrow [phone]) on 10/22/2018 8:31:11 PM   Radiology Dg Chest Portable 1 View  Result Date: 10/22/2018 CLINICAL DATA:  Cough EXAM: PORTABLE CHEST 1 VIEW COMPARISON:  June 13, 2018 FINDINGS: The heart size and mediastinal contours is unchanged. Both lungs are clear. The visualized skeletal structures are unremarkable. Rounded radiopaque density seen overlying the left shoulder. IMPRESSION: No acute cardiopulmonary process. Electronically Signed   By: Prudencio Pair M.D.   On: 10/22/2018 21:08    Procedures Procedures (including critical care time)  Medications Ordered in ED Medications  0.9 %  sodium chloride infusion ( Intravenous New Bag/Given 10/22/18 2144)  iohexol (OMNIPAQUE) 300 MG/ML solution 100 mL (has no administration in time range)     Initial Impression / Assessment and Plan / ED Course  I have reviewed the triage vital signs and the nursing notes.  Pertinent labs & imaging results that were available during my care of the patient were reviewed by me and considered in my medical decision making (see chart for details).       Patient very pale somewhat frail and ill-appearing.  Abdomen with some diffuse tenderness but no point tenderness.  Chest  x-ray negative for pneumonia.  Patient oxygen saturations are good on 2 L of oxygen.  Patient's troponins in the past have been normal.  EKG here tonight without any acute changes.  Patient will have labs and CT scan of the abdomen to include a troponin.  They are still pending.  COVID test has been ordered.  If CT scan and labs showed no indication for admission patient can be discharged home.  Patient is already been exposed to her daughter who is COVID positive.  Precautions will need to be taken at home.     Final Clinical Impressions(s) / ED Diagnoses   Final diagnoses:  Generalized abdominal pain  Chest pain, unspecified type  Exposure to COVID-19 virus  Cough    ED Discharge Orders    None       Fredia Sorrow, MD 10/22/18 2325

## 2018-10-23 LAB — SARS CORONAVIRUS 2 BY RT PCR (HOSPITAL ORDER, PERFORMED IN ~~LOC~~ HOSPITAL LAB): SARS Coronavirus 2: NEGATIVE

## 2018-10-23 LAB — COMPREHENSIVE METABOLIC PANEL
ALT: 9 U/L (ref 0–44)
AST: 14 U/L — ABNORMAL LOW (ref 15–41)
Albumin: 2.9 g/dL — ABNORMAL LOW (ref 3.5–5.0)
Alkaline Phosphatase: 70 U/L (ref 38–126)
Anion gap: 8 (ref 5–15)
BUN: 19 mg/dL (ref 8–23)
CO2: 24 mmol/L (ref 22–32)
Calcium: 8.1 mg/dL — ABNORMAL LOW (ref 8.9–10.3)
Chloride: 104 mmol/L (ref 98–111)
Creatinine, Ser: 0.8 mg/dL (ref 0.44–1.00)
GFR calc Af Amer: >60 mL/min (ref >60)
GFR calc non Af Amer: >60 mL/min (ref >60)
Glucose, Bld: 94 mg/dL (ref 70–99)
Potassium: 3.4 mmol/L — ABNORMAL LOW (ref 3.5–5.1)
Sodium: 136 mmol/L (ref 135–145)
Total Bilirubin: 0.2 mg/dL — ABNORMAL LOW (ref 0.3–1.2)
Total Protein: 5.5 g/dL — ABNORMAL LOW (ref 6.5–8.1)

## 2018-10-23 LAB — URINALYSIS, ROUTINE W REFLEX MICROSCOPIC
Bilirubin Urine: NEGATIVE
Glucose, UA: NEGATIVE mg/dL
Ketones, ur: NEGATIVE mg/dL
Nitrite: POSITIVE — AB
Protein, ur: NEGATIVE mg/dL
Specific Gravity, Urine: 1.043 — ABNORMAL HIGH (ref 1.005–1.030)
pH: 5 (ref 5.0–8.0)

## 2018-10-23 LAB — TROPONIN I (HIGH SENSITIVITY)
Troponin I (High Sensitivity): 19 ng/L — ABNORMAL HIGH (ref <18)
Troponin I (High Sensitivity): 22 ng/L — ABNORMAL HIGH (ref <18)

## 2018-10-23 LAB — LIPASE, BLOOD: Lipase: 16 U/L (ref 11–51)

## 2018-10-23 LAB — SARS CORONAVIRUS 2 (TAT 6-24 HRS): SARS Coronavirus 2: NEGATIVE

## 2018-10-23 MED ORDER — SODIUM CHLORIDE 0.9 % IV SOLN
1.0000 g | Freq: Once | INTRAVENOUS | Status: AC
  Administered 2018-10-23: 08:00:00 1 g via INTRAVENOUS
  Filled 2018-10-23: qty 10

## 2018-10-23 MED ORDER — CEPHALEXIN 500 MG PO CAPS: 500.0000 mg | ORAL_CAPSULE | Freq: Two times a day (BID) | ORAL | 0 refills | Status: AC

## 2018-10-23 NOTE — ED Notes (Signed)
Have spoken again with Sam with [facility] who referred me to their social worker. Spoke with social worker who advised she worked on placement for Ms. Gonzalo all week last week and her daughter kept finding reasons for not placing her in various facilities. She will call and speak to daughter and return my call regarding a plan.

## 2018-10-23 NOTE — ED Provider Notes (Signed)
Patient signed out to me by Dr. Bobby Rumpf.  Patient seen with chest pain and abdominal pain.  There was apparently concern for Covid as well because patient's daughter is positive.  Patient with increasing shortness of breath but no fever.  Chest pain not specifically exertional, likely secondary to her severe COPD, however troponins are very slightly elevated but delta is insignificant.  Patient signed out to me with labs still pending.  There was a significant delay in obtaining labs and then running them.  She has a mild leukocytosis, otherwise blood work unremarkable.  Once again a significant delay in obtaining urinalysis, but ultimately there is suggestion of infection.  CT scan performed.  Patient has progression of known infrarenal aortic aneurysm with thrombus.  No evidence of rupture.  Patient does have bilateral femoral pulses, lower extremities are warm with good perfusion.  Repeat examination reveals diffuse tenderness with some guarding of the abdomen.  Etiology unclear.  She does have a urinary tract infection.  Results for orders placed or performed during the hospital encounter of 10/22/18  CBC with Differential/Platelet  Result Value Ref Range   WBC 12.3 (H) 4.0 - 10.5 K/uL   RBC 3.00 (L) 3.87 - 5.11 MIL/uL   Hemoglobin 9.5 (L) 12.0 - 15.0 g/dL   HCT 30.1 (L) 36.0 - 46.0 %   MCV 100.3 (H) 80.0 - 100.0 fL   MCH 31.7 26.0 - 34.0 pg   MCHC 31.6 30.0 - 36.0 g/dL   RDW 16.4 (H) 11.5 - 15.5 %   Platelets 382 150 - 400 K/uL   nRBC 0.0 0.0 - 0.2 %   Neutrophils Relative % 92 %   Neutro Abs 11.3 (H) 1.7 - 7.7 K/uL   Lymphocytes Relative 4 %   Lymphs Abs 0.5 (L) 0.7 - 4.0 K/uL   Monocytes Relative 3 %   Monocytes Absolute 0.4 0.1 - 1.0 K/uL   Eosinophils Relative 0 %   Eosinophils Absolute 0.0 0.0 - 0.5 K/uL   Basophils Relative 0 %   Basophils Absolute 0.0 0.0 - 0.1 K/uL   Immature Granulocytes 1 %   Abs Immature Granulocytes 0.06 0.00 - 0.07 K/uL  Comprehensive metabolic  panel  Result Value Ref Range   Sodium 136 135 - 145 mmol/L   Potassium 3.4 (L) 3.5 - 5.1 mmol/L   Chloride 104 98 - 111 mmol/L   CO2 24 22 - 32 mmol/L   Glucose, Bld 94 70 - 99 mg/dL   BUN 19 8 - 23 mg/dL   Creatinine, Ser 0.80 0.44 - 1.00 mg/dL   Calcium 8.1 (L) 8.9 - 10.3 mg/dL   Total Protein 5.5 (L) 6.5 - 8.1 g/dL   Albumin 2.9 (L) 3.5 - 5.0 g/dL   AST 14 (L) 15 - 41 U/L   ALT 9 0 - 44 U/L   Alkaline Phosphatase 70 38 - 126 U/L   Total Bilirubin 0.2 (L) 0.3 - 1.2 mg/dL   GFR calc non Af Amer >60 >60 mL/min   GFR calc Af Amer >60 >60 mL/min   Anion gap 8 5 - 15  Lipase, blood  Result Value Ref Range   Lipase 16 11 - 51 U/L  Urinalysis, Routine w reflex microscopic  Result Value Ref Range   Color, Urine YELLOW YELLOW   APPearance CLEAR CLEAR   Specific Gravity, Urine 1.043 (H) 1.005 - 1.030   pH 5.0 5.0 - 8.0   Glucose, UA NEGATIVE NEGATIVE mg/dL   Hgb urine dipstick SMALL (A)  NEGATIVE   Bilirubin Urine NEGATIVE NEGATIVE   Ketones, ur NEGATIVE NEGATIVE mg/dL   Protein, ur NEGATIVE NEGATIVE mg/dL   Nitrite POSITIVE (A) NEGATIVE   Leukocytes,Ua MODERATE (A) NEGATIVE   RBC / HPF 0-5 0 - 5 RBC/hpf   WBC, UA 11-20 0 - 5 WBC/hpf   Bacteria, UA RARE (A) NONE SEEN  Troponin I (High Sensitivity)  Result Value Ref Range   Troponin I (High Sensitivity) 19 (H) <18 ng/L  Troponin I (High Sensitivity)  Result Value Ref Range   Troponin I (High Sensitivity) 22 (H) <18 ng/L   Ct Abdomen Pelvis W Contrast  Result Date: 10/23/2018 CLINICAL DATA:  Acute abdominal pain EXAM: CT ABDOMEN AND PELVIS WITH CONTRAST TECHNIQUE: Multidetector CT imaging of the abdomen and pelvis was performed using the standard protocol following bolus administration of intravenous contrast. CONTRAST:  192mL OMNIPAQUE IOHEXOL 300 MG/ML  SOLN COMPARISON:  April 01, 2017 FINDINGS: Lower chest: There is mild cardiomegaly. Coronary artery calcifications are seen. No hiatal hernia. The visualized portions of the  lungs are clear. Hepatobiliary: The liver is normal in density without focal abnormality.The main portal vein is patent. No evidence of calcified gallstones, gallbladder wall thickening or biliary dilatation. Pancreas: Unremarkable. No pancreatic ductal dilatation or surrounding inflammatory changes. Spleen: Normal in size without focal abnormality. Adrenals/Urinary Tract: Both adrenal glands appear normal. There is left-sided renal atrophy with multiple low-density lesions seen within both kidneys. The largest within the lower pole of the left kidney measuring 3 cm as on prior exam. Stomach/Bowel: The stomach and small bowel are normal in appearance. There is a broad are 2 large amount of right colonic and rectal stool present. Vascular/Lymphatic: There are no enlarged mesenteric, retroperitoneal, or pelvic lymph nodes. There is an infrarenal abdominal aortic aneurysm measuring up to 4.9 cm in diameter, increased in size from the prior exam. There is extensive mural thrombus which appears to have progressed since prior exam. 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 The IMA is again not visualized. There is dense vascular calcification at the origin of the celiac and SMA, however they do appear to be patent. Reproductive: The adnexa are unremarkable. Other: No evidence of abdominal wall mass or hernia. Musculoskeletal: No acute or significant osseous findings. There is diffuse osteopenia. A chronic compression deformity of the T11 vertebral body with 75% loss in vertebral body height. Healed bilateral inferior pubic rami fractures are seen. IMPRESSION: 1. Infrarenal abdominal aortic aneurysm measuring 4.9 cm in maximum diameter, increased in size from the prior exam with progressive intramural thrombus. No definite evidence of rupture. 2. 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 3. Moderate to large amount of colonic stool without evidence of obstruction. 4. Chronic compression deformity of the T11 vertebral body with 75% loss in vertebral body height. Electronically Signed   By: Prudencio Pair M.D.   On: 10/23/2018 01:42   Dg Chest Portable 1 View  Result Date: 10/22/2018 CLINICAL DATA:  Cough EXAM: PORTABLE CHEST 1 VIEW COMPARISON:  June 13, 2018 FINDINGS: The heart size and mediastinal contours is unchanged. Both lungs are clear. The visualized skeletal structures are unremarkable. Rounded radiopaque density seen overlying the left shoulder. IMPRESSION: No acute cardiopulmonary process. Electronically Signed   By: Prudencio Pair M.D.   On: 10/22/2018 21:08      Orpah Greek,  MD 10/23/18 [phone]

## 2018-10-23 NOTE — ED Notes (Signed)
ED TO INPATIENT HANDOFF REPORT  ED Nurse Name and Phone #: Beverley Sherrard [phone]  S Name/Age/Gender Jackie Walls 73 y.o. female Room/Bed: APA07/APA07  Code Status   Code Status: Prior  Home/SNF/Other Home Patient oriented to: self, place, time and situation Is this baseline? Yes   Triage Complete: Triage complete  Chief Complaint Abdominal Pain and Chest Pain  Triage Note Pt c/o left side chest and abd pain with sob that started today, has non productive cough, currently lives in household with daughter who tested positive for covid, pt is also currently under hospice care, does not have DNR   Allergies Allergies  Allergen Reactions  . Codeine     Unable to recall reaction  . Ibuprofen Other (See Comments)    History of gastric ulcers.    Level of Care/Admitting Diagnosis ED Disposition    ED Disposition Condition Comment   Discharge  The patient appears reasonably screened and/or stabilized for discharge and I doubt any other medical condition or other [facility] requiring further screening, evaluation, or treatment in the ED exists or is present at this time prior to discharge.       B Medical/Surgery History Past Medical History:  Diagnosis Date  . Abdominal aortic aneurysm ([place])   . ALLERGIC RHINITIS   . Anxiety   . Bronchitis   . Chronic diarrhea   . COPD (chronic obstructive pulmonary disease) ([place])   . Duodenal ulcer   . Hyperlipidemia   . Hypertension   . IDA (iron deficiency anemia)   . Multiple gastric ulcers   . Thoracic aortic aneurysm [facility])    Past Surgical History:  Procedure Laterality Date  . BIOPSY  04/02/2017   Procedure: BIOPSY;  Surgeon: Danie Binder, MD;  Location: [facility] ENDO SUITE;  Service: Endoscopy;;  gastric and gastric ulcer  . CAROTID ENDARTERECTOMY Right   . CESAREAN SECTION    . COLONOSCOPY N/A 08/13/2013   tubular adenomas, segmental biopsies with collagenous colitis  . ESOPHAGOGASTRODUODENOSCOPY N/A 05/15/2013   Dr. Oneida Alar:  moderate erosive gastritis, duodenal ulcers, visible vessel with active oozing s/p bleeding control therapy, NSAID induced gastritis  . ESOPHAGOGASTRODUODENOSCOPY N/A 08/13/2013   RMR: EGD not needed and not done  . ESOPHAGOGASTRODUODENOSCOPY  02/23/2017   [facility]: arge 4 cm, deep, cratered ulcer along the lesser curvature of the stomach but no signs of active bleeding. H. pylori stool antigen is negative.   . ESOPHAGOGASTRODUODENOSCOPY N/A 04/02/2017   Dr. Oneida Alar: normal esophagus, large non-bleeding cratered gastric ulcer, normal duodenum, secondary to aspirin   . ESOPHAGOGASTRODUODENOSCOPY N/A 07/18/2017   PUD improved but not resolved in light of NSAID use, Dieulafoy lesion of stomach and oozing gastric ulcer s/p clips and epi  . GASTRORRHAPHY N/A 04/25/2013   Procedure: GASTRORRHAPHY;  Surgeon: Jamesetta So, MD;  Location: [facility] ORS;  Service: General;  Laterality: N/A;  . LAPAROTOMY N/A 04/25/2013   Procedure: EXPLORATORY LAPAROTOMY;  Surgeon: Jamesetta So, MD;  Location: [facility] ORS;  Service: General;  Laterality: N/A;     A IV Location/Drains/Wounds Patient Lines/Drains/Airways Status   Active Line/Drains/Airways    Name:   Placement date:   Placement time:   Site:   Days:   Peripheral IV 10/22/18 Left Hand   10/22/18    2018    Hand   1          Intake/Output Last 24 hours  Intake/Output Summary (Last 24 hours) at 10/23/2018 1523 Last data filed at 10/23/2018 0931 Gross per 24 hour  Intake 100 ml  Output -  Net 100 ml    Labs/Imaging Results for orders placed or performed during the hospital encounter of 10/22/18 (from the past 48 hour(s))  CBC with Differential/Platelet     Status: Abnormal   Collection Time: 10/22/18 10:32 PM  Result Value Ref Range   WBC 12.3 (H) 4.0 - 10.5 K/uL   RBC 3.00 (L) 3.87 - 5.11 MIL/uL   Hemoglobin 9.5 (L) 12.0 - 15.0 g/dL   HCT 30.1 (L) 36.0 - 46.0 %   MCV 100.3 (H) 80.0 - 100.0 fL   MCH 31.7 26.0 - 34.0 pg   MCHC 31.6 30.0 - 36.0 g/dL   RDW  16.4 (H) 11.5 - 15.5 %   Platelets 382 150 - 400 K/uL   nRBC 0.0 0.0 - 0.2 %   Neutrophils Relative % 92 %   Neutro Abs 11.3 (H) 1.7 - 7.7 K/uL   Lymphocytes Relative 4 %   Lymphs Abs 0.5 (L) 0.7 - 4.0 K/uL   Monocytes Relative 3 %   Monocytes Absolute 0.4 0.1 - 1.0 K/uL   Eosinophils Relative 0 %   Eosinophils Absolute 0.0 0.0 - 0.5 K/uL   Basophils Relative 0 %   Basophils Absolute 0.0 0.0 - 0.1 K/uL   Immature Granulocytes 1 %   Abs Immature Granulocytes 0.06 0.00 - 0.07 K/uL    Comment: Performed at [facility], [address] [place], [place] [address]  Comprehensive metabolic panel     Status: Abnormal   Collection Time: 10/22/18 10:32 PM  Result Value Ref Range   Sodium 136 135 - 145 mmol/L   Potassium 3.4 (L) 3.5 - 5.1 mmol/L   Chloride 104 98 - 111 mmol/L   CO2 24 22 - 32 mmol/L   Glucose, Bld 94 70 - 99 mg/dL   BUN 19 8 - 23 mg/dL   Creatinine, Ser 0.80 0.44 - 1.00 mg/dL   Calcium 8.1 (L) 8.9 - 10.3 mg/dL   Total Protein 5.5 (L) 6.5 - 8.1 g/dL   Albumin 2.9 (L) 3.5 - 5.0 g/dL   AST 14 (L) 15 - 41 U/L   ALT 9 0 - 44 U/L   Alkaline Phosphatase 70 38 - 126 U/L   Total Bilirubin 0.2 (L) 0.3 - 1.2 mg/dL   GFR calc non Af Amer >60 >60 mL/min   GFR calc Af Amer >60 >60 mL/min   Anion gap 8 5 - 15    Comment: Performed at [facility], [address] [place], [place] [address]  Lipase, blood     Status: None   Collection Time: 10/22/18 10:32 PM  Result Value Ref Range   Lipase 16 11 - 51 U/L    Comment: Performed at [facility], [address], [place], [place] [address]  Troponin I (High Sensitivity)     Status: Abnormal   Collection Time: 10/22/18 10:32 PM  Result Value Ref Range   Troponin I (High Sensitivity) 19 (H) <18 ng/L    Comment: (NOTE) Elevated high sensitivity troponin I (hsTnI) values and significant  changes across serial measurements may suggest ACS but many other  chronic and acute conditions are known to elevate hsTnI results.  Refer to the  "Links" section for chest pain algorithms and additional  guidance. Performed at [facility], [address], [place], [place] [address]   Troponin I (High Sensitivity)     Status: Abnormal   Collection Time: 10/23/18  1:19 AM  Result Value Ref Range  Troponin I (High Sensitivity) 22 (H) <18 ng/L    Comment: (NOTE) Elevated high sensitivity troponin I (hsTnI) values and significant  changes across serial measurements may suggest ACS but many other  chronic and acute conditions are known to elevate hsTnI results.  Refer to the "Links" section for chest pain algorithms and additional  guidance. Performed at [facility], [address], [place], [place] [address]   SARS Coronavirus 2 by RT PCR (hospital order, performed in [facility] hospital lab) Nasopharyngeal Nasopharyngeal Swab     Status: None   Collection Time: 10/23/18  4:07 AM   Specimen: Nasopharyngeal Swab  Result Value Ref Range   SARS Coronavirus 2 NEGATIVE NEGATIVE    Comment: (NOTE) If result is NEGATIVE SARS-CoV-2 target nucleic acids are NOT DETECTED. The SARS-CoV-2 RNA is generally detectable in upper and lower  respiratory specimens during the acute phase of infection. The lowest  concentration of SARS-CoV-2 viral copies this assay can detect is 250  copies / mL. A negative result does not preclude SARS-CoV-2 infection  and should not be used as the sole basis for treatment or other  patient management decisions.  A negative result may occur with  improper specimen collection / handling, submission of specimen other  than nasopharyngeal swab, presence of viral mutation(s) within the  areas targeted by this assay, and inadequate number of viral copies  (<250 copies / mL). A negative result must be combined with clinical  observations, patient history, and epidemiological information. If result is POSITIVE SARS-CoV-2 target nucleic acids are DETECTED. The SARS-CoV-2 RNA is generally detectable in upper and lower   respiratory specimens dur ing the acute phase of infection.  Positive  results are indicative of active infection with SARS-CoV-2.  Clinical  correlation with patient history and other diagnostic information is  necessary to determine patient infection status.  Positive results do  not rule out bacterial infection or co-infection with other viruses. If result is PRESUMPTIVE POSTIVE SARS-CoV-2 nucleic acids MAY BE PRESENT.   A presumptive positive result was obtained on the submitted specimen  and confirmed on repeat testing.  While 2019 novel coronavirus  (SARS-CoV-2) nucleic acids may be present in the submitted sample  additional confirmatory testing may be necessary for epidemiological  and / or clinical management purposes  to differentiate between  SARS-CoV-2 and other Sarbecovirus currently known to infect humans.  If clinically indicated additional testing with an alternate test  methodology [phone]) is advised. The SARS-CoV-2 RNA is generally  detectable in upper and lower respiratory sp ecimens during the acute  phase of infection. The expected result is Negative. Fact Sheet for Patients:  StrictlyIdeas.no Fact Sheet for Healthcare Providers: BankingDealers.co.za This test is not yet approved or cleared by the [place] FDA and has been authorized for detection and/or diagnosis of SARS-CoV-2 by FDA under an Emergency Use Authorization (EUA).  This EUA will remain in effect (meaning this test can be used) for the duration of the COVID-19 declaration under Section 564(b)(1) of the Act, 21 U.S.C. section 360bbb-3(b)(1), unless the authorization is terminated or revoked sooner. Performed at [facility], [address] [place], [place] [address]   Urinalysis, Routine w reflex microscopic     Status: Abnormal   Collection Time: 10/23/18  5:25 AM  Result Value Ref Range   Color, Urine YELLOW YELLOW   APPearance CLEAR CLEAR    Specific Gravity, Urine 1.043 (H) 1.005 - 1.030   pH 5.0 5.0 - 8.0   Glucose, UA  NEGATIVE NEGATIVE mg/dL   Hgb urine dipstick SMALL (A) NEGATIVE   Bilirubin Urine NEGATIVE NEGATIVE   Ketones, ur NEGATIVE NEGATIVE mg/dL   Protein, ur NEGATIVE NEGATIVE mg/dL   Nitrite POSITIVE (A) NEGATIVE   Leukocytes,Ua MODERATE (A) NEGATIVE   RBC / HPF 0-5 0 - 5 RBC/hpf   WBC, UA 11-20 0 - 5 WBC/hpf   Bacteria, UA RARE (A) NONE SEEN    Comment: Performed at [facility], [address] [place], [place] [address]   Ct Abdomen Pelvis W Contrast  Result Date: 10/23/2018 CLINICAL DATA:  Acute abdominal pain EXAM: CT ABDOMEN AND PELVIS WITH CONTRAST TECHNIQUE: Multidetector CT imaging of the abdomen and pelvis was performed using the standard protocol following bolus administration of intravenous contrast. CONTRAST:  1104mL OMNIPAQUE IOHEXOL 300 MG/ML  SOLN COMPARISON:  April 01, 2017 FINDINGS: Lower chest: There is mild cardiomegaly. Coronary artery calcifications are seen. No hiatal hernia. The visualized portions of the lungs are clear. Hepatobiliary: The liver is normal in density without focal abnormality.The main portal vein is patent. No evidence of calcified gallstones, gallbladder wall thickening or biliary dilatation. Pancreas: Unremarkable. No pancreatic ductal dilatation or surrounding inflammatory changes. Spleen: Normal in size without focal abnormality. Adrenals/Urinary Tract: Both adrenal glands appear normal. There is left-sided renal atrophy with multiple low-density lesions seen within both kidneys. The largest within the lower pole of the left kidney measuring 3 cm as on prior exam. Stomach/Bowel: The stomach and small bowel are normal in appearance. There is a broad are 2 large amount of right colonic and rectal stool present. Vascular/Lymphatic: There are no enlarged mesenteric, retroperitoneal, or pelvic lymph nodes. There is an infrarenal abdominal aortic aneurysm measuring up to 4.9 cm in  diameter, increased in size from the prior exam. There is extensive mural thrombus which appears to have progressed since prior exam. 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 The IMA is again not visualized. There is dense vascular calcification at the origin of the celiac and SMA, however they do appear to be patent. Reproductive: The adnexa are unremarkable. Other: No evidence of abdominal wall mass or hernia. Musculoskeletal: No acute or significant osseous findings. There is diffuse osteopenia. A chronic compression deformity of the T11 vertebral body with 75% loss in vertebral body height. Healed bilateral inferior pubic rami fractures are seen. IMPRESSION: 1. Infrarenal abdominal aortic aneurysm measuring 4.9 cm in maximum diameter, increased in size from the prior exam with progressive intramural thrombus. No definite evidence of rupture. 2. 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 3. Moderate to large amount of colonic stool without evidence of obstruction. 4. Chronic compression deformity of the T11 vertebral body with 75% loss in vertebral body height. Electronically Signed   By: Prudencio Pair M.D.   On: 10/23/2018 01:42   Dg Chest Portable 1 View  Result Date: 10/22/2018 CLINICAL DATA:  Cough EXAM: PORTABLE CHEST 1 VIEW COMPARISON:  June 13, 2018 FINDINGS: The heart size and mediastinal contours is unchanged. Both lungs are clear. The visualized skeletal structures are unremarkable. Rounded radiopaque density seen overlying the left shoulder. IMPRESSION: No acute cardiopulmonary process. Electronically Signed   By: Prudencio Pair M.D.   On: 10/22/2018 21:08    Pending Labs Unresulted Labs (From admission, onward)    Start     Ordered   10/23/18 [phone]  Urine Culture  Once,   STAT     10/23/18 0609   10/22/18 2316  SARS CORONAVIRUS 2  (TAT 6-24 HRS) Nasopharyngeal Nasopharyngeal Swab  (Asymptomatic/Tier 2 Patients Labs)  Once,   STAT    Question Answer Comment  Is this test for diagnosis or screening Diagnosis of ill patient   Symptomatic for COVID-19 as defined by CDC Yes   Date of Symptom Onset 10/14/2018   Hospitalized for COVID-19 Yes   Admitted to ICU for COVID-19 No   Previously tested for COVID-19 No   Resident in a congregate (group) care setting No   Employed in healthcare setting No   Pregnant No      10/22/18 2315          Vitals/Pain Today's Vitals   10/23/18 1330 10/23/18 1400 10/23/18 1430 10/23/18 1504  BP: (!) 157/112 (!) 168/91 (!) 156/84 (!) 164/96  Pulse: 84 85 83 89  Resp: 18 18 17 18   Temp:      TempSrc:      SpO2: 98% 98% 97% 97%  Weight:      Height:      PainSc:        Isolation Precautions No active isolations  Medications Medications  0.9 %  sodium chloride infusion (75 mL/hr Intravenous Rate/Dose Verify 10/23/18 0752)  iohexol (OMNIPAQUE) 300 MG/ML solution 100 mL (100 mLs Intravenous Contrast Given 10/23/18 0034)  cefTRIAXone (ROCEPHIN) 1 g in sodium chloride 0.9 % 100 mL IVPB (0 g Intravenous Stopped 10/23/18 0931)    Mobility walks with device Low fall risk   Focused Assessments    R Recommendations: See Admitting Provider Note  Report given to:   Additional Notes:

## 2018-10-23 NOTE — Progress Notes (Signed)
Current hospice patient followed by [facility] for COPD.  Patient can come to the [facility] ([address] [place], [place] [address]) for respite care.  Please arrange EMS pick up for 1630. ED RN can call report to [phone] anytime prior to the patient's transport.  Dimas Aguas RN Clinical Nurse Liaison [facility] [phone]

## 2018-10-23 NOTE — ED Provider Notes (Addendum)
Change of shift, care signed out from Dr. Betsey Holiday for overnight care.  This patient has had a CT scan showing multiple abnormalities many of which are chronic but slightly worsened including aortic aneurysm, chronically occluded vasculature etc.  She has a clear chest x-ray and negative Covid sample and a white blood cell count of 12,000.  On my exam of the room the patient is on her baseline oxygen for her chronic COPD, she has 100% oxygenation, slightly hypertensive, temperature of 99.7, pulse of 83 and does not appear to be in distress.  She does have abdominal tenderness.  She does have a urinary tract infection on laboratory work-up and has been given Rocephin with a culture pending.  Evidently her daughter had contracted Covid, was admitted to [facility] but discharged a couple of weeks ago.  She was readmitted for a stroke work-up and was found to have an acute stroke, spent a day or 2 in the hospital and has been home for the last 6 days.  I have tried to contact the patient's daughter this morning without any success.  We will consult with vascular surgery regarding abdominal aortic aneurysm, the patient appears to be comfortable at this time however I question her ability to go home without increased level of care especially since her daughter has had a recent stroke and may have significant deficits.  We will consult with social work for assistance  Discussed with the hospitalist who is willing to see the patient in consultation, Dr. Oneida Alar at [facility] with the vascular service discussed the case with me as well, it is unclear whether this patient would be a interventional or surgical candidate at all however given her decline in status and need for admission overnight she will be transferred to [facility] on the hospitalist service with vascular consultation.  I appreciate Dr. Manuella Ghazi in his willingness to see and admit the patient.  Additional information has come to surface, I was  able to get a hold of the daughter who states that the patient does have a DO NOT RESUSCITATE order, she does not think that she would be a good surgical fit, I agree that the patient is frail and has multiple comorbidities which make it very difficult to be aggressive about the aneurysm.  She is also aware that if the aneurysm ruptured Ms. Sergeant would die and she states that would be an expected complication of having that diagnosis and is okay with that.  She states that she has home hospice and will be able to go into an inpatient hospice setting temporarily, we will try to arrange this, the patient at this time does not appear to need admission to the hospital as she will have adequate services arranged through this hospice hopefully.  10:25 AM  The pt has been accepted at inpatient hospice - will d/c home  12:47 PM   Final diagnoses:  Generalized abdominal pain  Chest pain, unspecified type  Exposure to COVID-19 virus  Cough  Abdominal aortic aneurysm (AAA) without rupture (HCC)      Noemi Chapel, MD 10/23/18 [id number]    Noemi Chapel, MD 10/23/18 1025    Noemi Chapel, MD 10/23/18 1248

## 2018-10-23 NOTE — ED Notes (Signed)
Called EMS sceduler(Rebecca) to arrange transport to [facility] in [place]

## 2018-10-23 NOTE — Discharge Instructions (Signed)
Your testing shows that you have a urinary tract infection Please take Keflex twice daily for the next 7 days Seek medical exam for severe or worsening symptoms You do have a slightly enlarging aneurysm of your abdomen however unfortunately you are not a candidate for surgery, this may continue to get worse Have your family doctor follow this with you. You should be seen by your family doctor within the next several days.

## 2018-10-23 NOTE — ED Notes (Addendum)
Spoke with Thomes Dinning from [facility] (formerly hospice) regarding pt's care. He can be reached at [phone]. States pt's daughter is her caretaker who recently had a stroke and is scheduled for an endarterectomy and is also positive covid.  He verified pt is a DNR in their system and her daughter should have a hard copy at home.

## 2018-10-23 NOTE — ED Notes (Signed)
Spoke again with Marilynne Halsted with [facility] called and states pt will be discharged from here to hospice of [facility] and they will arrange transport. Daughter will bring her medications by here to go with her. Nurse will call for nurse to nurse report. Primary nurse and MD made aware of plan.

## 2018-10-25 LAB — URINE CULTURE: Culture: >=100000 — AB

## 2018-10-26 ENCOUNTER — Telehealth (HOSPITAL_BASED_OUTPATIENT_CLINIC_OR_DEPARTMENT_OTHER): Payer: Self-pay

## 2018-10-26 NOTE — Telephone Encounter (Signed)
Post ED Visit - Positive Culture Follow-up  Culture report reviewed by antimicrobial stewardship pharmacist: [place] Team []  Elenor Quinones, Pharm.D. []  Heide Guile, Pharm.D., BCPS AQ-ID []  Parks Neptune, Pharm.D., BCPS []  Alycia Rossetti, Pharm.D., BCPS []  Leaf, Pharm.D., BCPS, AAHIVP []  Legrand Como, Pharm.D., BCPS, AAHIVP []  Salome Arnt, PharmD, BCPS []  Johnnette Gourd, PharmD, BCPS []  Hughes Better, PharmD, BCPS []  Leeroy Cha, PharmD []  Laqueta [place], PharmD, BCPS []  Albertina Parr, PharmD X Mimi Pham, [place] Team []  Leodis Sias, PharmD []  [place] Spar, PharmD []  Royetta Asal, PharmD []  Graylin Shiver, Rph []  Rema Fendt) Glennon Mac, PharmD []  Arlyn Dunning, PharmD []  Netta Cedars, PharmD []  Dia Sitter, PharmD []  Leone Haven, PharmD []  Gretta Arab, PharmD []  Theodis Shove, PharmD []  Peggyann Juba, PharmD []  Reuel Boom, PharmD   Positive urine culture >/= 100,000 colonies -> E Coli Treated with Cephalexin, organism sensitive to the same and no further patient follow-up is required at this time.  Dortha Kern 10/26/2018, 10:07 AM

## 2018-11-01 ENCOUNTER — Other Ambulatory Visit: Payer: Self-pay | Admitting: Gastroenterology

## 2019-02-05 DEATH — deceased
